# Patient Record
Sex: Female | Born: 1975
Health system: Southern US, Community
[De-identification: ages and names within clinical notes are randomized; demographics above are authoritative.]

## PROBLEM LIST (undated history)

## (undated) DIAGNOSIS — E7800 Pure hypercholesterolemia, unspecified: Secondary | ICD-10-CM

## (undated) DIAGNOSIS — Z8659 Personal history of other mental and behavioral disorders: Secondary | ICD-10-CM

## (undated) DIAGNOSIS — F5089 Other specified eating disorder: Secondary | ICD-10-CM

## (undated) DIAGNOSIS — M719 Bursopathy, unspecified: Secondary | ICD-10-CM

## (undated) DIAGNOSIS — R5383 Other fatigue: Secondary | ICD-10-CM

## (undated) DIAGNOSIS — G43909 Migraine, unspecified, not intractable, without status migrainosus: Secondary | ICD-10-CM

## (undated) DIAGNOSIS — E119 Type 2 diabetes mellitus without complications: Secondary | ICD-10-CM

## (undated) HISTORY — PX: MOUTH SURGERY: SHX715

## (undated) HISTORY — PX: WISDOM TOOTH EXTRACTION: SHX21

## (undated) HISTORY — DX: Other fatigue: R53.83

## (undated) HISTORY — DX: Pure hypercholesterolemia, unspecified: E78.00

## (undated) HISTORY — DX: Migraine, unspecified, not intractable, without status migrainosus: G43.909

## (undated) HISTORY — DX: Other specified eating disorder: F50.89

## (undated) HISTORY — DX: Personal history of other mental and behavioral disorders: Z86.59

## (undated) HISTORY — DX: Type 2 diabetes mellitus without complications: E11.9

---

## 2001-11-12 ENCOUNTER — Other Ambulatory Visit: Admission: RE | Admit: 2001-11-12 | Discharge: 2001-11-12 | Payer: Self-pay | Admitting: Obstetrics & Gynecology

## 2004-05-12 ENCOUNTER — Encounter (INDEPENDENT_AMBULATORY_CARE_PROVIDER_SITE_OTHER): Payer: Self-pay | Admitting: Specialist

## 2004-05-12 ENCOUNTER — Ambulatory Visit (HOSPITAL_COMMUNITY): Admission: RE | Admit: 2004-05-12 | Discharge: 2004-05-12 | Payer: Self-pay | Admitting: Obstetrics & Gynecology

## 2005-03-22 ENCOUNTER — Inpatient Hospital Stay (HOSPITAL_COMMUNITY): Admission: AD | Admit: 2005-03-22 | Discharge: 2005-03-22 | Payer: Self-pay | Admitting: Obstetrics

## 2005-03-22 ENCOUNTER — Ambulatory Visit (HOSPITAL_COMMUNITY): Admission: RE | Admit: 2005-03-22 | Discharge: 2005-03-22 | Payer: Self-pay | Admitting: Obstetrics & Gynecology

## 2005-08-03 ENCOUNTER — Inpatient Hospital Stay (HOSPITAL_COMMUNITY): Admission: AD | Admit: 2005-08-03 | Discharge: 2005-08-06 | Payer: Self-pay | Admitting: Obstetrics & Gynecology

## 2005-08-11 ENCOUNTER — Inpatient Hospital Stay (HOSPITAL_COMMUNITY): Admission: AD | Admit: 2005-08-11 | Discharge: 2005-08-11 | Payer: Self-pay | Admitting: Obstetrics & Gynecology

## 2009-01-14 ENCOUNTER — Ambulatory Visit (HOSPITAL_COMMUNITY): Admission: RE | Admit: 2009-01-14 | Discharge: 2009-01-14 | Payer: Self-pay | Admitting: Obstetrics & Gynecology

## 2009-05-28 ENCOUNTER — Inpatient Hospital Stay (HOSPITAL_COMMUNITY): Admission: AD | Admit: 2009-05-28 | Discharge: 2009-06-01 | Payer: Self-pay | Admitting: Obstetrics & Gynecology

## 2009-05-28 ENCOUNTER — Ambulatory Visit: Payer: Self-pay | Admitting: Obstetrics and Gynecology

## 2009-05-29 ENCOUNTER — Encounter: Payer: Self-pay | Admitting: Obstetrics

## 2009-10-07 ENCOUNTER — Emergency Department (HOSPITAL_COMMUNITY): Admission: EM | Admit: 2009-10-07 | Discharge: 2009-10-07 | Payer: Self-pay | Admitting: Emergency Medicine

## 2010-10-06 LAB — CBC
HCT: 24.3 % — ABNORMAL LOW (ref 36.0–46.0)
HCT: 35.8 % — ABNORMAL LOW (ref 36.0–46.0)
Hemoglobin: 12 g/dL (ref 12.0–15.0)
Hemoglobin: 8.2 g/dL — ABNORMAL LOW (ref 12.0–15.0)
MCHC: 33.5 g/dL (ref 30.0–36.0)
RBC: 2.82 MIL/uL — ABNORMAL LOW (ref 3.87–5.11)
RDW: 14.8 % (ref 11.5–15.5)
WBC: 12.8 10*3/uL — ABNORMAL HIGH (ref 4.0–10.5)

## 2010-10-06 LAB — RPR: RPR Ser Ql: NONREACTIVE

## 2010-11-19 NOTE — Op Note (Signed)
NAME:  NOELA, BROTHERS NO.:  [id number]   MEDICAL RECORD NO.:  [id number]          PATIENT TYPE:  INP   LOCATION:  9167                          FACILITY:  [facility]   PHYSICIAN:  Roseanna Rainbow, M.D.DATE OF BIRTH:  [birth year]   DATE OF PROCEDURE:  08/04/2005  DATE OF DISCHARGE:                                 OPERATIVE REPORT   DELIVERY NOTE:   DELIVERY NOTE:  The patient was fully dilated and pushing, direct OA.  There  was slow crowning.  The head restituted to LOA.  There was difficulty with  delivery of the anterior shoulder.  The patient was placed in [place].  Suprapubic pressure was given as well as a Rubin maneuver was performed.  This was followed by a modified Joseph Art, and there was subsequent delivery of  the anterior shoulder.  Neonatology was called.  The cord was clamped and  cut.  The infant was taken over to the warmer.  The placenta delivered  spontaneously intact with three-vessel cord.  There was a first degree  perineal laceration that was repaired with 3-0 Vicryl Rapide.  The estimated  blood loss was less than 500 mL.  The mother and infant were in stable  condition.      Roseanna Rainbow, M.D.  Electronically Signed     LAJ/MEDQ  D:  08/04/2005  T:  08/04/2005  Job:  [id number]

## 2010-11-19 NOTE — Op Note (Signed)
Sheila James, Sheila James               ACCOUNT NO.:  [id number]   MEDICAL RECORD NO.:  [id number]          PATIENT TYPE:  AMB   LOCATION:  SDC                           FACILITY:  [facility]   PHYSICIAN:  Genia Del, M.D.DATE OF BIRTH:  1976/01/22   DATE OF PROCEDURE:  05/12/2004  DATE OF DISCHARGE:                                 OPERATIVE REPORT   PREOPERATIVE DIAGNOSES:  10 plus weeks nondesired.   POSTOPERATIVE DIAGNOSES:  10 plus weeks nondesired.   INTERVENTION:  Dilatation and evacuation.   SURGEON:  Genia Del, M.D.   ANESTHESIOLOGIST:  Quillian Quince, M.D.   DESCRIPTION OF PROCEDURE:  Under general anesthesia with mask, the patient  is in lithotomy position, she is prepped with Hibiclens in the suprapubic,  vulvar and vaginal areas.  The vaginal exam reveals an anteverted uterus  about 10 weeks, mobile, no adnexal mass. The cervix is closed, no vaginal  bleeding. The speculum is inserted in the vagina, the anterior lip of the  cervix is grasped with a tenaculum, the paracervical block is done with  lidocaine 1%, 20 mL total at 4 and 8 o'clock. We then proceed with  dilatation of the cervix with Hegar dilators up to #33 without difficulty.  We use a #9 curved curette to suction the intrauterine cavity, products of  conception corresponding to about 10 weeks are brought out and sent to  pathology. The sharp curette is then used gently to curettage the entire  intrauterine cavity on all surfaces. The uterine sound is heard. We then go  back with the suction curette to assure complete evacuation of the  intrauterine cavity, products of conception and blood clots are removed. The  uterus contracts well. All instruments are removed. The estimated blood loss  was less than 50 mL, no complication occurred and the patient was brought to  the recovery room in good status. Her blood group with Rh are pending.  If  Rh negative, RhoGAM will be given.      ML/MEDQ  D:   05/12/2004  T:  05/12/2004  Job:  [id number]

## 2010-12-23 ENCOUNTER — Emergency Department (HOSPITAL_COMMUNITY): Payer: Self-pay

## 2010-12-23 ENCOUNTER — Emergency Department (HOSPITAL_COMMUNITY)
Admission: EM | Admit: 2010-12-23 | Discharge: 2010-12-24 | Disposition: A | Discharge status: Home / Self Care | Payer: Self-pay | Attending: Emergency Medicine | Admitting: Emergency Medicine

## 2010-12-23 DIAGNOSIS — M79609 Pain in unspecified limb: Secondary | ICD-10-CM | POA: Insufficient documentation

## 2010-12-23 DIAGNOSIS — M76899 Other specified enthesopathies of unspecified lower limb, excluding foot: Secondary | ICD-10-CM | POA: Insufficient documentation

## 2010-12-23 DIAGNOSIS — M25559 Pain in unspecified hip: Secondary | ICD-10-CM | POA: Insufficient documentation

## 2011-11-11 ENCOUNTER — Emergency Department (HOSPITAL_COMMUNITY): Payer: Self-pay

## 2011-11-11 ENCOUNTER — Encounter (HOSPITAL_COMMUNITY): Payer: Self-pay | Admitting: *Deleted

## 2011-11-11 ENCOUNTER — Emergency Department (HOSPITAL_COMMUNITY)
Admission: EM | Admit: 2011-11-11 | Discharge: 2011-11-12 | Disposition: A | Discharge status: Home / Self Care | Payer: Self-pay | Attending: Emergency Medicine | Admitting: Emergency Medicine

## 2011-11-11 DIAGNOSIS — R269 Unspecified abnormalities of gait and mobility: Secondary | ICD-10-CM | POA: Insufficient documentation

## 2011-11-11 DIAGNOSIS — IMO0002 Reserved for concepts with insufficient information to code with codable children: Secondary | ICD-10-CM | POA: Insufficient documentation

## 2011-11-11 DIAGNOSIS — S93409A Sprain of unspecified ligament of unspecified ankle, initial encounter: Secondary | ICD-10-CM | POA: Insufficient documentation

## 2011-11-11 DIAGNOSIS — M25473 Effusion, unspecified ankle: Secondary | ICD-10-CM | POA: Insufficient documentation

## 2011-11-11 DIAGNOSIS — J45909 Unspecified asthma, uncomplicated: Secondary | ICD-10-CM | POA: Insufficient documentation

## 2011-11-11 DIAGNOSIS — M25579 Pain in unspecified ankle and joints of unspecified foot: Secondary | ICD-10-CM | POA: Insufficient documentation

## 2011-11-11 DIAGNOSIS — M25476 Effusion, unspecified foot: Secondary | ICD-10-CM | POA: Insufficient documentation

## 2011-11-11 HISTORY — DX: Bursopathy, unspecified: M71.9

## 2011-11-11 NOTE — ED Notes (Signed)
Back from xray

## 2011-11-11 NOTE — ED Provider Notes (Signed)
History     CSN: [id number]  Arrival date & time 11/11/11  2059   First MD Initiated Contact with Patient 11/11/11 2210      Chief Complaint  Patient presents with  . Ankle Pain    (Consider location/radiation/quality/duration/timing/severity/associated sxs/prior treatment) HPI Comments: Patient here with right ankle pain s/p fall - states that she initially injured the ankle 2 weeks ago. States that she twisted it and had pain to the right Achilles tendon. States that she babied the ankle at home - states that today while playing with her son, she re-injured the ankle - reports pain and swelling to the posterior portion of the ankle - reports pain with ambulation but is able to ambulate on the ankle - denies numbness or tingling  Patient is a 36 y.o. female presenting with ankle pain. The history is provided by the patient. No language interpreter was used.  Ankle Pain  The incident occurred 3 to 5 hours ago. The incident occurred at home. The injury mechanism was a direct blow. The pain is present in the right ankle. The quality of the pain is described as throbbing. The pain is at a severity of 7/10. The pain is severe. The pain has been constant since onset. Pertinent negatives include no numbness, no inability to bear weight, no loss of motion, no muscle weakness, no loss of sensation and no tingling. She reports no foreign bodies present. The symptoms are aggravated by bearing weight.    Past Medical History  Diagnosis Date  . Bursitis   . Asthma     Past Surgical History  Procedure Date  . Cesarean section   . Mouth surgery     No family history on file.  History  Substance Use Topics  . Smoking status: Never Smoker   . Smokeless tobacco: Not on file  . Alcohol Use: No    OB History    Grav Para Term Preterm Abortions TAB SAB Ect Mult Living                  Review of Systems  Musculoskeletal: Positive for joint swelling, arthralgias and gait problem.    Neurological: Negative for tingling and numbness.  All other systems reviewed and are negative.    Allergies  Review of patient's allergies indicates no known allergies.  Home Medications  No current outpatient prescriptions on file.  BP 117/61  Pulse 78  Temp(Src) 97.7 F (36.5 C) (Oral)  Resp 18  SpO2 100%  LMP 10/14/2011  Physical Exam  Nursing note and vitals reviewed. Constitutional: She is oriented to person, place, and time. She appears well-developed and well-nourished. No distress.  HENT:  Head: Normocephalic and atraumatic.  Right Ear: External ear normal.  Left Ear: External ear normal.  Nose: Nose normal.  Mouth/Throat: Oropharynx is clear and moist. No oropharyngeal exudate.  Eyes: Conjunctivae are normal. Pupils are equal, round, and reactive to light. No scleral icterus.  Neck: Normal range of motion. Neck supple. No tracheal deviation present. No thyromegaly present.  Cardiovascular: Normal rate, regular rhythm and normal heart sounds.  Exam reveals no gallop and no friction rub.   No murmur heard. Pulmonary/Chest: Effort normal and breath sounds normal. No respiratory distress. She has no wheezes. She has no rales. She exhibits no tenderness.  Abdominal: Soft. Bowel sounds are normal. She exhibits no distension. There is no tenderness.  Musculoskeletal:       Right ankle: She exhibits swelling. She exhibits normal range of motion,  no ecchymosis, no deformity and normal pulse. tenderness. Medial malleolus tenderness found. Achilles tendon exhibits pain. Achilles tendon exhibits no defect.       Feet:  Lymphadenopathy:    She has no cervical adenopathy.  Neurological: She is alert and oriented to person, place, and time. No cranial nerve deficit.  Skin: Skin is warm and dry. No rash noted. No erythema. No pallor.  Psychiatric: She has a normal mood and affect. Her behavior is normal. Judgment and thought content normal.    ED Course  Procedures  (including critical care time)  Labs Reviewed - No data to display Dg Ankle Complete Right  11/11/2011  *RADIOLOGY REPORT*  Clinical Data: Twisting injury.  Pain.  RIGHT ANKLE - COMPLETE 3+ VIEW  Comparison: None.  Findings: Imaged bones, joints and soft tissues appear normal.  IMPRESSION: Negative exam.  Original Report Authenticated By: Bernadene Bell. Maricela Curet, M.D.     Right ankle sprain    MDM  Achilles tendon appears intact and patient has intact dorsiflexion and plantar flexion - placed in ASO and on crutches - she will follow up with Dr. Despina Hick.        Izola Price Ali Molina, [place] 11/12/11 0000

## 2011-11-11 NOTE — ED Notes (Signed)
Pt not in room, pt in xray.  

## 2011-11-11 NOTE — ED Notes (Signed)
Ortho tech paged for splint and crutches 

## 2011-11-11 NOTE — ED Notes (Signed)
Pt injured right ankle 2 weeks ago, states "injured achilles tendon".  Tonight, hit on step and now hurts again, difficult to walk on.

## 2011-11-11 NOTE — ED Notes (Signed)
No answer x1

## 2011-11-12 ENCOUNTER — Encounter (HOSPITAL_COMMUNITY): Payer: Self-pay | Admitting: Emergency Medicine

## 2011-11-12 MED ORDER — HYDROCODONE-ACETAMINOPHEN 5-325 MG PO TABS: 1.0000 | ORAL_TABLET | ORAL | Status: AC | PRN

## 2011-11-12 NOTE — ED Provider Notes (Signed)
Medical screening examination/treatment/procedure(s) were performed by non-physician practitioner and as supervising physician I was immediately available for consultation/collaboration.  Jasmine Awe, MD 11/12/11 [phone]

## 2011-11-12 NOTE — Discharge Instructions (Signed)
Ankle Sprain An ankle sprain is an injury to the strong, fibrous tissues (ligaments) that hold the bones of your ankle joint together.  CAUSES Ankle sprain usually is caused by a fall or by twisting your ankle. People who participate in sports are more prone to these types of injuries.  SYMPTOMS  Symptoms of ankle sprain include:  Pain in your ankle. The pain may be present at rest or only when you are trying to stand or walk.   Swelling.   Bruising. Bruising may develop immediately or within 1 to 2 days after your injury.   Difficulty standing or walking.  DIAGNOSIS  Your caregiver will ask you details about your injury and perform a physical exam of your ankle to determine if you have an ankle sprain. During the physical exam, your caregiver will press and squeeze specific areas of your foot and ankle. Your caregiver will try to move your ankle in certain ways. An X-ray exam may be done to be sure a bone was not broken or a ligament did not separate from one of the bones in your ankle (avulsion).  TREATMENT  Certain types of braces can help stabilize your ankle. Your caregiver can make a recommendation for this. Your caregiver may recommend the use of medication for pain. If your sprain is severe, your caregiver may refer you to a surgeon who helps to restore function to parts of your skeletal system (orthopedist) or a physical therapist. HOME CARE INSTRUCTIONS  Apply ice to your injury for 1 to 2 days or as directed by your caregiver. Applying ice helps to reduce inflammation and pain.  Put ice in a plastic bag.   Place a towel between your skin and the bag.   Leave the ice on for 15 to 20 minutes at a time, every 2 hours while you are awake.   Take over-the-counter or prescription medicines for pain, discomfort, or fever only as directed by your caregiver.   Keep your injured leg elevated, when possible, to lessen swelling.   If your caregiver recommends crutches, use them as  instructed. Gradually, put weight on the affected ankle. Continue to use crutches or a cane until you can walk without feeling pain in your ankle.   If you have a plaster splint, wear the splint as directed by your caregiver. Do not rest it on anything harder than a pillow the first 24 hours. Do not put weight on it. Do not get it wet. You may take it off to take a shower or bath.   You may have been given an elastic bandage to wear around your ankle to provide support. If the elastic bandage is too tight (you have numbness or tingling in your foot or your foot becomes cold and blue), adjust the bandage to make it comfortable.   If you have an air splint, you may blow more air into it or let air out to make it more comfortable. You may take your splint off at night and before taking a shower or bath.   Wiggle your toes in the splint several times per day if you are able.  SEEK MEDICAL CARE IF:   You have an increase in bruising, swelling, or pain.   Your toes feel cold.   Pain relief is not achieved with medication.  SEEK IMMEDIATE MEDICAL CARE IF: Your toes are numb or blue or you have severe pain. MAKE SURE YOU:   Understand these instructions.   Will watch your condition.     Will get help right away if you are not doing well or get worse.  Document Released: 06/20/2005 Document Revised: 06/09/2011 Document Reviewed: 01/23/2008 [facility] Patient Information 2012 [place], [place].Ankle Sprain An ankle sprain happens when the bands of tissue that hold the ankle bones together (ligaments) stretch too much and tear.  HOME CARE   Put ice on the ankle for 15 to 20 minutes, 3 to 4 times a day.   Put ice in a plastic bag.   Place a towel between your skin and the bag.   You may stop icing when the puffiness (swelling) goes down.   Raise (elevate) the injured ankle to lessen puffiness.   Use crutches if your doctor tells you to. Use them until you can walk without pain.   If a plaster  splint was applied:   Rest the plaster splint on nothing harder than a pillow for 24 hours.   Do not put weight on it.   Do not get it wet.   Take it off to shower or bathe.   Follow up with your doctor.   Use an elastic wrap for support. Take the wrap off if the toes lose feeling (numb), tingle, or turn cold or blue.   If an air splint was applied:   Add or release air to make it comfortable.   Take it off at night and to shower and bathe.   Wiggle your toes while wearing the air splint.   Only take medicine as told by your doctor.   Do not drive until your doctor says it is okay.  GET HELP RIGHT AWAY IF:   You have more bruising, puffiness, or pain.   Your toes feel cold.   Your medicine does not help lessen your pain.   You are losing feeling in your toes or they turn blue.   You have severe pain.  MAKE SURE YOU:   Understand these instructions.   Will watch your condition.   Will get help right away if you are not doing well or get worse.  Document Released: 12/07/2007 Document Revised: 06/09/2011 Document Reviewed: 12/07/2007 [facility] Patient Information 2012 [address], [place].

## 2014-04-08 ENCOUNTER — Telehealth: Payer: Self-pay | Admitting: *Deleted

## 2014-04-08 NOTE — Telephone Encounter (Signed)
Patient contacted the office requesting a referral for the headache wellness center. Patient states she has a history of chronic headaches. Patient staets she has had a headache daily for the last 3 weeks which is also associated with nausea.  Attempted to contact patient and advised patient would forward request and someone would cont her with the referral appointment.

## 2014-04-09 ENCOUNTER — Other Ambulatory Visit: Payer: Self-pay | Admitting: Obstetrics

## 2014-04-09 ENCOUNTER — Telehealth: Payer: Self-pay

## 2014-04-09 DIAGNOSIS — G44029 Chronic cluster headache, not intractable: Secondary | ICD-10-CM

## 2014-04-09 NOTE — Telephone Encounter (Signed)
atient has no insurance, yet, could not refer her for headaches - she stated she is getting medicaid and will call us

## 2014-04-09 NOTE — Telephone Encounter (Signed)
Referred to Neurology 

## 2014-04-15 NOTE — Telephone Encounter (Signed)
Attempted to contact patient and left message for patient to contact the office.  

## 2014-04-17 ENCOUNTER — Ambulatory Visit: Payer: Self-pay | Admitting: Obstetrics & Gynecology

## 2014-06-30 ENCOUNTER — Encounter: Payer: Self-pay | Admitting: *Deleted

## 2014-07-01 ENCOUNTER — Encounter: Payer: Self-pay | Admitting: Obstetrics & Gynecology

## 2018-01-15 ENCOUNTER — Encounter: Payer: Self-pay | Admitting: *Deleted

## 2018-01-15 ENCOUNTER — Telehealth: Payer: Self-pay | Admitting: *Deleted

## 2018-01-15 ENCOUNTER — Ambulatory Visit: Payer: 59 | Admitting: Neurology

## 2018-01-15 NOTE — Telephone Encounter (Signed)
Pt no showed new pt appt on 01/15/2018 @ 09:00.

## 2018-01-16 ENCOUNTER — Encounter: Payer: Self-pay | Admitting: Neurology

## 2018-05-04 ENCOUNTER — Telehealth: Payer: Self-pay | Admitting: Psychiatry

## 2018-05-04 MED ORDER — LISDEXAMFETAMINE DIMESYLATE 70 MG PO CAPS: 70.0000 mg | ORAL_CAPSULE | Freq: Every day | ORAL | 0 refills | Status: DC

## 2018-05-04 NOTE — Telephone Encounter (Signed)
Pt called to request refill on Vyvanse. She has not been seen since May and will need to make an apt. I can send in script once she has made an apt and we know where to send script. Please contact her with this information at [phone].

## 2018-05-14 ENCOUNTER — Ambulatory Visit: Payer: Self-pay | Admitting: Psychiatry

## 2018-05-18 ENCOUNTER — Ambulatory Visit: Payer: Self-pay | Admitting: Mental Health

## 2018-06-19 ENCOUNTER — Other Ambulatory Visit: Payer: Self-pay | Admitting: Psychiatry

## 2018-06-19 DIAGNOSIS — F902 Attention-deficit hyperactivity disorder, combined type: Secondary | ICD-10-CM

## 2018-06-19 MED ORDER — LISDEXAMFETAMINE DIMESYLATE 70 MG PO CAPS: 70.0000 mg | ORAL_CAPSULE | Freq: Every day | ORAL | 0 refills | Status: DC

## 2018-06-19 NOTE — Progress Notes (Signed)
Pt resting refill.  Patient has appointment scheduled for January 8.  Refill sent.

## 2018-07-05 ENCOUNTER — Encounter: Payer: Self-pay | Admitting: Mental Health

## 2018-07-05 ENCOUNTER — Ambulatory Visit (INDEPENDENT_AMBULATORY_CARE_PROVIDER_SITE_OTHER): Payer: PRIVATE HEALTH INSURANCE | Admitting: Mental Health

## 2018-07-05 DIAGNOSIS — F331 Major depressive disorder, recurrent, moderate: Secondary | ICD-10-CM | POA: Diagnosis not present

## 2018-07-05 NOTE — Progress Notes (Signed)
      [facility] Counselor/Therapist Progress Note  Patient ID: Sheila James, MRN: [id number],    Date: 07/05/2018  Time Spent: minutes  Treatment Type: Individual Therapy  Reported Symptoms: Depressed mood, Anxious Mood, Panic Attacks, Sleep disturbance, Irritability, Fatigue, Sleep disturbance and Appetite disturbance  Mental Status Exam:  Appearance:   Casual     Behavior:  Appropriate, Sharing, Agitated and worried, prreoccupied, stressed  Motor:  Normal  Speech/Language:   Clear and Coherent  Affect:  Depressed  Mood:  anxious, depressed, irritable and sad  Thought process:  normal  Thought content:    WNL  Sensory/Perceptual disturbances:    WNL  Orientation:  oriented to person, place and time/date  Attention:  Negative  Concentration:  Good  Memory:  WNL  Fund of knowledge:   Good  Insight:    Good  Judgment:   Good  Impulse Control:  Good   Risk Assessment: Danger to Self:  No Self-injurious Behavior: No Danger to Others: No Duty to Warn:no Physical Aggression / Violence:No  Access to Firearms a concern: No  Gang Involvement:No   Subjective:  Has been severly depressed and unable to work or get out of bed. Has two school age boys.Owner of condo threw them out because he was selling the condo and they had to stay in low grade motels or sleep in the car was in the washroom of the storage facility. Exhausted,  Interventions: Cognitive Behavioral Therapy, Solution-Oriented/Positive Psychology, Insight-Oriented and Interpersonal  Diagnosis:   ICD-10-CM   1. Major depressive disorder, recurrent episode, moderate (HCC) F33.1     Plan: Self care - nutrition, restful sleep           Work/life balance           Boundaries/ setting limits           Validation and support  Rosary Lively, [facility]

## 2018-07-11 ENCOUNTER — Ambulatory Visit: Payer: Medicaid Other | Admitting: Psychiatry

## 2018-07-11 ENCOUNTER — Encounter: Payer: Self-pay | Admitting: Psychiatry

## 2018-07-11 VITALS — BP 102/73 | HR 73

## 2018-07-11 DIAGNOSIS — F411 Generalized anxiety disorder: Secondary | ICD-10-CM | POA: Diagnosis not present

## 2018-07-11 DIAGNOSIS — F902 Attention-deficit hyperactivity disorder, combined type: Secondary | ICD-10-CM

## 2018-07-11 DIAGNOSIS — F330 Major depressive disorder, recurrent, mild: Secondary | ICD-10-CM | POA: Diagnosis not present

## 2018-07-11 MED ORDER — LISDEXAMFETAMINE DIMESYLATE 70 MG PO CAPS: 70.0000 mg | ORAL_CAPSULE | Freq: Every day | ORAL | 0 refills | Status: DC

## 2018-07-11 MED ORDER — SERTRALINE HCL 100 MG PO TABS: 200.0000 mg | ORAL_TABLET | Freq: Every day | ORAL | 1 refills | Status: DC

## 2018-07-11 MED ORDER — BUSPIRONE HCL 15 MG PO TABS: 15.0000 mg | ORAL_TABLET | Freq: Two times a day (BID) | ORAL | 1 refills | Status: DC

## 2018-07-11 MED ORDER — BUPROPION HCL ER (XL) 150 MG PO TB24: 150.0000 mg | ORAL_TABLET | Freq: Every day | ORAL | 1 refills | Status: DC

## 2018-07-11 NOTE — Progress Notes (Signed)
ATIANNA James [id number] [birth year] 43 y.o.  Subjective:   Patient ID:  Sheila James is a 43 y.o. (DOB 09-27-1975) female.  Chief Complaint:  Chief Complaint  Patient presents with  . Depression  . ADD  . Anxiety    HPI Sheila James presents to the office today for follow-up of Attention Deficit and anxiety. She reports recent stressors with landlord giving them limited notice that they would need to move. Reports that she has not had finances to relocate, so they have been living in a hotel until she has extra funds from tax refund. Reports that she then was no longer able to work from home and had to have childcare. Reports that she started taking medications only on certain days to stretch medications and make them last. She reports that she did not do as well with taking meds less consistently and then had missed work as a result, which had a negative impact on her finances. Reports depression in December that was concerning to her since she had depression while taking medications. Reports that depression is "better now" and continues to have some residual depressive s/s. She reports that her motivation and energy remain low at times and that she had difficulty getting out of bed. Reports that she was recently able to cook, clean, and grocery shop recently after not being able to do so when more depressed. Reports that she has not been getting an adequate amount of sleep due to multiple responsibilities and plans to try to ensure more regular sleep. She reports that appetite has been decreased with Vyvanse. Reports that she was over-eating while depressed. She reports that she tends to experience anxiety and depression simultaneously and that anxiety has been improved with taking medications regularly. She reports that on 2 days she drove to work and was unable to get herself to go into work due to anxiety. She reports taking a picture of herself when experiencing severe  anxiety and noticed she looked "helpless." Denies SI.   She reports that concentration is significantly improved with Vyvanse. Reports that without it, she was starting multiple tasks and not completing them due to distractibility and was procrastinating. She reports that she is now more productive and pro-active and preparing things for her and her sons the week and night before.   Reports that she has found a possible place for them to live near their old neighborhood.   Contemplating going to grad school for healthcare administration.   Review of Systems:  Review of Systems  Musculoskeletal: Negative for gait problem.  Neurological: Negative for tremors.       Reports HA's/migraines are well controlled when taking Sertraline and other medications regularly.   Psychiatric/Behavioral:       Please refer to HPI   No h/o seizures.    Medications: I have reviewed the patient's current medications.  Current Outpatient Medications  Medication Sig Dispense Refill  . Biotin 10 MG TABS Take 1 tablet by mouth daily.    . busPIRone (BUSPAR) 15 MG tablet Take 1 tablet (15 mg total) by mouth 2 (two) times daily. 180 tablet 1  . ferrous sulfate 325 (65 FE) MG tablet Take 325 mg by mouth daily with breakfast.    . [START ON 07/17/2018] lisdexamfetamine (VYVANSE) 70 MG capsule Take 1 capsule (70 mg total) by mouth daily. 30 capsule 0  . Multiple Vitamin (MULTIVITAMIN) tablet Take 1 tablet by mouth daily.    . sertraline (ZOLOFT) 100 MG tablet Take  2 tablets (200 mg total) by mouth daily. 180 tablet 1  . vitamin E 100 UNIT capsule Take 100 Units by mouth daily.    Marland Kitchen buPROPion (WELLBUTRIN XL) 150 MG 24 hr tablet Take 1 tablet (150 mg total) by mouth daily. 30 tablet 1  . [START ON 08/14/2018] lisdexamfetamine (VYVANSE) 70 MG capsule Take 1 capsule (70 mg total) by mouth daily. 30 capsule 0  . [START ON 09/11/2018] lisdexamfetamine (VYVANSE) 70 MG capsule Take 1 capsule (70 mg total) by mouth daily. 30  capsule 0  . NABUMETONE PO Take 750 mg by mouth.    . naratriptan (AMERGE) 2.5 MG tablet Take 2.5 mg by mouth once as needed for migraine. Once a day as needed     No current facility-administered medications for this visit.     Medication Side Effects: Appetite Suppression  Allergies: No Known Allergies  Past Medical History:  Diagnosis Date  . Asthma   . Bursitis   . H/O: depression   . Migraines   . Pica     Family History  Problem Relation Age of Onset  . Diabetes Mother   . Anxiety disorder Mother   . Diabetes Father   . Hypertension Father   . Asthma Brother   . ADD / ADHD Brother   . Diabetes Brother   . Anxiety disorder Maternal Aunt   . Anxiety disorder Maternal Aunt   . ADD / ADHD Son     Social History   Socioeconomic History  . Marital status: Divorced    Spouse name: Not on file  . Number of children: 2  . Years of education: Not on file  . Highest education level: Not on file  Occupational History  . Occupation: Therapist, art  Social Needs  . Financial resource strain: Not on file  . Food insecurity:    Worry: Not on file    Inability: Not on file  . Transportation needs:    Medical: Not on file    Non-medical: Not on file  Tobacco Use  . Smoking status: Never Smoker  . Smokeless tobacco: Never Used  Substance and Sexual Activity  . Alcohol use: No  . Drug use: Never  . Sexual activity: Not Currently    Partners: Male  Lifestyle  . Physical activity:    Days per week: Not on file    Minutes per session: Not on file  . Stress: Not on file  Relationships  . Social connections:    Talks on phone: Not on file    Gets together: Not on file    Attends religious service: Not on file    Active member of club or organization: Not on file    Attends meetings of clubs or organizations: Not on file    Relationship status: Not on file  . Intimate partner violence:    Fear of current or ex partner: Not on file    Emotionally abused: Not on  file    Physically abused: Not on file    Forced sexual activity: Not on file  Other Topics Concern  . Not on file  Social History Narrative  . Not on file    Past Medical History, Surgical history, Social history, and Family history were reviewed and updated as appropriate.   Please see review of systems for further details on the patient's review from today.   Objective:   Physical Exam:  BP 102/73   Pulse 73   Physical Exam Constitutional:  General: She is not in acute distress.    Appearance: She is well-developed.  Musculoskeletal:        General: No deformity.  Neurological:     Mental Status: She is alert and oriented to person, place, and time.     Coordination: Coordination normal.  Psychiatric:        Mood and Affect: Mood is anxious. Mood is not depressed. Affect is not labile, blunt, angry or inappropriate.        Speech: Speech normal.        Behavior: Behavior normal.        Thought Content: Thought content normal. Thought content does not include homicidal or suicidal ideation. Thought content does not include homicidal or suicidal plan.        Judgment: Judgment normal.     Comments: Insight intact. No auditory or visual hallucinations. No delusions.      Lab Review:  No results found for: NA, K, CL, CO2, GLUCOSE, BUN, CREATININE, CALCIUM, PROT, ALBUMIN, AST, ALT, ALKPHOS, BILITOT, GFRNONAA, GFRAA     Component Value Date/Time   WBC 12.8 (H) 05/30/2009 0528   RBC 2.82 (L) 05/30/2009 0528   HGB 8.2 DELTA CHECK NOTED (L) 05/30/2009 0528   HCT 24.3 (L) 05/30/2009 0528   PLT 188 05/30/2009 0528   MCV 86.3 05/30/2009 0528   MCHC 33.8 05/30/2009 0528   RDW 14.8 05/30/2009 0528    No results found for: POCLITH, LITHIUM   No results found for: PHENYTOIN, PHENOBARB, VALPROATE, CBMZ   .res Assessment: Plan:   Discussed potential benefits, risks, and side effects of Wellbutrin XL since patient reports concerned about recurrence of depression since  recent depression was significant and interfered with her ability to work.  Will start Wellbutrin XL 150 mg p.o. every morning for depression. Continue Vyvanse 70 mg p.o. every morning for attention deficit. Continue sertraline 200 mg daily for depression and anxiety. Continue BuSpar 15 mg twice daily for anxiety. Recommend continuing therapy with Rosary Lively, LPC. Attention deficit hyperactivity disorder (ADHD), combined type - Plan: lisdexamfetamine (VYVANSE) 70 MG capsule, lisdexamfetamine (VYVANSE) 70 MG capsule, lisdexamfetamine (VYVANSE) 70 MG capsule  Mild episode of recurrent major depressive disorder (HCC) - Plan: sertraline (ZOLOFT) 100 MG tablet, buPROPion (WELLBUTRIN XL) 150 MG 24 hr tablet  Generalized anxiety disorder - Plan: busPIRone (BUSPAR) 15 MG tablet, sertraline (ZOLOFT) 100 MG tablet  Please see After Visit Summary for patient specific instructions.  Future Appointments  Date Time Provider [place]  07/26/2018  3:30 PM Rosary Lively, [place] [facility] None  08/08/2018 10:00 AM Thayer Headings, PMHNP CP-CP None    No orders of the defined types were placed in this encounter.     -------------------------------

## 2018-07-12 ENCOUNTER — Encounter: Payer: Self-pay | Admitting: Mental Health

## 2018-07-12 ENCOUNTER — Ambulatory Visit (INDEPENDENT_AMBULATORY_CARE_PROVIDER_SITE_OTHER): Payer: PRIVATE HEALTH INSURANCE | Admitting: Mental Health

## 2018-07-12 DIAGNOSIS — F331 Major depressive disorder, recurrent, moderate: Secondary | ICD-10-CM

## 2018-07-12 NOTE — Progress Notes (Signed)
      [facility] Counselor/Therapist Progress Note  Patient ID: Sheila James, MRN: [id number],    Date: 07/12/2018  Time Spent: 45 minutes  Treatment Type: Individual Therapy  Reported Symptoms: Discouraged, depressed, anxious  Mental Status Exam:  Appearance:   Well Groomed     Behavior:  Appropriate, Sharing and Motivated  Motor:  Normal  Speech/Language:   Normal Rate  Affect:  Depressed  Mood:  anxious, depressed and disappointed, discouraged  Thought process:  normal  Thought content:    WNL  Sensory/Perceptual disturbances:    WNL  Orientation:  oriented to person, place and time/date  Attention:  Good  Concentration:  Good  Memory:  WNL  Fund of knowledge:   Good  Insight:    Good  Judgment:   Good  Impulse Control:  Good   Risk Assessment: Danger to Self:  No Self-injurious Behavior: No Danger to Others: No Duty to Warn:no Physical Aggression / Violence:No  Access to Firearms a concern: No  Gang Involvement:No   Subjective:  Concerned about aggressive behavior  of son. Has been discouraged and depressed. Planning to move into renovated condo early February. Has a relationship currently n [place] that offers hope for the future.  Interventions: Cognitive Behavioral Therapy, Solution-Oriented/Positive Psychology, Insight-Oriented and Interpersonal  Diagnosis:   ICD-10-CM   1. Major depressive disorder, recurrent episode, moderate (HCC) F33.1     Plan: Self care program            Parenting issues: Boundaries/ setting limits            Maintain stable mood            Validation and support  Rosary Lively, [facility]

## 2018-07-16 ENCOUNTER — Telehealth: Payer: Self-pay | Admitting: Psychiatry

## 2018-07-16 NOTE — Telephone Encounter (Signed)
Refer to [employer]

## 2018-07-16 NOTE — Telephone Encounter (Signed)
Pt called back and started the Bupropion 150mg  this past Friday, slight itching Friday night. Very itchy starting Saturday. Thought she had a bug bite first then had more. Did not take any medication today.   Please advise

## 2018-07-16 NOTE — Telephone Encounter (Signed)
Left voice mail to call back 

## 2018-07-16 NOTE — Telephone Encounter (Signed)
This is not my patient as far as I know..  Please contact provider. But stop bupropion.  Add Zyrtec BID with prn Benadryl until rash resolves. If severe sx like SOB, Fever then go to ER.

## 2018-07-16 NOTE — Telephone Encounter (Signed)
Attempted to call pt and no answer. Left detailed message for pt to stop Bupropion XL and to start Zyrtec BID and Benadryl prn until rash resolves. Advised her to go to the ER if having any SOB, respiratory s/s, or swelling of lip/mouth. Advised pt to call office with any questions.

## 2018-07-16 NOTE — Telephone Encounter (Signed)
Patient called concerned that she's having allergic reaction to Bupropion.  Whelps and rash.  Also very itchy. Please call

## 2018-07-26 ENCOUNTER — Encounter: Payer: Self-pay | Admitting: Mental Health

## 2018-07-26 ENCOUNTER — Ambulatory Visit (INDEPENDENT_AMBULATORY_CARE_PROVIDER_SITE_OTHER): Payer: PRIVATE HEALTH INSURANCE | Admitting: Mental Health

## 2018-07-26 DIAGNOSIS — F331 Major depressive disorder, recurrent, moderate: Secondary | ICD-10-CM

## 2018-07-26 NOTE — Progress Notes (Signed)
      [facility] Counselor/Therapist Progress Note  Patient ID: Sheila James, MRN: [id number],    Date: 07/26/2018  Time Spent: 45 minutes   Treatment Type: Individual Therapy  Reported Symptoms: Fatigue and Sleep disturbance  Mental Status Exam:  Appearance:   Casual     Behavior:  Appropriate  Motor:  Normal  Speech/Language:   Clear and Coherent  Affect:  Appropriate  Mood:  euthymic  Thought process:  normal  Thought content:    WNL  Sensory/Perceptual disturbances:    WNL  Orientation:  oriented to person, place and time/date  Attention:  Good  Concentration:  Good  Memory:  WNL  Fund of knowledge:   Good  Insight:    Good  Judgment:   Good  Impulse Control:  Good   Risk Assessment: Danger to Self:  No Self-injurious Behavior: No Danger to Others: No Duty to Warn:no Physical Aggression / Violence:No  Access to Firearms a concern: No  Gang Involvement:No   Subjective: Never has personal time for herself. Very vigilent about her 2 sons and works hard at job. Has had headaches. Doing very well on Wellbutrin after initial problems. Memory and concentration better and staying on track at work and home.  Limits screen time. Not sleeping well.  Interventions: Cognitive Behavioral Therapy, Solution-Oriented/Positive Psychology, Insight-Oriented and Interpersonal  Diagnosis:   ICD-10-CM   1. Major depressive disorder, recurrent episode, moderate (HCC) F33.1     Plan:      Self care program: be sure to eat                Restore restful sleep                Work/life balance                Boundaries/ setting limits with sons        Rosary Lively, [facility]

## 2018-08-02 ENCOUNTER — Telehealth: Payer: Self-pay | Admitting: Psychiatry

## 2018-08-02 NOTE — Telephone Encounter (Signed)
Patient stated 2nd round of Wellbutrin is not working. She is itching and can't wait until the 5th to change medication. Please advise.

## 2018-08-02 NOTE — Telephone Encounter (Signed)
Left voicemail to stop medication if not already, take benadryl as needed along with zyrtec. Will notify provider. Has ov 08/08/2018

## 2018-08-08 ENCOUNTER — Telehealth: Payer: Self-pay | Admitting: Psychiatry

## 2018-08-08 ENCOUNTER — Ambulatory Visit: Payer: PRIVATE HEALTH INSURANCE | Admitting: Psychiatry

## 2018-08-08 DIAGNOSIS — F331 Major depressive disorder, recurrent, moderate: Secondary | ICD-10-CM

## 2018-08-08 NOTE — Telephone Encounter (Signed)
Patient is requesting something to be called in to replace her Wellbutrin. Stated it's not working for her. Please call in to the [facility] on [address].

## 2018-08-09 NOTE — Telephone Encounter (Signed)
Left voice mail

## 2018-08-13 NOTE — Telephone Encounter (Signed)
Left voice mail to call back 

## 2018-08-14 MED ORDER — ARIPIPRAZOLE 5 MG PO TABS
5.0000 mg | ORAL_TABLET | Freq: Every day | ORAL | 0 refills | Status: DC
Start: 2018-08-14 — End: 2018-08-28

## 2018-08-14 NOTE — Telephone Encounter (Signed)
See previous phone messages about wellbutrin, she did try it again just to see if she had same reaction and she did. Extreme itching all over.  Put on cxl list today but no OV till 08/27/2018  Asking for something for depression be submitted to [facility] on [address].

## 2018-08-15 NOTE — Telephone Encounter (Signed)
Left voicemail with information. Also working on [place] for aripiprazole

## 2018-08-16 ENCOUNTER — Ambulatory Visit: Payer: PRIVATE HEALTH INSURANCE | Admitting: Mental Health

## 2018-08-20 NOTE — Telephone Encounter (Signed)
Prior authorization approved for aripiprazole 5mg  tablets

## 2018-08-27 ENCOUNTER — Ambulatory Visit: Payer: PRIVATE HEALTH INSURANCE | Admitting: Psychiatry

## 2018-08-27 ENCOUNTER — Encounter

## 2018-08-28 ENCOUNTER — Ambulatory Visit: Payer: PRIVATE HEALTH INSURANCE | Admitting: Psychiatry

## 2018-08-28 ENCOUNTER — Encounter: Payer: Self-pay | Admitting: Psychiatry

## 2018-08-28 VITALS — BP 121/84 | HR 85

## 2018-08-28 DIAGNOSIS — F330 Major depressive disorder, recurrent, mild: Secondary | ICD-10-CM

## 2018-08-28 DIAGNOSIS — F411 Generalized anxiety disorder: Secondary | ICD-10-CM | POA: Diagnosis not present

## 2018-08-28 DIAGNOSIS — F902 Attention-deficit hyperactivity disorder, combined type: Secondary | ICD-10-CM

## 2018-08-28 DIAGNOSIS — F331 Major depressive disorder, recurrent, moderate: Secondary | ICD-10-CM

## 2018-08-28 MED ORDER — LISDEXAMFETAMINE DIMESYLATE 70 MG PO CAPS: 70.0000 mg | ORAL_CAPSULE | Freq: Every day | ORAL | 0 refills | Status: DC

## 2018-08-28 MED ORDER — ARIPIPRAZOLE 5 MG PO TABS: 5.0000 mg | ORAL_TABLET | Freq: Every day | ORAL | 3 refills | Status: DC

## 2018-08-28 NOTE — Progress Notes (Signed)
[name] [id number] Mar 04, 1976 43 y.o.  Subjective:   Patient ID:  Sheila James is a 43 y.o. (DOB Nov 23, 1975) female.  Chief Complaint:  Chief Complaint  Patient presents with  . Depression  . ADD  . Follow-up    h/o anxiety    HPI [name] presents to the office today for follow-up of depression, anxiety, and ADD. Wellbutrin XL was started at last visit and had allergic reaction to include hives and severe itching. She contacted office and was advised to stop Wellbutrin XL, start antihistamines, and seek medical care if having worsening s/s. Reports that she stopped Wellbutrin XL and later re-started to confirm that reaction was related to Wellbutrin XL and she then developed hives and itching again and stopped Wellbutrin XL. Pt contacted office to request another medication for tx of depression due to severe depression that was interfering with her ability to work due to severely low energy and motivation. She reports at that time she was sleeping 1.5-3 hours around that time.   Reports that she started Abilify on 08/15/18 "and I think it is working fine. I don't feel depressed." She has had some recent cold s/s that have interfered with her sleep. She was able to get more sleep last weekend when her children went to stay with her cousin. She reports that her sleep has improved since starting Abilify. Reports some recent anxiety in response to stressors. Denies any recent panic attacks. Reports that she had increased anxiety with return to work.  Reports that she had concentration difficulties last week and was losing track of her thoughts and what she was just working on and needed to do. She reports some improvement in concentration this week and thinks that concentration issues are related to stress, feeling overwhelmed by her responsibilities, and trying to get organized. She reports that overall she is more organized and has been able to prioritize finances.  Reports improved energy and motivation and was able to be more productive last weekend. Some increase in appetite. Denies SI.   Recent financial stress with a paycheck being several hundred less than she had expected. She and her children have recently been sick. Has started trying to do some yoga and meditation. Reports that she is trying to work on self-care. Both of her sons are with her all the time now and they are frequently seeking attention.   Past Psychiatric Medication Trials: abilify- helpful for depression. Wellbutrin- allergic reaction.  Sertraline Buspar Adderall XR Vyvanse  Review of Systems:  Review of Systems  HENT: Positive for congestion and postnasal drip.        Recent URI  Musculoskeletal: Negative for gait problem.  Neurological: Negative for tremors.    Medications: I have reviewed the patient's current medications.  Current Outpatient Medications  Medication Sig Dispense Refill  . ARIPiprazole (ABILIFY) 5 MG tablet Take 1 tablet (5 mg total) by mouth daily for 30 days. 30 tablet 3  . Biotin 10 MG TABS Take 1 tablet by mouth daily.    . busPIRone (BUSPAR) 15 MG tablet Take 1 tablet (15 mg total) by mouth 2 (two) times daily. 180 tablet 1  . ferrous sulfate 325 (65 FE) MG tablet Take 325 mg by mouth daily with breakfast.    . lisdexamfetamine (VYVANSE) 70 MG capsule Take 1 capsule (70 mg total) by mouth daily. 30 capsule 0  . [START ON 09/11/2018] lisdexamfetamine (VYVANSE) 70 MG capsule Take 1 capsule (70 mg total) by mouth daily.  30 capsule 0  . Multiple Vitamin (MULTIVITAMIN) tablet Take 1 tablet by mouth daily.    Marland Kitchen NABUMETONE PO Take 750 mg by mouth.    . naratriptan (AMERGE) 2.5 MG tablet Take 2.5 mg by mouth once as needed for migraine. Once a day as needed    . sertraline (ZOLOFT) 100 MG tablet Take 2 tablets (200 mg total) by mouth daily. 180 tablet 1  . vitamin E 100 UNIT capsule Take 100 Units by mouth daily.    Derrill Memo ON 10/09/2018]  lisdexamfetamine (VYVANSE) 70 MG capsule Take 1 capsule (70 mg total) by mouth daily for 30 days. 30 capsule 0   No current facility-administered medications for this visit.     Medication Side Effects: Other: Some drowsiness with Abilify and changed admin time to HS  Allergies:  Allergies  Allergen Reactions  . Wellbutrin Xl [Bupropion]     Past Medical History:  Diagnosis Date  . Asthma   . Bursitis   . H/O: depression   . Migraines   . Pica     Family History  Problem Relation Age of Onset  . Diabetes Mother   . Anxiety disorder Mother   . Diabetes Father   . Hypertension Father   . Asthma Brother   . ADD / ADHD Brother   . Diabetes Brother   . Anxiety disorder Maternal Aunt   . Anxiety disorder Maternal Aunt   . ADD / ADHD Son     Social History   Socioeconomic History  . Marital status: Divorced    Spouse name: Not on file  . Number of children: 2  . Years of education: Not on file  . Highest education level: Not on file  Occupational History  . Occupation: Therapist, art  Social Needs  . Financial resource strain: Not on file  . Food insecurity:    Worry: Not on file    Inability: Not on file  . Transportation needs:    Medical: Not on file    Non-medical: Not on file  Tobacco Use  . Smoking status: Never Smoker  . Smokeless tobacco: Never Used  Substance and Sexual Activity  . Alcohol use: No  . Drug use: Never  . Sexual activity: Not Currently    Partners: Male  Lifestyle  . Physical activity:    Days per week: Not on file    Minutes per session: Not on file  . Stress: Not on file  Relationships  . Social connections:    Talks on phone: Not on file    Gets together: Not on file    Attends religious service: Not on file    Active member of club or organization: Not on file    Attends meetings of clubs or organizations: Not on file    Relationship status: Not on file  . Intimate partner violence:    Fear of current or ex partner: Not  on file    Emotionally abused: Not on file    Physically abused: Not on file    Forced sexual activity: Not on file  Other Topics Concern  . Not on file  Social History Narrative  . Not on file    Past Medical History, Surgical history, Social history, and Family history were reviewed and updated as appropriate.   Please see review of systems for further details on the patient's review from today.   Objective:   Physical Exam:  BP 121/84   Pulse 85   Physical Exam  Constitutional:      General: She is not in acute distress.    Appearance: She is well-developed.  Musculoskeletal:        General: No deformity.  Neurological:     Mental Status: She is alert and oriented to person, place, and time.     Coordination: Coordination normal.  Psychiatric:        Attention and Perception: Attention and perception normal. She does not perceive auditory or visual hallucinations.        Mood and Affect: Mood is not depressed. Affect is not labile, blunt, angry or inappropriate.        Speech: Speech normal.        Behavior: Behavior normal.        Thought Content: Thought content normal. Thought content does not include homicidal or suicidal ideation. Thought content does not include homicidal or suicidal plan.        Cognition and Memory: Cognition and memory normal.        Judgment: Judgment normal.     Comments: Mood presents as slightly anxious. Insight intact. No delusions.      Lab Review:  No results found for: NA, K, CL, CO2, GLUCOSE, BUN, CREATININE, CALCIUM, PROT, ALBUMIN, AST, ALT, ALKPHOS, BILITOT, GFRNONAA, GFRAA     Component Value Date/Time   WBC 12.8 (H) 05/30/2009 0528   RBC 2.82 (L) 05/30/2009 0528   HGB 8.2 DELTA CHECK NOTED (L) 05/30/2009 0528   HCT 24.3 (L) 05/30/2009 0528   PLT 188 05/30/2009 0528   MCV 86.3 05/30/2009 0528   MCHC 33.8 05/30/2009 0528   RDW 14.8 05/30/2009 0528    No results found for: POCLITH, LITHIUM   No results found for:  PHENYTOIN, PHENOBARB, VALPROATE, CBMZ   .res Assessment: Plan:   Discussed potential benefits, risks, and side effects of Abilify. Discussed potential metabolic side effects associated with atypical antipsychotics, as well as potential risk for movement side effects. Advised pt to contact office if movement side effects occur.  Will continue Abilify 5 mg daily since patient reports that this has been significantly helpful for her depressive signs and symptoms. Will continue sertraline 200 mg daily since this has been effective for mood and anxiety. Will continue Vyvanse 70 mg p.o. every morning for attention deficit. Will continue BuSpar 15 mg twice daily for anxiety. Recommend continuing psychotherapy with Rosary Lively, LPC. Patient to follow-up with this provider in 4 weeks or sooner.  Mild episode of recurrent major depressive disorder (HCC)  Attention deficit hyperactivity disorder (ADHD), combined type - Plan: lisdexamfetamine (VYVANSE) 70 MG capsule  Generalized anxiety disorder  Major depressive disorder, recurrent episode, moderate ([place]) - Plan: ARIPiprazole (ABILIFY) 5 MG tablet  Please see After Visit Summary for patient specific instructions.  Future Appointments  Date Time Provider [place]  09/03/2018  9:00 AM Rosary Lively, LPC CP-CP None  09/26/2018  9:00 AM Melvenia Beam, MD [facility] None  09/27/2018  9:30 AM Thayer Headings, PMHNP CP-CP None  09/27/2018 11:00 AM Rosary Lively, LPC CP-CP None    No orders of the defined types were placed in this encounter.     -------------------------------

## 2018-09-03 ENCOUNTER — Ambulatory Visit (INDEPENDENT_AMBULATORY_CARE_PROVIDER_SITE_OTHER): Payer: PRIVATE HEALTH INSURANCE | Admitting: Mental Health

## 2018-09-03 ENCOUNTER — Encounter: Payer: Self-pay | Admitting: Mental Health

## 2018-09-03 DIAGNOSIS — F331 Major depressive disorder, recurrent, moderate: Secondary | ICD-10-CM

## 2018-09-03 NOTE — Progress Notes (Signed)
      [facility] Counselor/Therapist Progress Note  Patient ID: Sheila James, MRN: [id number],    Date: 09/03/2018  Time Spent: 45 minutes  Treatment Type: Individual Therapy  Reported Symptoms: Anxious  when goes into the office. Not able to be productive at home.  Denies feeling depressed.  Sleep I much better. Concerned about weight gain from Abilify. Believes she is gaining five pounds a week from Abilify. Doing a good job with self care. 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  Mental Status Exam:  Appearance:   Casual     Behavior:  Appropriate and Sharing  Motor:  Normal  Speech/Language:   Clear and Coherent  Affect:  Anxious about work  Mood:  euthymic  Thought process:  normal  Thought content:    WNL  Sensory/Perceptual disturbances:    WNL  Orientation:  oriented to person, place and time/date  Attention:  Good  Concentration:  Good  Memory:  WNL  Fund of knowledge:   Good  Insight:    Good  Judgment:   Good  Impulse Control:  Good   Risk Assessment: Danger to Self:  No Self-injurious Behavior: No Danger to Others: No Duty to Warn:no Physical Aggression / Violence:No  Access to Firearms a concern: No  Gang Involvement:No   Subjective:  Euthymic mood. Looking forward to visit from [employer]. Working out of the office for now. Living in Extended Stay [place] for now. Denies depression but reports anxiety at the ofice. Gaining weight on Abilify distresses her.  Interventions: Cognitive Behavioral Therapy, Solution-Oriented/Positive Psychology, Insight-Oriented and Interpersonal  Diagnosis:   ICD-10-CM   1. Major depressive disorder, recurrent episode, moderate (HCC) F33.1     Plan:  Self care program            Work/life balance            Boundaries/ setting limits            Validation and support  Rosary Lively, [facility]

## 2018-09-26 ENCOUNTER — Ambulatory Visit: Payer: Self-pay | Admitting: Neurology

## 2018-09-27 ENCOUNTER — Ambulatory Visit (INDEPENDENT_AMBULATORY_CARE_PROVIDER_SITE_OTHER): Payer: No Typology Code available for payment source | Admitting: Mental Health

## 2018-09-27 ENCOUNTER — Ambulatory Visit: Payer: PRIVATE HEALTH INSURANCE | Admitting: Psychiatry

## 2018-09-27 ENCOUNTER — Other Ambulatory Visit: Payer: Self-pay

## 2018-09-27 DIAGNOSIS — F331 Major depressive disorder, recurrent, moderate: Secondary | ICD-10-CM | POA: Diagnosis not present

## 2018-09-27 DIAGNOSIS — F411 Generalized anxiety disorder: Secondary | ICD-10-CM

## 2018-09-27 NOTE — Addendum Note (Signed)
Addended by: Logan Bores on: 09/27/2018 01:59 PM   Modules accepted: Level of Service

## 2018-09-27 NOTE — Progress Notes (Signed)
[facility] Counselor/Therapist Progress Note  Patient ID: DAN DISSINGER, MRN: [id number],    Date: 09/27/2018    I connected with patient by a video enabled telemedicine application or telephone, with their informed consent, and  verified patient privacy and that I am speaking with the correct person using two identifiers.  I was located at homeand patient at home. Telephone session due to [place].   Time Spent: 50 minutes  Treatment Type: Individual Therapy  Reported Symptoms: Frustrated and stressed. Disappointed that she has to cancel her birthday plans to got to [place] she has been looking forward to because of the pandemic. Trying to manage her two boys with asthma at home.  Mental Status Exam:  Appearance:   unseen - telephone session     Behavior:  Appropriate, Sharing and Motivated  Motor:  unseen - telephone session  Speech/Language:   Pressured  Affect:  stressed  Mood:  anxious and disappointed  Thought process:  normal  Thought content:    WNL  Sensory/Perceptual disturbances:    WNL  Orientation:  oriented to person, place and time/date  Attention:  Good  Concentration:  Good  Memory:  WNL  Fund of knowledge:   Good  Insight:    Good  Judgment:   Good  Impulse Control:  Good   Risk Assessment: Danger to Self:  No Self-injurious Behavior: No Danger to Others: No Duty to Warn:no Physical Aggression / Violence:No  Access to Firearms a concern: No  Gang Involvement:No   Subjective: On short term disability during this week since she has to be at home with her two sons who have asthma.  Then hopefully can return to working at home instead of out of the office. Had to cancel birthday trip to [place] due to Pandemic. Isolated. Trying to maintain hopeful outlook. Staying in Extended Stay until further notice. Has been depressed but trying to reverse that with nutrition. Limited in opportunity to exercise.  Interventions: Cognitive  Behavioral Therapy, Solution-Oriented/Positive Psychology and Insight-Oriented  Diagnosis:  F33.1  Major depression    Treatment Plan   Patient Name:  Sheila James   Date: September 27, 2018   Didactic topic to be discussed:           Anxiety:                   Locus of control                              Work/Life balance           Depression                             Problem-solving                              Relationships                                   Boundaries                                     Coping srategies  Communication                    Recovery from trauma                    Self-care                                     Validation  Other: Single Parenting     Goals:  Patient  1. Maintains mood stabiity:  decreased symptoms of     depression     anxiety  2.   Practices pro-active self-care:   restful sleep, nutrition, exercise, socialization  3.   Effective utilizes boundaries and sets limits  4.   Utliizes coping strategies and problem solving techniques for stress management  5.   Feels accurately heard, understood and validated  Other: Manage single parenting issues      Logan Bores [facility]

## 2018-09-29 NOTE — Progress Notes (Deleted)
Unable to reach pt at time of scheduled tele-visit. Attempted to reach pt x 3 and there was not answer.

## 2018-10-02 NOTE — Progress Notes (Signed)
Unable to reach pt at scheduled time.

## 2018-10-11 ENCOUNTER — Other Ambulatory Visit: Payer: Self-pay

## 2018-10-11 ENCOUNTER — Ambulatory Visit: Payer: PRIVATE HEALTH INSURANCE | Admitting: Mental Health

## 2018-10-11 ENCOUNTER — Ambulatory Visit: Payer: PRIVATE HEALTH INSURANCE | Admitting: Psychiatry

## 2018-11-30 ENCOUNTER — Other Ambulatory Visit: Payer: Self-pay

## 2018-11-30 ENCOUNTER — Telehealth: Payer: Self-pay | Admitting: Psychiatry

## 2018-11-30 DIAGNOSIS — F902 Attention-deficit hyperactivity disorder, combined type: Secondary | ICD-10-CM

## 2018-11-30 NOTE — Telephone Encounter (Signed)
Patent called and would like to have a refill on her vyvanse 70 mg to be sent to [facility] on [address]. She scheduled an appointment for 6/22

## 2018-11-30 NOTE — Telephone Encounter (Signed)
Pended for approval.

## 2018-12-03 MED ORDER — LISDEXAMFETAMINE DIMESYLATE 70 MG PO CAPS: 70.0000 mg | ORAL_CAPSULE | Freq: Every day | ORAL | 0 refills | Status: DC

## 2018-12-07 ENCOUNTER — Telehealth: Payer: Self-pay | Admitting: Psychiatry

## 2018-12-07 NOTE — Telephone Encounter (Signed)
Does not like new medication, for depression, and it made her gain considerable amount of weight. Please advise if can try something else until next appt

## 2018-12-10 NOTE — Telephone Encounter (Signed)
Left pt. A vm to return my call.

## 2018-12-11 NOTE — Telephone Encounter (Signed)
Left VM again today to return my call.

## 2018-12-13 NOTE — Telephone Encounter (Signed)
Still unable to reach pt. Left VM to return my call.

## 2018-12-24 ENCOUNTER — Ambulatory Visit: Payer: No Typology Code available for payment source | Admitting: Psychiatry

## 2018-12-28 ENCOUNTER — Ambulatory Visit (INDEPENDENT_AMBULATORY_CARE_PROVIDER_SITE_OTHER): Payer: No Typology Code available for payment source | Admitting: Psychiatry

## 2018-12-28 ENCOUNTER — Encounter: Payer: Self-pay | Admitting: Psychiatry

## 2018-12-28 DIAGNOSIS — F330 Major depressive disorder, recurrent, mild: Secondary | ICD-10-CM

## 2018-12-28 DIAGNOSIS — F902 Attention-deficit hyperactivity disorder, combined type: Secondary | ICD-10-CM | POA: Diagnosis not present

## 2018-12-28 DIAGNOSIS — F411 Generalized anxiety disorder: Secondary | ICD-10-CM | POA: Diagnosis not present

## 2018-12-28 MED ORDER — LISDEXAMFETAMINE DIMESYLATE 70 MG PO CAPS: 70.0000 mg | ORAL_CAPSULE | Freq: Every day | ORAL | 0 refills | Status: DC

## 2018-12-28 MED ORDER — SERTRALINE HCL 100 MG PO TABS: 200.0000 mg | ORAL_TABLET | Freq: Every day | ORAL | 1 refills | Status: DC

## 2018-12-28 MED ORDER — LAMOTRIGINE 25 MG PO TABS: ORAL_TABLET | ORAL | 0 refills | Status: DC

## 2018-12-28 MED ORDER — LISDEXAMFETAMINE DIMESYLATE 70 MG PO CAPS
70.0000 mg | ORAL_CAPSULE | Freq: Every day | ORAL | 0 refills | Status: DC
Start: 2019-01-02 — End: 2019-04-10

## 2018-12-28 MED ORDER — BUSPIRONE HCL 15 MG PO TABS: 15.0000 mg | ORAL_TABLET | Freq: Two times a day (BID) | ORAL | 1 refills | Status: DC

## 2018-12-28 NOTE — Progress Notes (Signed)
Sheila James [id number] August 25, 1975 43 y.o.  Virtual Visit via Telephone Note  I connected with pt on 12/28/18 at  8:00 AM EDT by telephone and verified that I am speaking with the correct person using two identifiers.   I discussed the limitations, risks, security and privacy concerns of performing an evaluation and management service by telephone and the availability of in person appointments. I also discussed with the patient that there may be a patient responsible charge related to this service. The patient expressed understanding and agreed to proceed.   I discussed the assessment and treatment plan with the patient. The patient was provided an opportunity to ask questions and all were answered. The patient agreed with the plan and demonstrated an understanding of the instructions.   The patient was advised to call back or seek an in-person evaluation if the symptoms worsen or if the condition fails to improve as anticipated.  I provided 30 minutes of non-face-to-face time during this encounter.  The patient was located at home.  The provider was located at [place].   Thayer Headings, PMHNP   Subjective:   Patient ID:  Sheila James is a 43 y.o. (DOB 1975-09-09) female.  Chief Complaint:  Chief Complaint  Patient presents with  . ADD  . Follow-up    h/o Depression and anxiety    HPI Sheila James presents for follow-up of mood, anxiety, and ADD. She reports that she is doing much better now. She reports that there were some challenges with home schooling her children. She reports that she gained weight with Abilify and was craving sweets. She reports that she stopped Abilify due to wt gain. She reports that she has occasionally taken 1/2 tab of Abilify on more depressed days. She reports that she has "dips" in mood around menses. She notices that exercise has been helpful for her mood. She reports that she has been able to be more compliant  with medication recently and that this has been helpful for mood s/s. She reports that her energy and motivation were lower when she forgot to take her medications. She reports that otherwise her energy and motivation have been ok. She reports that she has been sleeping and adequate amount. "My anxiety is fine because I am taking my medicine." Reports that she has not had anxiety about leaving home. She reports difficulty with concentration at times and describes distractibility. Reports that focus is better compared to the past but not as good as it was compared to a year ago. Denies SI.  She reports that her sons have connected with their father and this has help with their mood and behavior.   Reports that she has been trying to increase activity and eat healthier foods.   Past Psychiatric Medication Trials: abilify- helpful for depression. Wt. Gain Wellbutrin- allergic reaction.  Sertraline Buspar Adderall XR Vyvanse   Review of Systems:  Review of Systems  Musculoskeletal: Positive for back pain. Negative for gait problem.  Neurological: Negative for tremors.       Reports that she has not had any recent migraines.  Psychiatric/Behavioral:       Please refer to HPI    Medications: I have reviewed the patient's current medications.  Current Outpatient Medications  Medication Sig Dispense Refill  . Biotin 10 MG TABS Take 1 tablet by mouth daily.    . ferrous sulfate 325 (65 FE) MG tablet Take 325 mg by mouth daily with breakfast.    . Derrill Memo  ON 01/30/2019] lisdexamfetamine (VYVANSE) 70 MG capsule Take 1 capsule (70 mg total) by mouth daily. 30 capsule 0  . Multiple Vitamin (MULTIVITAMIN) tablet Take 1 tablet by mouth daily.    . vitamin E 100 UNIT capsule Take 100 Units by mouth daily.    . busPIRone (BUSPAR) 15 MG tablet Take 1 tablet (15 mg total) by mouth 2 (two) times daily. 180 tablet 1  . lamoTRIgine (LAMICTAL) 25 MG tablet Take 1 tablet (25 mg total) by mouth daily for 14  days, THEN 2 tablets (50 mg total) daily for 14 days. 45 tablet 0  . [START ON 02/27/2019] lisdexamfetamine (VYVANSE) 70 MG capsule Take 1 capsule (70 mg total) by mouth daily for 30 days. 30 capsule 0  . [START ON 01/02/2019] lisdexamfetamine (VYVANSE) 70 MG capsule Take 1 capsule (70 mg total) by mouth daily for 30 days. 30 capsule 0  . NABUMETONE PO Take 750 mg by mouth.    . naratriptan (AMERGE) 2.5 MG tablet Take 2.5 mg by mouth once as needed for migraine. Once a day as needed    . sertraline (ZOLOFT) 100 MG tablet Take 2 tablets (200 mg total) by mouth daily. 180 tablet 1   No current facility-administered medications for this visit.     Medication Side Effects: None  Allergies:  Allergies  Allergen Reactions  . Wellbutrin Xl [Bupropion]     Past Medical History:  Diagnosis Date  . Asthma   . Bursitis   . H/O: depression   . Migraines   . Pica     Family History  Problem Relation Age of Onset  . Diabetes Mother   . Anxiety disorder Mother   . Diabetes Father   . Hypertension Father   . Asthma Brother   . ADD / ADHD Brother   . Diabetes Brother   . Anxiety disorder Maternal Aunt   . Anxiety disorder Maternal Aunt   . ADD / ADHD Son     Social History   Socioeconomic History  . Marital status: Divorced    Spouse name: Not on file  . Number of children: 2  . Years of education: Not on file  . Highest education level: Not on file  Occupational History  . Occupation: Therapist, art  Social Needs  . Financial resource strain: Not on file  . Food insecurity    Worry: Not on file    Inability: Not on file  . Transportation needs    Medical: Not on file    Non-medical: Not on file  Tobacco Use  . Smoking status: Never Smoker  . Smokeless tobacco: Never Used  Substance and Sexual Activity  . Alcohol use: No  . Drug use: Never  . Sexual activity: Not Currently    Partners: Male  Lifestyle  . Physical activity    Days per week: Not on file    Minutes  per session: Not on file  . Stress: Not on file  Relationships  . Social Herbalist on phone: Not on file    Gets together: Not on file    Attends religious service: Not on file    Active member of club or organization: Not on file    Attends meetings of clubs or organizations: Not on file    Relationship status: Not on file  . Intimate partner violence    Fear of current or ex partner: Not on file    Emotionally abused: Not on file  Physically abused: Not on file    Forced sexual activity: Not on file  Other Topics Concern  . Not on file  Social History Narrative  . Not on file    Past Medical History, Surgical history, Social history, and Family history were reviewed and updated as appropriate.   Please see review of systems for further details on the patient's review from today.   Objective:   Physical Exam:  There were no vitals taken for this visit.  Physical Exam Neurological:     Mental Status: She is alert and oriented to person, place, and time.     Cranial Nerves: No dysarthria.  Psychiatric:        Attention and Perception: Attention normal.        Mood and Affect: Mood normal.        Speech: Speech normal.        Behavior: Behavior is cooperative.        Thought Content: Thought content normal. Thought content is not paranoid or delusional. Thought content does not include homicidal or suicidal ideation. Thought content does not include homicidal or suicidal plan.        Cognition and Memory: Cognition and memory normal.        Judgment: Judgment normal.     Lab Review:  No results found for: NA, K, CL, CO2, GLUCOSE, BUN, CREATININE, CALCIUM, PROT, ALBUMIN, AST, ALT, ALKPHOS, BILITOT, GFRNONAA, GFRAA     Component Value Date/Time   WBC 12.8 (H) 05/30/2009 0528   RBC 2.82 (L) 05/30/2009 0528   HGB 8.2 DELTA CHECK NOTED (L) 05/30/2009 0528   HCT 24.3 (L) 05/30/2009 0528   PLT 188 05/30/2009 0528   MCV 86.3 05/30/2009 0528   MCHC 33.8  05/30/2009 0528   RDW 14.8 05/30/2009 0528    No results found for: POCLITH, LITHIUM   No results found for: PHENYTOIN, PHENOBARB, VALPROATE, CBMZ   .res Assessment: Plan:   Patient request medication for augmentation of depression since she reports episodes of depression at times.  Patient reports that she would like to continue sertraline since this has been very effective for her anxiety and therefore would like a medication to augment depression in combination with sertraline.  Discussed potential benefits, risk, and side effects of possible options for augmentation of depression to include Lamictal and lithium, and since patient had intolerable side effects with Abilify and and allergic reaction to Wellbutrin XL. Counseled patient regarding potential benefits, risks, and side effects of Lamictal to include potential risk of Stevens-Johnson syndrome. Advised patient to stop taking Lamictal and contact office immediately if rash develops and to seek urgent medical attention if rash is severe and/or spreading quickly.  Patient agrees to trial of Lamictal.  Will start Lamictal 25 mg daily for 2 weeks, then increase to 50 mg daily to improve depressive episodes. Will continue all other medications as prescribed. Patient to follow-up in 4 weeks or sooner if clinically indicated. Patient advised to contact office with any questions, adverse effects, or acute worsening in signs and symptoms.   Melisa was seen today for add and follow-up.  Diagnoses and all orders for this visit:  Attention deficit hyperactivity disorder (ADHD), combined type -     lisdexamfetamine (VYVANSE) 70 MG capsule; Take 1 capsule (70 mg total) by mouth daily for 30 days. -     lisdexamfetamine (VYVANSE) 70 MG capsule; Take 1 capsule (70 mg total) by mouth daily. -     lisdexamfetamine (VYVANSE) 70 MG  capsule; Take 1 capsule (70 mg total) by mouth daily for 30 days.  Mild episode of recurrent major depressive disorder  (HCC) -     sertraline (ZOLOFT) 100 MG tablet; Take 2 tablets (200 mg total) by mouth daily. -     lamoTRIgine (LAMICTAL) 25 MG tablet; Take 1 tablet (25 mg total) by mouth daily for 14 days, THEN 2 tablets (50 mg total) daily for 14 days.  Generalized anxiety disorder -     sertraline (ZOLOFT) 100 MG tablet; Take 2 tablets (200 mg total) by mouth daily. -     busPIRone (BUSPAR) 15 MG tablet; Take 1 tablet (15 mg total) by mouth 2 (two) times daily.    Please see After Visit Summary for patient specific instructions.  No future appointments.  No orders of the defined types were placed in this encounter.     -------------------------------

## 2018-12-28 NOTE — Patient Instructions (Addendum)
Monitor for any sign of rash. Please contact office immediately if rash develops. Recommend seeking urgent medical attention if rash is severe and/or spreading quickly.

## 2019-02-01 ENCOUNTER — Other Ambulatory Visit: Payer: Self-pay | Admitting: Psychiatry

## 2019-02-01 DIAGNOSIS — F330 Major depressive disorder, recurrent, mild: Secondary | ICD-10-CM

## 2019-03-13 ENCOUNTER — Other Ambulatory Visit: Payer: Self-pay | Admitting: Psychiatry

## 2019-03-13 DIAGNOSIS — F330 Major depressive disorder, recurrent, mild: Secondary | ICD-10-CM

## 2019-03-13 NOTE — Telephone Encounter (Signed)
No follow up scheduled yet.

## 2019-04-10 ENCOUNTER — Encounter: Payer: Self-pay | Admitting: Psychiatry

## 2019-04-10 ENCOUNTER — Other Ambulatory Visit: Payer: Self-pay

## 2019-04-10 ENCOUNTER — Ambulatory Visit (INDEPENDENT_AMBULATORY_CARE_PROVIDER_SITE_OTHER): Payer: No Typology Code available for payment source | Admitting: Psychiatry

## 2019-04-10 DIAGNOSIS — F902 Attention-deficit hyperactivity disorder, combined type: Secondary | ICD-10-CM

## 2019-04-10 DIAGNOSIS — F330 Major depressive disorder, recurrent, mild: Secondary | ICD-10-CM | POA: Diagnosis not present

## 2019-04-10 DIAGNOSIS — F411 Generalized anxiety disorder: Secondary | ICD-10-CM | POA: Diagnosis not present

## 2019-04-10 MED ORDER — LISDEXAMFETAMINE DIMESYLATE 70 MG PO CAPS: 70.0000 mg | ORAL_CAPSULE | Freq: Every day | ORAL | 0 refills | Status: DC

## 2019-04-10 MED ORDER — SERTRALINE HCL 100 MG PO TABS: 200.0000 mg | ORAL_TABLET | Freq: Every day | ORAL | 1 refills | Status: DC

## 2019-04-10 MED ORDER — LAMOTRIGINE 25 MG PO TABS: 50.0000 mg | ORAL_TABLET | Freq: Every day | ORAL | 1 refills | Status: DC

## 2019-04-10 MED ORDER — BUSPIRONE HCL 15 MG PO TABS: 15.0000 mg | ORAL_TABLET | Freq: Two times a day (BID) | ORAL | 1 refills | Status: DC

## 2019-04-10 NOTE — Progress Notes (Signed)
Sheila James [id number] 05-Feb-1976 43 y.o.  Subjective:   Patient ID:  Sheila James is a 43 y.o. (DOB [birth year]) female.  Chief Complaint:  Chief Complaint  Patient presents with  . Follow-up    ADD, Anxiety, Depression, and PMDD    HPI Sheila James presents to the office today for follow-up of depression, anxiety, and ADD. She reports that her sons have been doing better overall with school work and behavior. Son is sleeping better at night and she is therefore sleeping longer with less interruptions. She reports weight gain during COVID.   She reports that Vyvanse continues to be effective for her concentration and ADD s/s. Reports that she is more efficient and productive.   She reports that she notices HA, back ache, low energy, lower mood, and irritability at time of ovulation and this continues for 7-10 days. Reports that there were 2 days in August when she experienced severe depression in August that interfered with functioning. Reports that she had several days in July with similar s/s.  She reports that otherwise her mood is stable and does not have depression or irritability the remainder of the month. She reports that anxiety has been well controlled aside from long-standing social anxiety. "She reports I am less stressed" than she has been in months. She reports that she looks for escape routes wherever she goes. 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 Denies SI.   Continues to live in an extended stay hotel.   Past Psychiatric Medication Trials: abilify- helpful for depression. Wt. Gain Wellbutrin- allergic reaction.  Sertraline Buspar Adderall XR Vyvanse   Review of Systems:  Review of Systems  Cardiovascular: Negative for palpitations.  Musculoskeletal: Positive for back pain. Negative for gait problem.       She reports worsening back pain during menstrual cycle.  Skin:  Negative for rash.  Neurological: Negative for tremors.  Psychiatric/Behavioral:       Please refer to HPI    Medications: I have reviewed the patient's current medications.  Current Outpatient Medications  Medication Sig Dispense Refill  . ferrous sulfate 325 (65 FE) MG tablet Take 325 mg by mouth daily with breakfast.    . lamoTRIgine (LAMICTAL) 25 MG tablet Take 2 tablets (50 mg total) by mouth daily. 180 tablet 1  . [START ON 06/17/2019] lisdexamfetamine (VYVANSE) 70 MG capsule Take 1 capsule (70 mg total) by mouth daily. 30 capsule 0  . naproxen sodium (ALEVE) 220 MG tablet Take 220 mg by mouth.    . busPIRone (BUSPAR) 15 MG tablet Take 1 tablet (15 mg total) by mouth 2 (two) times daily. 180 tablet 1  . [START ON 05/20/2019] lisdexamfetamine (VYVANSE) 70 MG capsule Take 1 capsule (70 mg total) by mouth daily. 30 capsule 0  . [START ON 04/22/2019] lisdexamfetamine (VYVANSE) 70 MG capsule Take 1 capsule (70 mg total) by mouth daily. 30 capsule 0  . Multiple Vitamin (MULTIVITAMIN) tablet Take 1 tablet by mouth daily.    Marland Kitchen NABUMETONE PO Take 750 mg by mouth.    . naratriptan (AMERGE) 2.5 MG tablet Take 2.5 mg by mouth once as needed for migraine. Once a day as needed    . sertraline (ZOLOFT) 100 MG tablet Take 2 tablets (200 mg total) by mouth daily. 180 tablet 1  . vitamin E 100 UNIT capsule Take 100 Units by mouth daily.     No current facility-administered medications for this visit.  Medication Side Effects: None  Allergies:  Allergies  Allergen Reactions  . Wellbutrin Xl [Bupropion]     Past Medical History:  Diagnosis Date  . Asthma   . Bursitis   . H/O: depression   . Migraines   . Pica     Family History  Problem Relation Age of Onset  . Diabetes Mother   . Anxiety disorder Mother   . Diabetes Father   . Hypertension Father   . Asthma Brother   . ADD / ADHD Brother   . Diabetes Brother   . Anxiety disorder Maternal Aunt   . Anxiety disorder Maternal  Aunt   . ADD / ADHD Son     Social History   Socioeconomic History  . Marital status: Divorced    Spouse name: Not on file  . Number of children: 2  . Years of education: Not on file  . Highest education level: Not on file  Occupational History  . Occupation: Therapist, art  Social Needs  . Financial resource strain: Not on file  . Food insecurity    Worry: Not on file    Inability: Not on file  . Transportation needs    Medical: Not on file    Non-medical: Not on file  Tobacco Use  . Smoking status: Never Smoker  . Smokeless tobacco: Never Used  Substance and Sexual Activity  . Alcohol use: No  . Drug use: Never  . Sexual activity: Not Currently    Partners: Male  Lifestyle  . Physical activity    Days per week: Not on file    Minutes per session: Not on file  . Stress: Not on file  Relationships  . Social Herbalist on phone: Not on file    Gets together: Not on file    Attends religious service: Not on file    Active member of club or organization: Not on file    Attends meetings of clubs or organizations: Not on file    Relationship status: Not on file  . Intimate partner violence    Fear of current or ex partner: Not on file    Emotionally abused: Not on file    Physically abused: Not on file    Forced sexual activity: Not on file  Other Topics Concern  . Not on file  Social History Narrative  . Not on file    Past Medical History, Surgical history, Social history, and Family history were reviewed and updated as appropriate.   Please see review of systems for further details on the patient's review from today.   Objective:   Physical Exam:  BP (!) 146/90   Pulse 76   Physical Exam Constitutional:      General: She is not in acute distress.    Appearance: She is well-developed.  Musculoskeletal:        General: No deformity.  Neurological:     Mental Status: She is alert and oriented to person, place, and time.     Coordination:  Coordination normal.  Psychiatric:        Attention and Perception: Attention and perception normal. She does not perceive auditory or visual hallucinations.        Mood and Affect: Mood normal. Mood is not anxious or depressed. Affect is not labile, blunt, angry or inappropriate.        Speech: Speech normal.        Behavior: Behavior normal.  Thought Content: Thought content normal. Thought content does not include homicidal or suicidal ideation. Thought content does not include homicidal or suicidal plan.        Cognition and Memory: Cognition and memory normal.        Judgment: Judgment normal.     Comments: Insight intact. No delusions.      Lab Review:  No results found for: NA, K, CL, CO2, GLUCOSE, BUN, CREATININE, CALCIUM, PROT, ALBUMIN, AST, ALT, ALKPHOS, BILITOT, GFRNONAA, GFRAA     Component Value Date/Time   WBC 12.8 (H) 05/30/2009 0528   RBC 2.82 (L) 05/30/2009 0528   HGB 8.2 DELTA CHECK NOTED (L) 05/30/2009 0528   HCT 24.3 (L) 05/30/2009 0528   PLT 188 05/30/2009 0528   MCV 86.3 05/30/2009 0528   MCHC 33.8 05/30/2009 0528   RDW 14.8 05/30/2009 0528    No results found for: POCLITH, LITHIUM   No results found for: PHENYTOIN, PHENOBARB, VALPROATE, CBMZ   .res Assessment: Plan:   Will continue current plan of care since target signs and symptoms are well controlled without any tolerability issues. Patient reports that she has noticed decrease in depressive signs and symptoms since starting Lamictal and has not experienced any recent lengthy depressive episodes.  Will continue current dose of Lamictal and discussed that she is on a lower dose of Lamictal and that dose could be increased if depressive signs and symptoms were to become more frequent or severe. Recommend continuing psychotherapy with Rosary Lively, LPC. Patient to follow-up with this provider in 3 months or sooner if clinically indicated. Patient advised to contact office with any questions,  adverse effects, or acute worsening in signs and symptoms.  Dennys was seen today for follow-up.  Diagnoses and all orders for this visit:  Generalized anxiety disorder -     busPIRone (BUSPAR) 15 MG tablet; Take 1 tablet (15 mg total) by mouth 2 (two) times daily. -     sertraline (ZOLOFT) 100 MG tablet; Take 2 tablets (200 mg total) by mouth daily.  Mild episode of recurrent major depressive disorder (HCC) -     lamoTRIgine (LAMICTAL) 25 MG tablet; Take 2 tablets (50 mg total) by mouth daily. -     sertraline (ZOLOFT) 100 MG tablet; Take 2 tablets (200 mg total) by mouth daily.  Attention deficit hyperactivity disorder (ADHD), combined type -     lisdexamfetamine (VYVANSE) 70 MG capsule; Take 1 capsule (70 mg total) by mouth daily. -     lisdexamfetamine (VYVANSE) 70 MG capsule; Take 1 capsule (70 mg total) by mouth daily. -     lisdexamfetamine (VYVANSE) 70 MG capsule; Take 1 capsule (70 mg total) by mouth daily.     Please see After Visit Summary for patient specific instructions.  No future appointments.  No orders of the defined types were placed in this encounter.   -------------------------------

## 2019-07-11 ENCOUNTER — Ambulatory Visit (INDEPENDENT_AMBULATORY_CARE_PROVIDER_SITE_OTHER): Payer: No Typology Code available for payment source | Admitting: Psychiatry

## 2019-07-11 DIAGNOSIS — Z5329 Procedure and treatment not carried out because of patient's decision for other reasons: Secondary | ICD-10-CM

## 2019-07-11 DIAGNOSIS — Z91199 Patient's noncompliance with other medical treatment and regimen due to unspecified reason: Secondary | ICD-10-CM

## 2019-07-11 NOTE — Progress Notes (Signed)
Sheila James [id number] [birth year] 44 y.o.  Attempted to reach pt x 3 for telehealth visit. No answer. L/M to contact office to reschedule.

## 2019-07-23 ENCOUNTER — Telehealth: Payer: Self-pay | Admitting: Psychiatry

## 2019-07-23 DIAGNOSIS — F902 Attention-deficit hyperactivity disorder, combined type: Secondary | ICD-10-CM

## 2019-07-23 NOTE — Telephone Encounter (Signed)
Pt would like a call back to discuss changing meds so insurance will cover the cost. Refills due now but unable to pay. Please advise.

## 2019-07-24 NOTE — Telephone Encounter (Signed)
Checked status of her PA, should be 24-48 hour response

## 2019-07-25 ENCOUNTER — Telehealth: Payer: Self-pay

## 2019-07-25 MED ORDER — AMPHETAMINE-DEXTROAMPHET ER 25 MG PO CP24: 25.0000 mg | ORAL_CAPSULE | ORAL | 0 refills | Status: DC

## 2019-07-25 MED ORDER — ADDERALL XR 25 MG PO CP24: 25.0000 mg | ORAL_CAPSULE | ORAL | 0 refills | Status: DC

## 2019-07-25 NOTE — Addendum Note (Signed)
Addended by: Sharyl Nimrod on: 07/25/2019 05:44 PM   Modules accepted: Orders

## 2019-07-25 NOTE — Telephone Encounter (Signed)
Opened in error

## 2019-07-25 NOTE — Addendum Note (Signed)
Addended by: Sharyl Nimrod on: 07/25/2019 01:19 PM   Modules accepted: Orders

## 2019-07-26 NOTE — Telephone Encounter (Signed)
Left voicemail that Rx for Adderall XR was submitted to try and to call back with an update

## 2019-08-05 ENCOUNTER — Ambulatory Visit (INDEPENDENT_AMBULATORY_CARE_PROVIDER_SITE_OTHER): Payer: No Typology Code available for payment source | Admitting: Psychiatry

## 2019-08-05 ENCOUNTER — Encounter: Payer: Self-pay | Admitting: Psychiatry

## 2019-08-05 VITALS — Wt 235.0 lb

## 2019-08-05 DIAGNOSIS — F332 Major depressive disorder, recurrent severe without psychotic features: Secondary | ICD-10-CM | POA: Diagnosis not present

## 2019-08-05 DIAGNOSIS — F902 Attention-deficit hyperactivity disorder, combined type: Secondary | ICD-10-CM

## 2019-08-05 DIAGNOSIS — F411 Generalized anxiety disorder: Secondary | ICD-10-CM | POA: Diagnosis not present

## 2019-08-05 DIAGNOSIS — G4700 Insomnia, unspecified: Secondary | ICD-10-CM | POA: Diagnosis not present

## 2019-08-05 MED ORDER — TRAZODONE HCL 100 MG PO TABS: ORAL_TABLET | ORAL | 0 refills | Status: DC

## 2019-08-05 NOTE — Progress Notes (Signed)
Sheila James [id number] Apr 21, 1976 44 y.o.  Virtual Visit via Telephone Note  I connected with pt on 08/05/19 at  9:00 AM EST by telephone and verified that I am speaking with the correct person using two identifiers.   I discussed the limitations, risks, security and privacy concerns of performing an evaluation and management service by telephone and the availability of in person appointments. I also discussed with the patient that there may be a patient responsible charge related to this service. The patient expressed understanding and agreed to proceed.   I discussed the assessment and treatment plan with the patient. The patient was provided an opportunity to ask questions and all were answered. The patient agreed with the plan and demonstrated an understanding of the instructions.   The patient was advised to call back or seek an in-person evaluation if the symptoms worsen or if the condition fails to improve as anticipated.  I provided 30 minutes of non-face-to-face time during this encounter.  The patient was located at home.  The provider was located at [place].   Sheila James, PMHNP   Subjective:   Patient ID:  Sheila James is a 44 y.o. (DOB February 05, 1976) female.  Chief Complaint:  Chief Complaint  Patient presents with  . Insomnia  . Anxiety  . Depression  . ADHD    HPI Sheila James presents for follow-up of depression, anxiety, insomnia, and ADD. She reports that she has recently had worsening depression, anxiety, and insomnia which she attributes to challenges with her sons, helping them with remote learning, and not having an adequate internet connection to work remotely. She reports that at times she is sleeping as little as 2-3 hours of sleep a night and sleep schedule has been altered. She slept about 2 hours last night. She reports that she is making self-care goals since she reports that she has not been able to eat as well  as she would like or preparing meals, not exercising, and not having a consistent schedule. Reports having migraines for about 2-2.5 weeks which she attributes to stress. She reports that her mood has improved since she has not been working and is sleeping more and feeling less overwhelmed. She reports that she had a panic attack and high level anxiety when she was told that she can no longer remotely and when someone did not show that agreed to stay with her children while she went into the office. She reports frequent worry and anxious thoughts with trying to manage everything. She reports that her energy has been low and thinks that she may have had covid earlier this year and has had residual fatigue and SOB. Motivation is low and is frequently having to push herself to do things she needs to do. She reports that she has been over-eating and "eating the wrong foods." Reports that she is trying to start preparing meals again. She reports that she has been having difficulty with concentration and is having to use multiple reminders to keep track of tasks that she needs to complete. Denies any impulsive or risky behavior. Reports that she had one episode of fleeting suicidal thoughts. Denies SI.   Started Adderall XR a few days ago when Vyvanse was not approved for her. She reports that she needs more time to be able to determine response to Adderall XR.   Describes limited psychosocial support.    Past Psychiatric Medication Trials: abilify- helpful for depression.Wt. Gain Wellbutrin- allergic reaction.  Sertraline Buspar Adderall  XR Vyvanse Melatonin- Ineffective  Review of Systems:  Review of Systems  Constitutional: Positive for fatigue.  Respiratory: Positive for shortness of breath.   Musculoskeletal: Negative for gait problem.  Neurological: Positive for headaches.  Psychiatric/Behavioral:       Please refer to HPI    Medications: I have reviewed the patient's current  medications.  Current Outpatient Medications  Medication Sig Dispense Refill  . ADDERALL XR 25 MG 24 hr capsule Take 1 capsule by mouth every morning. 30 capsule 0  . ferrous sulfate 325 (65 FE) MG tablet Take 325 mg by mouth daily with breakfast.    . Multiple Vitamin (MULTIVITAMIN) tablet Take 1 tablet by mouth daily.    . naproxen sodium (ALEVE) 220 MG tablet Take 220 mg by mouth.    . NON FORMULARY "Relief"- Magnesium,Zinc, Rhodola, inositol, berberine, gardenia, banaba extract, et    . vitamin E 100 UNIT capsule Take 100 Units by mouth daily.    . busPIRone (BUSPAR) 15 MG tablet Take 1 tablet (15 mg total) by mouth 2 (two) times daily. 180 tablet 1  . lamoTRIgine (LAMICTAL) 25 MG tablet Take 2 tablets (50 mg total) by mouth daily. 180 tablet 1  . naratriptan (AMERGE) 2.5 MG tablet Take 2.5 mg by mouth once as needed for migraine. Once a day as needed    . sertraline (ZOLOFT) 100 MG tablet Take 2 tablets (200 mg total) by mouth daily. 180 tablet 1  . traZODone (DESYREL) 100 MG tablet Take 1/2-1 tablet po QHS prn insomnia 30 tablet 0   No current facility-administered medications for this visit.    Medication Side Effects: None  Allergies:  Allergies  Allergen Reactions  . Wellbutrin Xl [Bupropion]     Past Medical History:  Diagnosis Date  . Asthma   . Bursitis   . H/O: depression   . Migraines   . Pica     Family History  Problem Relation Age of Onset  . Diabetes Mother   . Anxiety disorder Mother   . Diabetes Father   . Hypertension Father   . Asthma Brother   . ADD / ADHD Brother   . Diabetes Brother   . Anxiety disorder Maternal Aunt   . Anxiety disorder Maternal Aunt   . ADD / ADHD Son     Social History   Socioeconomic History  . Marital status: Divorced    Spouse name: Not on file  . Number of children: 2  . Years of education: Not on file  . Highest education level: Not on file  Occupational History  . Occupation: customer service  Tobacco Use   . Smoking status: Never Smoker  . Smokeless tobacco: Never Used  Substance and Sexual Activity  . Alcohol use: No  . Drug use: Never  . Sexual activity: Not Currently    Partners: Male  Other Topics Concern  . Not on file  Social History Narrative  . Not on file   Social Determinants of Health   Financial Resource Strain:   . Difficulty of Paying Living Expenses: Not on file  Food Insecurity:   . Worried About Charity fundraiser in the Last Year: Not on file  . Ran Out of Food in the Last Year: Not on file  Transportation Needs:   . Lack of Transportation (Medical): Not on file  . Lack of Transportation (Non-Medical): Not on file  Physical Activity:   . Days of Exercise per Week: Not on file  .  Minutes of Exercise per Session: Not on file  Stress:   . Feeling of Stress : Not on file  Social Connections:   . Frequency of Communication with Friends and Family: Not on file  . Frequency of Social Gatherings with Friends and Family: Not on file  . Attends Religious Services: Not on file  . Active Member of Clubs or Organizations: Not on file  . Attends Archivist Meetings: Not on file  . Marital Status: Not on file  Intimate Partner Violence:   . Fear of Current or Ex-Partner: Not on file  . Emotionally Abused: Not on file  . Physically Abused: Not on file  . Sexually Abused: Not on file    Past Medical History, Surgical history, Social history, and Family history were reviewed and updated as appropriate.   Please see review of systems for further details on the patient's review from today.   Objective:   Physical Exam:  Wt 235 lb (106.6 kg)   Physical Exam Neurological:     Mental Status: She is alert and oriented to person, place, and time.     Cranial Nerves: No dysarthria.  Psychiatric:        Attention and Perception: Perception normal. She is inattentive.        Mood and Affect: Mood is anxious and depressed.        Speech: Speech normal.         Behavior: Behavior is cooperative.        Thought Content: Thought content normal. Thought content is not paranoid or delusional. Thought content does not include homicidal or suicidal ideation. Thought content does not include homicidal or suicidal plan.        Cognition and Memory: Cognition and memory normal.        Judgment: Judgment normal.     Comments: Insight intact     Lab Review:  No results found for: NA, K, CL, CO2, GLUCOSE, BUN, CREATININE, CALCIUM, PROT, ALBUMIN, AST, ALT, ALKPHOS, BILITOT, GFRNONAA, GFRAA     Component Value Date/Time   WBC 12.8 (H) 05/30/2009 0528   RBC 2.82 (L) 05/30/2009 0528   HGB 8.2 DELTA CHECK NOTED (L) 05/30/2009 0528   HCT 24.3 (L) 05/30/2009 0528   PLT 188 05/30/2009 0528   MCV 86.3 05/30/2009 0528   MCHC 33.8 05/30/2009 0528   RDW 14.8 05/30/2009 0528    No results found for: POCLITH, LITHIUM   No results found for: PHENYTOIN, PHENOBARB, VALPROATE, CBMZ   .res Assessment: Plan:   Discussed initiating treatment for insomnia since severe insomnia is exacerbating depression, anxiety, and fatigue.  Discussed potential benefits, risks, and side effects of trazodone and patient agrees to trial of trazodone.  Will start trazodone 100 mg 1/2-1 tab p.o. nightly for insomnia. Discussed that more time is needed to determine response to recent start of Adderall XR.  Patient reports that she will set daily reminder to journal response to medication. Discussed potential benefits of therapy and patient reports that she would like to see therapist regularly.  Referral made to [facility], LCSW. Patient to follow-up in 4 weeks or sooner if clinically indicated. Patient advised to contact office with any questions, adverse effects, or acute worsening in signs and symptoms.  Tanesha was seen today for insomnia, anxiety, depression and adhd.  Diagnoses and all orders for this visit:  Insomnia, unspecified type -     traZODone (DESYREL) 100 MG tablet; Take  1/2-1 tablet po QHS prn insomnia  Severe episode of recurrent major depressive disorder, without psychotic features ([place])  Generalized anxiety disorder  Attention deficit hyperactivity disorder (ADHD), combined type    Please see After Visit Summary for patient specific instructions.  No future appointments.  No orders of the defined types were placed in this encounter.     -------------------------------

## 2019-08-15 ENCOUNTER — Ambulatory Visit: Payer: No Typology Code available for payment source | Admitting: Addiction (Substance Use Disorder)

## 2019-08-20 ENCOUNTER — Telehealth: Payer: Self-pay | Admitting: Psychiatry

## 2019-08-20 ENCOUNTER — Encounter: Payer: Self-pay | Admitting: Addiction (Substance Use Disorder)

## 2019-08-20 ENCOUNTER — Ambulatory Visit (INDEPENDENT_AMBULATORY_CARE_PROVIDER_SITE_OTHER): Payer: No Typology Code available for payment source | Admitting: Addiction (Substance Use Disorder)

## 2019-08-20 ENCOUNTER — Other Ambulatory Visit: Payer: Self-pay

## 2019-08-20 DIAGNOSIS — F4323 Adjustment disorder with mixed anxiety and depressed mood: Secondary | ICD-10-CM | POA: Diagnosis not present

## 2019-08-20 NOTE — Progress Notes (Signed)
[facility] Counselor Initial Adult Exam  Name: Sheila James Date: 08/20/2019 MRN: [id number] DOB: 09-Oct-1975 PCP: Default, Provider, MD  Time spent: 10:14-11:00 46 min  Reason for Visit /Presenting Problem: Client came in reporting doing okay and being in a state of learning how to take care of herself. Client reported who she is: a mom of 2 teenage boys with ODD who also have asthma and cant go back to school. Client discussed what its like to have 2 sons who cant behave or take care of doing their school work while she had to work. Client reported needing structure and support in helping her to balance her life. Client reported having a collapse when she cannot problem solve or take care of all of her tasks. Client reported feeling flustered and in a crisis emotionally and physically. Client reported having too much stress related to work struggles and COVID and being able to get on short term medical leave/disability from stress-related health changes. Client also reported having been displaced from her home that she rented because the landlord kicked her out in the middle of COVID and her health/work/children crisis. Client also shared that her son even had to go into inpatient residential txt for regulation and stabilization for his moods. Client reported eventually having an inability to sleep more than an hour at a time. Client reported starting Zoloft 200mg  to help her stabilize in her crisis and reduce panic attacks. Client reported a desire to want to be in a better place mentally to help her prepare to go back to work and structure her private and work environment (for herself and for her 2 sons). Client reported feeling helpless and had fleeting moments of fight and flight and despair. Client reported being in survival mode. Therapist built rapport with client and client asked to come back weekly. Client participated in the treatment planning of their therapy. Client agreed with the plan  and understands what to do if there is a crisis: call 9-1-1 and/or crisis line given by therapist.  Mental Status Exam:   Appearance:   Casual     Behavior:  Sharing and Blaming  Motor:  Restlestness  Speech/Language:   Pressured  Affect:  Labile and Tearful  Mood:  anxious and irritable  Thought process:  circumstantial, flight of ideas, loose associations and tangential  Thought content:    Obsessions  Sensory/Perceptual disturbances:    WNL  Orientation:  x4  Attention:  Fair  Concentration:  Poor  Memory:  WNL  Fund of knowledge:   Good  Insight:    Good  Judgment:   Good  Impulse Control:  Good   Reported Symptoms:  Panic attacks, and low motivation, migraines and back pain, desperation, insomnia.   Risk Assessment: Danger to Self:  No Self-injurious Behavior: No Danger to Others: No Duty to Warn:no Physical Aggression / Violence:No  Access to Firearms a concern: No  Gang Involvement:No  While future psychiatric events cannot be accurately predicted, the patient does not currently require acute inpatient psychiatric care and does not currently meet [facility] involuntary commitment criteria.  Substance Abuse History: Current substance abuse: No     Past Psychiatric History:   Previous psychological history is significant for anxiety Outpatient Providers: Dr Kimberlee Nearing History of Psych Hospitalization: No  Psychological Testing: n/a   Abuse History: Victim of No., n/a   Report needed: No. Victim of Neglect:No. Perpetrator of n/a  Witness / Exposure to Domestic Violence: No   Protective Services Involvement:  No  Witness to Community Violence:  No   Family History:  Family History  Problem Relation Age of Onset  . Diabetes Mother   . Anxiety disorder Mother   . Diabetes Father   . Hypertension Father   . Asthma Brother   . ADD / ADHD Brother   . Diabetes Brother   . Anxiety disorder Maternal Aunt   . Anxiety disorder Maternal Aunt   . 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  Stressors:Health problems Marital or family conflict Occupational concerns  Strengths:  Friends, Hopefulness, Conservator, museum/gallery and Able to Communicate Effectively  Barriers:  single parent   Legal History: Pending legal issue / charges: The patient has no significant history of legal issues. History of legal issue / charges: n/a  Medical History/Surgical History:reviewed Past Medical History:  Diagnosis Date  . Asthma   . Bursitis   . H/O: depression   . Migraines   . Pica     Past Surgical History:  Procedure Laterality Date  . CESAREAN SECTION    . MOUTH SURGERY    . WISDOM TOOTH EXTRACTION      Medications: Current Outpatient Medications  Medication Sig Dispense Refill  . ADDERALL XR 25 MG 24 hr capsule Take 1 capsule by mouth every morning. 30 capsule 0  . busPIRone (BUSPAR) 15 MG tablet Take 1 tablet (15 mg total) by mouth 2 (two) times daily. 180 tablet 1  . ferrous sulfate 325 (65 FE) MG tablet Take 325 mg by mouth daily with breakfast.    . lamoTRIgine (LAMICTAL) 25 MG tablet Take 2 tablets (50 mg total) by mouth daily. 180 tablet 1  . Multiple Vitamin (MULTIVITAMIN) tablet Take 1 tablet by mouth daily.    . naproxen sodium (ALEVE) 220 MG tablet Take 220 mg by mouth.    . naratriptan (AMERGE) 2.5 MG tablet Take 2.5 mg by mouth once as needed for migraine. Once a  day as needed    . NON FORMULARY "Relief"- Magnesium,Zinc, Rhodola, inositol, berberine, gardenia, banaba extract, et    . sertraline (ZOLOFT) 100 MG tablet Take 2 tablets (200 mg total) by mouth daily. 180 tablet 1  . traZODone (DESYREL) 100 MG tablet Take 1/2-1 tablet po QHS prn insomnia 30 tablet 0  . vitamin E 100 UNIT capsule Take 100 Units by mouth daily.     No current facility-administered medications for this visit.    Allergies  Allergen Reactions  . Wellbutrin Xl [Bupropion]    Diagnoses:    ICD-10-CM   1. Adjustment disorder with mixed anxiety and depressed mood  F43.23    Plan of Care:  Client to return for weekly therapy with Sammuel Cooper, therapist, to review again in 6 months.  Client to engage in positive self talk and challenging negative internal ruminations and self talk causing client to be overly anxious and worried using CBT,  on daily practice. Client to engage in mindfulness: ie body scans each eveneing to help process and discharge emotional distress & recognize emotions. Client to utilize BSP (brainspotting) with therapist to help client regulate their anxiety in a somatic- felt body sense way: (ie by working to reduce muscle tension, ruminations, increased heart rate, constant worrying and feeling "hyper" ) by decreasing anxiety by 33% in the next 6 months.  Client to prioritize sleep 8+ hours each week night AEB going to bed by 10pm each night.   Barnie Del, LCSW, LCAS, CCTP, CCS-I, BSP

## 2019-08-20 NOTE — Telephone Encounter (Signed)
Laurin Coder, NP at [facility] called to speak with you about Sheila James.  She wants to discuss some possible med changes.  Please call to discuss this mutual patient.  Her mobile phone is [phone]

## 2019-08-21 NOTE — Telephone Encounter (Signed)
Returned call to Laurin Coder, NP who saw pt on 08/15/19. Pt has reported hot flashes, depression, anxiety, fatigue, and night sweats about 2 weeks prior to onset of menses. Reports that typically she will prescribe sertraline for these types of PMDD signs and symptoms, however patient is currently on max dose and continuing to have significant signs and symptoms.  Provider asked about possible augmentation or switching SSRIs to better control PMDD signs and symptoms.  Discussed considering either Prozac or Lexapro for PMDD signs and symptoms.  Discussed that patient is scheduled for f/u visit in 2 days and offered to address these issues at that time. Will contact provider after visit to discuss plan.

## 2019-08-23 ENCOUNTER — Ambulatory Visit (INDEPENDENT_AMBULATORY_CARE_PROVIDER_SITE_OTHER): Payer: No Typology Code available for payment source | Admitting: Psychiatry

## 2019-08-23 ENCOUNTER — Encounter: Payer: Self-pay | Admitting: Psychiatry

## 2019-08-23 DIAGNOSIS — F330 Major depressive disorder, recurrent, mild: Secondary | ICD-10-CM | POA: Diagnosis not present

## 2019-08-23 DIAGNOSIS — F902 Attention-deficit hyperactivity disorder, combined type: Secondary | ICD-10-CM | POA: Diagnosis not present

## 2019-08-23 DIAGNOSIS — F411 Generalized anxiety disorder: Secondary | ICD-10-CM

## 2019-08-23 MED ORDER — SERTRALINE HCL 100 MG PO TABS: ORAL_TABLET | ORAL | 1 refills | Status: DC

## 2019-08-23 MED ORDER — FLUOXETINE HCL 20 MG PO CAPS: ORAL_CAPSULE | ORAL | 1 refills | Status: DC

## 2019-08-23 MED ORDER — CONCERTA 54 MG PO TBCR: 54.0000 mg | EXTENDED_RELEASE_TABLET | ORAL | 0 refills | Status: DC

## 2019-08-23 NOTE — Progress Notes (Signed)
Sheila James [id number] 09-Feb-1976 44 y.o.  Virtual Visit via Telephone Note  I connected with pt on 08/23/19 at  9:00 AM EST by telephone and verified that I am speaking with the correct person using two identifiers.   I discussed the limitations, risks, security and privacy concerns of performing an evaluation and management service by telephone and the availability of in person appointments. I also discussed with the patient that there may be a patient responsible charge related to this service. The patient expressed understanding and agreed to proceed.   I discussed the assessment and treatment plan with the patient. The patient was provided an opportunity to ask questions and all were answered. The patient agreed with the plan and demonstrated an understanding of the instructions.   The patient was advised to call back or seek an in-person evaluation if the symptoms worsen or if the condition fails to improve as anticipated.  I provided 30 minutes of non-face-to-face time during this encounter.  The patient was located at home.  The provider was located at [place].   Thayer Headings, PMHNP   Subjective:   Patient ID:  Sheila James is a 44 y.o. (DOB 07/12/1975) female.  Chief Complaint:  Chief Complaint  Patient presents with  . Anxiety  . Depression  . ADD    HPI Sheila James presents for follow-up of mood, anxiety, PMDD, and ADD. She reports that she has been having severe HA's, hot flashes, anxiety, irritability, increased appetite, low energy, low motivation, and depression about 2 weeks prior to menses. She reports that these s/s interfere with function for at least 10 days a month. She reports that these s/s begin around ovulation and continue until start of menses. She reports that she "gets very forgetful" before menses and when she was without a stimulant. She reports that Sertraline "works well when I am not at that point in my  cycle." She reports that mood and anxiety s/s are well controlled and manageable between menses and ovulation.  Denies SI.   She reports that Adderall XR is not as effective as Vyvanse. She reports that Adderall XR is only effective for 5-6 hours.   She reports that her sleep is disrupted and this is related to schedule with children.   She plans on making adjustments to her diet to improve iron levels.   Past Psychiatric Medication Trials: abilify- helpful for depression.Wt. Gain Wellbutrin- allergic reaction.  Sertraline Buspar Lamictal- Some improvement in depressive s/s outside of pre-menstrual time Adderall XR Vyvanse Melatonin- Ineffective  Review of Systems:  Review of Systems  Musculoskeletal: Negative for gait problem.  Neurological: Negative for tremors.  Psychiatric/Behavioral:       Please refer to HPI    Medications: I have reviewed the patient's current medications.  Current Outpatient Medications  Medication Sig Dispense Refill  . ADDERALL XR 25 MG 24 hr capsule Take 1 capsule by mouth every morning. 30 capsule 0  . ferrous sulfate 325 (65 FE) MG tablet Take 325 mg by mouth daily with breakfast.    . Multiple Vitamin (MULTIVITAMIN) tablet Take 1 tablet by mouth daily.    . naproxen sodium (ALEVE) 220 MG tablet Take 220 mg by mouth.    . traZODone (DESYREL) 100 MG tablet Take 1/2-1 tablet po QHS prn insomnia 30 tablet 0  . vitamin E 100 UNIT capsule Take 100 Units by mouth daily.    . busPIRone (BUSPAR) 15 MG tablet Take 1 tablet (15 mg total)  by mouth 2 (two) times daily. 180 tablet 1  . CONCERTA 54 MG CR tablet Take 1 tablet (54 mg total) by mouth every morning. 30 tablet 0  . FLUoxetine (PROZAC) 20 MG capsule Take 1 capsule by mouth daily for 17 days, then increase to 2 capsules daily. 60 capsule 1  . lamoTRIgine (LAMICTAL) 25 MG tablet Take 2 tablets (50 mg total) by mouth daily. 180 tablet 1  . nabumetone (RELAFEN) 750 MG tablet Take 750 mg by mouth daily.     . naratriptan (AMERGE) 2.5 MG tablet Take 2.5 mg by mouth once as needed for migraine. Once a day as needed    . NON FORMULARY "Relief"- Magnesium,Zinc, Rhodola, inositol, berberine, gardenia, banaba extract, et    . sertraline (ZOLOFT) 100 MG tablet Take 1.5 tabs daily for 5 days, then 100 mg daily for 5 days, then 50 mg daily for 7 days, then stop. 180 tablet 1   No current facility-administered medications for this visit.    Medication Side Effects: None  Allergies:  Allergies  Allergen Reactions  . Wellbutrin Xl [Bupropion]     Past Medical History:  Diagnosis Date  . Asthma   . Bursitis   . H/O: depression   . Migraines   . Pica     Family History  Problem Relation Age of Onset  . Diabetes Mother   . Anxiety disorder Mother   . Diabetes Father   . Hypertension Father   . Asthma Brother   . ADD / ADHD Brother   . Diabetes Brother   . Anxiety disorder Maternal Aunt   . Anxiety disorder Maternal Aunt   . ADD / ADHD Son     Social History   Socioeconomic History  . Marital status: Divorced    Spouse name: Not on file  . Number of children: 2  . Years of education: Not on file  . Highest education level: Not on file  Occupational History  . Occupation: customer service  Tobacco Use  . Smoking status: Never Smoker  . Smokeless tobacco: Never Used  Substance and Sexual Activity  . Alcohol use: No  . Drug use: Never  . Sexual activity: Not Currently    Partners: Male  Other Topics Concern  . Not on file  Social History Narrative  . Not on file   Social Determinants of Health   Financial Resource Strain:   . Difficulty of Paying Living Expenses: Not on file  Food Insecurity:   . Worried About Charity fundraiser in the Last Year: Not on file  . Ran Out of Food in the Last Year: Not on file  Transportation Needs:   . Lack of Transportation (Medical): Not on file  . Lack of Transportation (Non-Medical): Not on file  Physical Activity:   . Days of  Exercise per Week: Not on file  . Minutes of Exercise per Session: Not on file  Stress:   . Feeling of Stress : Not on file  Social Connections:   . Frequency of Communication with Friends and Family: Not on file  . Frequency of Social Gatherings with Friends and Family: Not on file  . Attends Religious Services: Not on file  . Active Member of Clubs or Organizations: Not on file  . Attends Archivist Meetings: Not on file  . Marital Status: Not on file  Intimate Partner Violence:   . Fear of Current or Ex-Partner: Not on file  . Emotionally Abused: Not on  file  . Physically Abused: Not on file  . Sexually Abused: Not on file    Past Medical History, Surgical history, Social history, and Family history were reviewed and updated as appropriate.   Please see review of systems for further details on the patient's review from today.   Objective:   Physical Exam:  There were no vitals taken for this visit.  Physical Exam Neurological:     Mental Status: She is alert and oriented to person, place, and time.     Cranial Nerves: No dysarthria.  Psychiatric:        Attention and Perception: Perception normal. She is inattentive.        Mood and Affect: Mood is anxious and depressed.        Speech: Speech normal.        Behavior: Behavior is cooperative.        Thought Content: Thought content normal. Thought content is not paranoid or delusional. Thought content does not include homicidal or suicidal ideation. Thought content does not include homicidal or suicidal plan.        Cognition and Memory: Cognition and memory normal.        Judgment: Judgment normal.     Comments: Insight intact     Lab Review:  No results found for: NA, K, CL, CO2, GLUCOSE, BUN, CREATININE, CALCIUM, PROT, ALBUMIN, AST, ALT, ALKPHOS, BILITOT, GFRNONAA, GFRAA     Component Value Date/Time   WBC 12.8 (H) 05/30/2009 0528   RBC 2.82 (L) 05/30/2009 0528   HGB 8.2 DELTA CHECK NOTED (L)  05/30/2009 0528   HCT 24.3 (L) 05/30/2009 0528   PLT 188 05/30/2009 0528   MCV 86.3 05/30/2009 0528   MCHC 33.8 05/30/2009 0528   RDW 14.8 05/30/2009 0528    No results found for: POCLITH, LITHIUM   No results found for: PHENYTOIN, PHENOBARB, VALPROATE, CBMZ   .res Assessment: Plan:   Case staffed with Dr. Clovis Pu. Discussed cross-titration from Sertraline to Prozac since pt is currently on max dose of Sertraline and continues to experience severe PMDD s/s for 2 weeks between ovulation and onset of menses. Discussed potential benefits, risks, and side effects of Prozac and pt agrees to trial of Prozac. Discussed cross-titration from Sertraline to Prozac to minimize risk of discontinuation s/s and side effects.  Will decrease Sertraline to 150 mg qd x 5 days, then 100 mg po qd x 5 days, then 50 mg po qd for 7 days, then stop. Will start Prozac 20 mg po qd x 17 days during taper off of Sertraline, then increase Prozac to 40 mg po qd when Sertraline is discontinued. Discussed that PMDD s/s may be adequately controlled with Prozac 40 mg qd, however she may require increased dosing (ie. 60 mg po qd) for 2 weeks during time frame when she is usually symptomatic if s/s are not fully controlled with taking Prozac 40 mg daily continuously.  Will switch Adderall XR to Concerta since Adderall XR is minimally effective and Concerta is preferred med under her insurance.  Recommend extending leave through 10/24/19 since response to tx will need to be evaluated during time frame following ovulation and more time is needed due to cross-titration of medications and allowing time for medication to become effective.  Recommend continuing psychotherapy with Sammuel Cooper, LCSW.   Sheila James was seen today for anxiety, depression and add.  Diagnoses and all orders for this visit:  Attention deficit hyperactivity disorder (ADHD), combined type -  CONCERTA 54 MG CR tablet; Take 1 tablet (54 mg total) by mouth every  morning.  Mild episode of recurrent major depressive disorder (HCC) -     sertraline (ZOLOFT) 100 MG tablet; Take 1.5 tabs daily for 5 days, then 100 mg daily for 5 days, then 50 mg daily for 7 days, then stop. -     FLUoxetine (PROZAC) 20 MG capsule; Take 1 capsule by mouth daily for 17 days, then increase to 2 capsules daily.  Generalized anxiety disorder -     sertraline (ZOLOFT) 100 MG tablet; Take 1.5 tabs daily for 5 days, then 100 mg daily for 5 days, then 50 mg daily for 7 days, then stop. -     FLUoxetine (PROZAC) 20 MG capsule; Take 1 capsule by mouth daily for 17 days, then increase to 2 capsules daily.    Please see After Visit Summary for patient specific instructions.  Future Appointments  Date Time Provider [address]  08/30/2019  9:00 AM Barnie Del, LCSW [facility] None  09/04/2019  2:00 PM Barnie Del, LCSW [facility] None  09/11/2019  3:00 PM Barnie Del, LCSW [facility] None  09/18/2019  3:00 PM Barnie Del, LCSW [facility] None  09/24/2019  7:30 AM Melvenia Beam, MD [facility] None  09/25/2019  3:00 PM Barnie Del, LCSW [facility] None  10/02/2019  2:00 PM Barnie Del, LCSW [facility] None    No orders of the defined types were placed in this encounter.     -------------------------------

## 2019-08-30 ENCOUNTER — Encounter: Payer: Self-pay | Admitting: Addiction (Substance Use Disorder)

## 2019-08-30 ENCOUNTER — Other Ambulatory Visit: Payer: Self-pay

## 2019-08-30 ENCOUNTER — Ambulatory Visit (INDEPENDENT_AMBULATORY_CARE_PROVIDER_SITE_OTHER): Payer: No Typology Code available for payment source | Admitting: Addiction (Substance Use Disorder)

## 2019-08-30 DIAGNOSIS — F4323 Adjustment disorder with mixed anxiety and depressed mood: Secondary | ICD-10-CM

## 2019-08-30 NOTE — Progress Notes (Signed)
[facility] Counselor/Therapist Progress Note  Patient ID: Sheila James, MRN: [id number],    Date: 08/30/2019  Time Spent: 9:02- 10:00 64min  Treatment Type: Individual Therapy  Reported Symptoms: hopeful for herself physically/mentally and less anxious about work. Still very stressed/anxious about their sons.   Mental Status Exam:  Appearance:   Guarded     Behavior:  Sharing and Minimizing  Motor:  Restlestness  Speech/Language:   Clear and Coherent  Affect:  Labile and Full Range  Mood:  anxious  Thought process:  circumstantial and flight of ideas  Thought content:    Obsessions and Rumination  Sensory/Perceptual disturbances:    WNL  Orientation:  x4  Attention:  Fair  Concentration:  Fair  Memory:  WNL  Fund of knowledge:   Good  Insight:    Fair  Judgment:   Good  Impulse Control:  Good   Risk Assessment: Danger to Self:  No Self-injurious Behavior: No Danger to Others: No Duty to Warn:no Physical Aggression / Violence:No  Access to Firearms a concern: No  Gang Involvement:No   Subjective: Client reported still having issues dealing with life's stressors: ie balance in her life vs work life. Client having ongoing issues with her 2 ODD teenage boys. Client shared that she started a new medication: Prozac and also is still on her medical std leave. Client shared her symptoms of: hopeful for herself physically/mentally and less anxious about work as she takes care of all her personal things before returning to work. Client reported still being very stressed/anxious about her sons because COVID kept them from getting therapy following his in-home therapy program. Client reported a change from feeling like her brain feeling like its going to explode with her thoughts running all over the place to noticing a little more calm body sensations. Client inquired for help about continuing to train herself to feel that calm. Therapist inquired about client's coping  skills and used pscyhoeducation to teach client some skills and demonstrated mindfulness. 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 Therapist used Family systems to discuss with client her relationship with her family members. Client processed details and stories about her mom who is toxic. Client recognized a need to get rid of ties to her mom because she is the only one that gets under the client's skin. Therapist taught DBT skills to continue helping client build distress tolerance skills for helping her not get triggered by her mom. Client made progress by discussing ways she wont let her mom leach on her anymore.   Interventions: Dialectical Behavioral Therapy, Roleplay, Mindfulness Meditation, Motivational Interviewing, Psycho-education/Bibliotherapy and Family Systems  Diagnosis:   ICD-10-CM   1. Adjustment disorder with mixed anxiety and depressed mood  F43.23     Plan of Care: Client to return for weekly therapy with Sammuel Cooper, therapist, to review again in 6 months. Client to engage in positive self talk and challenging negative internal ruminationsand self talk causing client to be overly anxious and worriedusing CBT, on daily practice. Client to engage in mindfulness: ie body scans each eveneing to help process and discharge emotional distress & recognize emotions. Client to utilize BSP (brainspotting) with therapist to help client regulate their anxiety in a somatic- felt body sense way: (ieby working to reducemuscle tension,ruminations,increased heart rate,  constant worrying and feeling"hyper" ) by decreasing anxietyby 33% in the next 6 months. Client to prioritize sleep 8+ hours each week night AEB going to bed by 10pm each night.   Barnie Del, LCSW, LCAS, CCTP, CCS-I,  BSP

## 2019-09-04 ENCOUNTER — Other Ambulatory Visit: Payer: Self-pay

## 2019-09-04 ENCOUNTER — Encounter: Payer: Self-pay | Admitting: Addiction (Substance Use Disorder)

## 2019-09-04 ENCOUNTER — Ambulatory Visit (INDEPENDENT_AMBULATORY_CARE_PROVIDER_SITE_OTHER): Payer: No Typology Code available for payment source | Admitting: Addiction (Substance Use Disorder)

## 2019-09-04 DIAGNOSIS — F411 Generalized anxiety disorder: Secondary | ICD-10-CM

## 2019-09-04 DIAGNOSIS — F4323 Adjustment disorder with mixed anxiety and depressed mood: Secondary | ICD-10-CM | POA: Diagnosis not present

## 2019-09-04 DIAGNOSIS — G4700 Insomnia, unspecified: Secondary | ICD-10-CM

## 2019-09-04 MED ORDER — TRAZODONE HCL 100 MG PO TABS: ORAL_TABLET | ORAL | 0 refills | Status: DC

## 2019-09-04 NOTE — Progress Notes (Signed)
      [facility] Counselor/Therapist Progress Note  Patient ID: Sheila James, MRN: [id number],    Date: 09/04/2019  Time Spent: 2:05-3:00 55 mins  Treatment Type: Individual Therapy  Reported Symptoms: less stressed, relieved, but still anxious.   Mental Status Exam:  Appearance:   Casual     Behavior:  Appropriate  Motor:  Normal  Speech/Language:   Clear and Coherent  Affect:  Labile and Full Range  Mood:  anxious  Thought process:  circumstantial    Thought content:    Obsessions and Rumination  Sensory/Perceptual disturbances:    WNL  Orientation:  x4  Attention:  Fair  Concentration:  Fair  Memory:  WNL  Fund of knowledge:   Good  Insight:    Fair  Judgment:   Good  Impulse Control:  Good   Risk Assessment: Danger to Self:  No Self-injurious Behavior: No Danger to Others: No Duty to Warn:no Physical Aggression / Violence:No  Access to Firearms a concern: No  Gang Involvement:No   Subjective: Client came in and discussed her lifting of her depressed mood as shes changed medications but still having trouble with focusing. Client looking for more relief from her anxiety but overall doing much better she reported. Therpaist used MI to support client and ask further about the wellbeing of her sons' Sheila James that stresses the client. Client reported her sons' improvement but her struggles with her toxic mom who contacts her at times. Client reported not letting her mom take up too much room in her head and not engaging much in conversation with her. Therapist used CBT to continue to validate her thoughts/concerns related to her beliefs about her mom and helped her challenge her negative self talk. Client talked about the attachement issues shes had with her mom because she went into fostercare when she was older, and holds more grudges in her body. Therapist used psychoeducation and family systems to validate the traumas client feels in her body and assist her in observing  family roles.   Interventions: Cognitive Behavioral Therapy, Motivational Interviewing, Psycho-education/Bibliotherapy and Family Systems  Diagnosis:   ICD-10-CM   1. Adjustment disorder with mixed anxiety and depressed mood  F43.23   2. Generalized anxiety disorder  F41.1     Plan of Care: Client to return for weekly therapy with Sheila James, therapist, to review again in 6 months. Client to engage in positive self talk and challenging negative internal ruminationsand self talk causing client to be overly anxious and worriedusing CBT, on daily practice. Client to engage in mindfulness: ie body scans each eveneing to help process and discharge emotional distress & recognize emotions. Client to utilize BSP (brainspotting) with therapist to help client regulate their anxiety in a somatic- felt body sense way: (ieby working to reducemuscle tension,ruminations,increased heart rate, constant worrying and feeling"hyper" ) by decreasing anxietyby 33% in the next 6 months. Client to prioritize sleep 8+ hours each week night AEB going to bed by 10pm each night.   Sheila Del, LCSW, LCAS, CCTP, CCS-I, BSP

## 2019-09-11 ENCOUNTER — Ambulatory Visit: Payer: No Typology Code available for payment source | Admitting: Addiction (Substance Use Disorder)

## 2019-09-18 ENCOUNTER — Other Ambulatory Visit: Payer: Self-pay

## 2019-09-18 ENCOUNTER — Ambulatory Visit (INDEPENDENT_AMBULATORY_CARE_PROVIDER_SITE_OTHER): Payer: No Typology Code available for payment source | Admitting: Addiction (Substance Use Disorder)

## 2019-09-18 DIAGNOSIS — F4323 Adjustment disorder with mixed anxiety and depressed mood: Secondary | ICD-10-CM | POA: Diagnosis not present

## 2019-09-18 NOTE — Progress Notes (Signed)
      [facility] Counselor/Therapist Progress Note  Patient ID: Sheila James, MRN: [id number],    Date: 09/18/2019  Time Spent: 3:05-3:45 66mins  Treatment Type: Individual Therapy  Reported Symptoms: hopeful, less stressed, nausea.   Mental Status Exam:  Appearance:   Well Groomed     Behavior:  Appropriate  Motor:  Normal  Speech/Language:   Clear and Coherent  Affect:  Appropriate and Full Range  Mood:  anxious  Thought process:  circumstantial    Thought content:    Obsessions and Rumination  Sensory/Perceptual disturbances:    WNL  Orientation:  x4  Attention:  Fair  Concentration:  Fair  Memory:  WNL  Fund of knowledge:   Good  Insight:    Fair  Judgment:   Good  Impulse Control:  Good   Risk Assessment: Danger to Self:  No Self-injurious Behavior: No Danger to Others: No Duty to Warn:no Physical Aggression / Violence:No  Access to Firearms a concern: No  Gang Involvement:No   Subjective: Client in processing the changes in her life mentally and with her sons, reporting less stress. Client reported feeling more hopeful about progress she is having with her new MH medications and alpha-stim CES device in managing her stress/anxiety. Client shared her concerns as a parent for her 2 sons and processed goals for them. Therapist used SFT to help client plan more SMART goals. Therapist inquired about client's anxiety and client talked about her over-active alarm system (in her nervous system), always ready to solve a problem. Client shared her reactions to just react because she doesn't like to be out of control. Therapist worked with client to explore the need for control in her life and to look at things in her life she is nervous she doesn't have control of. Therapist also normalized the desire to control things we cant and used CBT to help client comb through things to determine things she is holding onto that are weighing her down or causing more crises in her  life.   Interventions: Cognitive Behavioral Therapy, Motivational Interviewing, Solution-Oriented/Positive Psychology and Family Systems  Diagnosis:   ICD-10-CM   1. Adjustment disorder with mixed anxiety and depressed mood  F43.23     Plan of Care: Client to return for weekly therapy with Sammuel Cooper, therapist, to review again in 6 months. Client to engage in positive self talk and challenging negative internal ruminationsand self talk causing client to be overly anxious and worriedusing CBT, on daily practice. Client to engage in mindfulness: ie body scans each eveneing to help process and discharge emotional distress & recognize emotions. Client to utilize BSP (brainspotting) with therapist to help client regulate their anxiety in a somatic- felt body sense way: (ieby working to reducemuscle tension,ruminations,increased heart rate, constant worrying and feeling"hyper" ) by decreasing anxietyby 33% in the next 6 months. Client to prioritize sleep 8+ hours each week night AEB going to bed by 10pm each night.   Barnie Del, LCSW, LCAS, CCTP, CCS-I, BSP

## 2019-09-23 ENCOUNTER — Ambulatory Visit: Payer: No Typology Code available for payment source | Admitting: Psychiatry

## 2019-09-23 ENCOUNTER — Telehealth: Payer: Self-pay | Admitting: Psychiatry

## 2019-09-23 NOTE — Telephone Encounter (Signed)
Sheila James called wanting the change her 2:45 appt to telehealth, but since we require 24 hr notice to do so, she was rescheduled to 10/10/19.  She is having issues with some of her meds and would like to discuss them.  Please call.

## 2019-09-24 ENCOUNTER — Ambulatory Visit: Payer: Medicaid Other | Admitting: Neurology

## 2019-09-24 ENCOUNTER — Encounter: Payer: Self-pay | Admitting: Neurology

## 2019-09-25 ENCOUNTER — Other Ambulatory Visit: Payer: Self-pay

## 2019-09-25 ENCOUNTER — Ambulatory Visit (INDEPENDENT_AMBULATORY_CARE_PROVIDER_SITE_OTHER): Payer: No Typology Code available for payment source | Admitting: Addiction (Substance Use Disorder)

## 2019-09-25 DIAGNOSIS — F411 Generalized anxiety disorder: Secondary | ICD-10-CM | POA: Diagnosis not present

## 2019-09-25 DIAGNOSIS — G4700 Insomnia, unspecified: Secondary | ICD-10-CM

## 2019-09-25 NOTE — Telephone Encounter (Signed)
Patient has apt now scheduled 09/27/2019

## 2019-09-25 NOTE — Telephone Encounter (Signed)
Noted also working on her short term disability paperwork from [employer]. Requesting office notes from 09/02/2019, but last visit was 08/23/2019. Next apt is scheduled 10/10/2019.

## 2019-09-25 NOTE — Progress Notes (Signed)
      [facility] Counselor/Therapist Progress Note  Patient ID: Sheila James, MRN: [id number],    Date: 09/25/2019  Time Spent: 3:05- 4:05 51mins  Treatment Type: Individual Therapy  Reported Symptoms: insomnia, some anxiety.  Mental Status Exam:  Appearance:   Well Groomed     Behavior:  Appropriate  Motor:  Normal  Speech/Language:   Clear and Coherent  Affect:  Appropriate and Full Range  Mood:  anxious  Thought process:  circumstantial    Thought content:    Obsessions and Rumination  Sensory/Perceptual disturbances:    WNL  Orientation:  x4  Attention:  Fair  Concentration:  Fair  Memory:  WNL  Fund of knowledge:   Good  Insight:    Fair  Judgment:   Good  Impulse Control:  Good   Risk Assessment: Danger to Self:  No Self-injurious Behavior: No Danger to Others: No Duty to Warn:no Physical Aggression / Violence:No  Access to Firearms a concern: No  Gang Involvement:No   Subjective: Client reported feeling shaky and overmedicated with her antidepressant. Client preparing to go back to work in April and is feeling more hopeful. Client struggling with insomnia and stress/anxiety and has taken Trazadone but it made her too drowsy to wake up in the am. Therapist used MI & CBT to help support client in her changes and to help her find roots of the insomnia.   Interventions: Cognitive Behavioral Therapy and Motivational Interviewing  Diagnosis:   ICD-10-CM   1. Generalized anxiety disorder  F41.1   2. Insomnia, unspecified type  G47.00     Plan of Care: Client to return for weekly therapy with Sammuel Cooper, therapist, to review again in 6 months. Client to engage in positive self talk and challenging negative internal ruminationsand self talk causing client to be overly anxious and worriedusing CBT, on daily practice. Client to engage in mindfulness: ie body scans each eveneing to help process and discharge emotional distress & recognize  emotions. Client to utilize BSP (brainspotting) with therapist to help client regulate their anxiety in a somatic- felt body sense way: (ieby working to reducemuscle tension,ruminations,increased heart rate, constant worrying and feeling"hyper" ) by decreasing anxietyby 33% in the next 6 months. Client to prioritize sleep 8+ hours each week night AEB going to bed by 10pm each night.   Barnie Del, LCSW, LCAS, CCTP, CCS-I, BSP

## 2019-09-27 ENCOUNTER — Other Ambulatory Visit: Payer: Self-pay

## 2019-09-27 ENCOUNTER — Ambulatory Visit (INDEPENDENT_AMBULATORY_CARE_PROVIDER_SITE_OTHER): Payer: No Typology Code available for payment source | Admitting: Psychiatry

## 2019-09-27 ENCOUNTER — Encounter: Payer: Self-pay | Admitting: Psychiatry

## 2019-09-27 VITALS — BP 129/78 | HR 66

## 2019-09-27 DIAGNOSIS — F411 Generalized anxiety disorder: Secondary | ICD-10-CM

## 2019-09-27 DIAGNOSIS — G4700 Insomnia, unspecified: Secondary | ICD-10-CM | POA: Diagnosis not present

## 2019-09-27 DIAGNOSIS — F902 Attention-deficit hyperactivity disorder, combined type: Secondary | ICD-10-CM | POA: Diagnosis not present

## 2019-09-27 DIAGNOSIS — F3281 Premenstrual dysphoric disorder: Secondary | ICD-10-CM

## 2019-09-27 DIAGNOSIS — F0634 Mood disorder due to known physiological condition with mixed features: Secondary | ICD-10-CM

## 2019-09-27 MED ORDER — LISDEXAMFETAMINE DIMESYLATE 70 MG PO CAPS: 70.0000 mg | ORAL_CAPSULE | Freq: Every day | ORAL | 0 refills | Status: DC

## 2019-09-27 NOTE — Progress Notes (Signed)
TAMIRACLE DIANNA [id number] 1975/10/21 44 y.o.  Subjective:   Patient ID:  Sheila James is a 44 y.o. (DOB 1975/08/13) female.  Chief Complaint:  Chief Complaint  Patient presents with  . Other    PMDD  . Depression  . Anxiety  . Insomnia    HPI Sheila James presents to the office today for follow-up of anxiety and depression. She reports that PMDD s/s started on 09/01/19 and "I could feel myself becoming less able to do lots of things." She reports that she was feeling "lazy and sluggish."  She reports that she stopped taking all of her medication due to multiple physical symptoms. She reports that she was having nausea and migraines. She reports that she was dizzy, off balance, and forgetting to eat. She reports that she was having difficulty remembering things. Reports that she felt shaky one day. She reports that she is feeling better today and physical s/s have resolved. She reports that she took Buspar 5 mg today after not taking it for several days. Reports that she has not had Prozac since 09/18/19. Reports that cross-titration from Sertraline to Prozac "may have gotten messed up."   She reports that "March was a train wreck." She reports that January-March are typically difficult months for her. She reports that she has not been able to sleep. She reports that she has been taking Trazodone too late and then it causes her to sleep later than she would like. Slept less than 3 hours last night. She overslept Tuesday night after taking Trazodone and slept 10 hours and 35 minutes. Slept 6 hours Monday. Sunday slept less than 4 hours. She reports that she will feel sleepy and then will wake up. Energy fluctuates.  Has been using alpha-stim to help with anxiety. She reports that she has anxiety and irritation when not using the alpha-stim. "I haven't felt depressed." Reports that she has experienced periods of decreased need for sleep. She reports that she has periods  where her energy is higher and has an increase in goal-directed activity. Reports periods of increased ideas. Denies any impulsive or risky behavior. She reports that in early March she was "highly irritated." She reports that she was not sleeping well at that time. She reports that shifts in mood correlate with menstrual cycle. Appetite has been decreased. Denies SI.   She reports that Concerta does not seem to last long enough or have a significant effect. Reports concentration has been impaired and she has not been very productive   Has been trying to set more realistic, attainable goals and tasks to complete. She reports that when she tries to accomplish multiple tasks and handle whatever else comes up that she "burns out." Has stressors with managing sons' school work and behaviors.   Past Psychiatric Medication Trials: abilify- helpful for depression.Wt. Gain Wellbutrin- allergic reaction.  Sertraline Prozac Buspar Lamictal- Some improvement in depressive s/s outside of pre-menstrual time Adderall XR Vyvanse Concerta- Ineffective (short duration and minimal improvement) Melatonin- Ineffective Trazodone  Review of Systems:  Review of Systems  Musculoskeletal: Negative for gait problem.  Neurological: Positive for headaches. Negative for tremors.  Psychiatric/Behavioral:       Please refer to HPI    Medications: I have reviewed the patient's current medications.  Current Outpatient Medications  Medication Sig Dispense Refill  . nabumetone (RELAFEN) 750 MG tablet Take 750 mg by mouth daily.    . ferrous sulfate 325 (65 FE) MG tablet Take 325 mg by mouth  daily with breakfast.    . lisdexamfetamine (VYVANSE) 70 MG capsule Take 1 capsule (70 mg total) by mouth daily. 30 capsule 0  . Multiple Vitamin (MULTIVITAMIN) tablet Take 1 tablet by mouth daily.    . naproxen sodium (ALEVE) 220 MG tablet Take 220 mg by mouth.    . naratriptan (AMERGE) 2.5 MG tablet Take 2.5 mg by mouth once  as needed for migraine. Once a day as needed    . NON FORMULARY "Relief"- Magnesium,Zinc, Rhodola, inositol, berberine, gardenia, banaba extract, et    . traZODone (DESYREL) 100 MG tablet Take 1/2-1 tablet po QHS prn insomnia (Patient not taking: Reported on 09/27/2019) 30 tablet 0  . vitamin E 100 UNIT capsule Take 100 Units by mouth daily.     No current facility-administered medications for this visit.    Medication Side Effects: Other: Possible discontinuation side effects vs. side effects with Prozac  Allergies:  Allergies  Allergen Reactions  . Wellbutrin Xl [Bupropion]     Past Medical History:  Diagnosis Date  . Asthma   . Bursitis   . H/O: depression   . Migraines   . Pica     Family History  Problem Relation Age of Onset  . Diabetes Mother   . Anxiety disorder Mother   . Diabetes Father   . Hypertension Father   . Asthma Brother   . ADD / ADHD Brother   . Diabetes Brother   . Anxiety disorder Maternal Aunt   . Anxiety disorder Maternal Aunt   . ADD / ADHD Son     Social History   Socioeconomic History  . Marital status: Divorced    Spouse name: Not on file  . Number of children: 2  . Years of education: Not on file  . Highest education level: Not on file  Occupational History  . Occupation: customer service  Tobacco Use  . Smoking status: Never Smoker  . Smokeless tobacco: Never Used  Substance and Sexual Activity  . Alcohol use: No  . Drug use: Never  . Sexual activity: Not Currently    Partners: Male  Other Topics Concern  . Not on file  Social History Narrative  . Not on file   Social Determinants of Health   Financial Resource Strain:   . Difficulty of Paying Living Expenses:   Food Insecurity:   . Worried About Charity fundraiser in the Last Year:   . Arboriculturist in the Last Year:   Transportation Needs:   . Film/video editor (Medical):   Marland Kitchen Lack of Transportation (Non-Medical):   Physical Activity:   . Days of Exercise  per Week:   . Minutes of Exercise per Session:   Stress:   . Feeling of Stress :   Social Connections:   . Frequency of Communication with Friends and Family:   . Frequency of Social Gatherings with Friends and Family:   . Attends Religious Services:   . Active Member of Clubs or Organizations:   . Attends Archivist Meetings:   Marland Kitchen Marital Status:   Intimate Partner Violence:   . Fear of Current or Ex-Partner:   . Emotionally Abused:   Marland Kitchen Physically Abused:   . Sexually Abused:     Past Medical History, Surgical history, Social history, and Family history were reviewed and updated as appropriate.   Please see review of systems for further details on the patient's review from today.   Objective:   Physical Exam:  BP 129/78   Pulse 66   Physical Exam Constitutional:      General: She is not in acute distress. Musculoskeletal:        General: No deformity.  Neurological:     Mental Status: She is alert and oriented to person, place, and time.     Coordination: Coordination normal.  Psychiatric:        Attention and Perception: Attention and perception normal. She does not perceive auditory or visual hallucinations.        Mood and Affect: Mood is anxious. Mood is not depressed. Affect is not labile, blunt, angry or inappropriate.        Speech: Speech is rapid and pressured.        Behavior: Behavior is not slowed. Behavior is cooperative.        Thought Content: Thought content normal. Thought content is not paranoid or delusional. Thought content does not include homicidal or suicidal ideation. Thought content does not include homicidal or suicidal plan.        Cognition and Memory: Cognition and memory normal.        Judgment: Judgment normal.     Comments: Insight intact     Lab Review:  No results found for: NA, K, CL, CO2, GLUCOSE, BUN, CREATININE, CALCIUM, PROT, ALBUMIN, AST, ALT, ALKPHOS, BILITOT, GFRNONAA, GFRAA     Component Value Date/Time   WBC  12.8 (H) 05/30/2009 0528   RBC 2.82 (L) 05/30/2009 0528   HGB 8.2 DELTA CHECK NOTED (L) 05/30/2009 0528   HCT 24.3 (L) 05/30/2009 0528   PLT 188 05/30/2009 0528   MCV 86.3 05/30/2009 0528   MCHC 33.8 05/30/2009 0528   RDW 14.8 05/30/2009 0528    No results found for: POCLITH, LITHIUM   No results found for: PHENYTOIN, PHENOBARB, VALPROATE, CBMZ   .res Assessment: Plan:   Case staffed with Dr. Clovis Pu. Discussed with patient that based on her report she seems to be experiencing signs and symptoms similar to a mixed episode when PMDD occurs, to include decreased need for sleep, increased energy, increased goal-directed activity, and irritability.  Discussed that she has also described sudden onset of severe depressive s/s with very low energy and motivation that have interfered with her ability to function.  Discussed that her mood signs and symptoms associated with PMDD have not responded to SSRIs, and due to mixed features, may respond better to a mood stabilizer.  Discussed potential benefits, risks, and side effects of Latuda. Discussed potential metabolic side effects associated with atypical antipsychotics, as well as potential risk for movement side effects. Advised pt to contact office if movement side effects occur.  Patient agrees to trial of Latuda and was provided samples.  Will start Latuda 20 mg daily with evening meal for 1 week, then increase to Latuda 40 mg daily with evening meal for mood signs and symptoms.  Will send prescription for Vyvanse 70 mg daily for ADHD since signs and symptoms were previously well controlled with Vyvanse 70 mg daily.  Her insurance has required step therapy, and patient has tried and failed both Adderall XR and Concerta.  Recommend she be able to resume Vyvanse 70 mg daily since changes in ADHD medication is complicating medication adjustments to stabilize mood signs and symptoms when goal is to stabilize these signs and symptoms as soon as possible so  that patient may return to work.  Discussed not resuming any previous medications at this time and taking only Latuda and Vyvanse.  Recommend continuing psychotherapy with Sammuel Cooper, LCSW.  Patient to follow-up with this provider in 3 to 4 weeks or sooner if clinically indicated.  Patient advised to contact office with any questions, adverse effects, or acute worsening in signs and symptoms.    Please see After Visit Summary for patient specific instructions.  Future Appointments  Date Time Provider [place]  10/02/2019  2:00 PM Barnie Del, LCSW [facility] None  10/09/2019  2:00 PM Barnie Del, LCSW [facility] None  10/16/2019  2:00 PM Barnie Del, LCSW [facility] None  10/23/2019  2:00 PM Barnie Del, LCSW [facility] None  10/25/2019  2:00 PM Thayer Headings, PMHNP [facility] None  10/30/2019  2:00 PM Barnie Del, LCSW [facility] None  11/06/2019  8:00 AM Barnie Del, LCSW [facility] None  11/13/2019  1:00 PM Barnie Del, LCSW [facility] None  11/20/2019  8:00 AM Barnie Del, LCSW [facility] None  11/27/2019  8:00 AM Barnie Del, LCSW [facility] None  12/04/2019  1:00 PM Barnie Del, LCSW [facility] None  12/11/2019  1:00 PM Barnie Del, LCSW [facility] None  12/18/2019  1:00 PM Barnie Del, LCSW [facility] None  12/25/2019  1:00 PM Barnie Del, LCSW [facility] None  01/01/2020  1:00 PM Barnie Del, LCSW [facility] None    No orders of the defined types were placed in this encounter.   -------------------------------

## 2019-09-28 MED ORDER — LURASIDONE HCL 40 MG PO TABS: 40.0000 mg | ORAL_TABLET | Freq: Every day | ORAL | 0 refills | Status: DC

## 2019-09-28 MED ORDER — LATUDA 20 MG PO TABS: 20.0000 mg | ORAL_TABLET | Freq: Every day | ORAL | 0 refills | Status: DC

## 2019-10-01 ENCOUNTER — Telehealth: Payer: Self-pay

## 2019-10-01 DIAGNOSIS — F902 Attention-deficit hyperactivity disorder, combined type: Secondary | ICD-10-CM

## 2019-10-01 NOTE — Telephone Encounter (Signed)
Prior authorization submitted and DENIED on VYVANSE 70 MG through [facility]. They require she try and fail generic Metadate CD or Ritalin first.   Will notify Dr. Clovis Pu while Janett Billow is out of office this week.

## 2019-10-02 ENCOUNTER — Ambulatory Visit (INDEPENDENT_AMBULATORY_CARE_PROVIDER_SITE_OTHER): Payer: No Typology Code available for payment source | Admitting: Addiction (Substance Use Disorder)

## 2019-10-02 ENCOUNTER — Encounter: Payer: Self-pay | Admitting: Addiction (Substance Use Disorder)

## 2019-10-02 DIAGNOSIS — F3281 Premenstrual dysphoric disorder: Secondary | ICD-10-CM

## 2019-10-02 DIAGNOSIS — F411 Generalized anxiety disorder: Secondary | ICD-10-CM | POA: Diagnosis not present

## 2019-10-02 DIAGNOSIS — F0634 Mood disorder due to known physiological condition with mixed features: Secondary | ICD-10-CM

## 2019-10-02 NOTE — Progress Notes (Signed)
[facility] Counselor/Therapist Progress Note  Patient ID: RETHER ISAIAH, MRN: [id number],    Date: 10/02/2019  Time Spent: 2:05-3:00pm 19mins  Treatment Type: Individual Therapy  Reported Symptoms:  More regulated, but hormones out of control this past month.   Mental Status Exam:  Appearance:   Neat     Behavior:  Appropriate  Motor:  Normal  Speech/Language:   Pressured  Affect:  Appropriate and Full Range  Mood:  normal  Thought process:  goal directed    Thought content:    Obsessions and Rumination  Sensory/Perceptual disturbances:    WNL  Orientation:  x4  Attention:  Fair  Concentration:  Fair  Memory:  WNL  Fund of knowledge:   Good  Insight:    Good  Judgment:   Good  Impulse Control:  Good   Risk Assessment: Danger to Self:  No Self-injurious Behavior: No Danger to Others: No Duty to Warn:no Physical Aggression / Violence:No  Access to Firearms a concern: No  Gang Involvement:No    Virtual Visit via Telephone Note I Connected with client by a video enabled telemedicine/telehealth application or telephone, with their informed consent, and verified client privacy and that I am speaking with the correct person using two identifiers. I discussed the limitations, risks, security and privacy concerns of performing psychotherapy and management service by telephone/teletherapy and the availability of in person appointments and confirmed their location. I also discussed with the patient that there may be a patient responsible charge related to this service and to confirm with the front desk if their insurance accepts teletherapy. The patient expressed understanding and agreed to proceed. I discussed the treatment planning with the client. The client was provided an opportunity to ask questions and all were answered. The client agreed with the plan and demonstrated an understanding of the instructions. The client was advised to call our office if symptoms  worsen or feel they are in a crisis state and need immediate contact. Client also reminded of a crisis line number and to use 9-1-1 if there's an emergency.  Therapist Location: office; Client Location: home.  Subjective: Client reiviewed with therapist her symptoms over the past month related to her mental health and physical trials including low motivation and fatigue. Client expressed her concerns about being mis-diagnosed with bipolar disorder or just MDD instead of providers also seeing PMDD symptoms during her luteal phase of her menstrual cycle (about a week before her menses). Client processed her pain and now more hopefulness to continue healing her MH and coping with the mood swings and struggles. Therapist used MI to validate client's experiences and stressors this month and over her lifetime with stressful cycles. Thearpist also used psychoeducation to continue teaching client about coping skills for her PMDD regulation.  Client identified more regulation this week and having started a new medication and discontinuing an SSRI & ADHD medication & sleep medications that were causing additional concerns related to oversleeping, being nauseas and being dizzy. Client reported that hormones were out of control this past month and felt embarrassed. Therapist used MI & CBT to help support client in having self compassion.   Interventions: Cognitive Behavioral Therapy, Motivational Interviewing, Solution-Oriented/Positive Psychology and Psycho-education/Bibliotherapy  Diagnosis:   ICD-10-CM   1. PMDD (premenstrual dysphoric disorder)  F32.81   2. Mood disorder due to known physiological condition with mixed features  F06.34   3. Anxiety state  F41.1     Plan of Care: Client to return for  weekly therapy with Sammuel Cooper, therapist, to review again in 6 months. Client to engage in positive self talk and challenging negative internal ruminationsand self talk causing client to be overly anxious  and worriedusing CBT, on daily practice. Client to engage in mindfulness: ie body scans each eveneing to help process and discharge emotional distress & recognize emotions. Client to utilize BSP (brainspotting) with therapist to help client regulate their anxiety in a somatic- felt body sense way: (ieby working to reducemuscle tension,ruminations,increased heart rate, constant worrying and feeling"hyper" ) by decreasing anxietyby 33% in the next 6 months. Client to prioritize sleep 8+ hours each week night AEB going to bed by 10pm each night.   Barnie Del, LCSW, LCAS, CCTP, CCS-I, BSP

## 2019-10-03 DIAGNOSIS — Z0289 Encounter for other administrative examinations: Secondary | ICD-10-CM

## 2019-10-07 MED ORDER — METHYLPHENIDATE HCL ER (CD) 60 MG PO CPCR: 60.0000 mg | ORAL_CAPSULE | ORAL | 0 refills | Status: DC

## 2019-10-07 NOTE — Telephone Encounter (Signed)
Left patient detailed message about her Rx and to call back with any questions or concerns

## 2019-10-09 ENCOUNTER — Encounter: Payer: Self-pay | Admitting: Addiction (Substance Use Disorder)

## 2019-10-09 ENCOUNTER — Ambulatory Visit (INDEPENDENT_AMBULATORY_CARE_PROVIDER_SITE_OTHER): Payer: No Typology Code available for payment source | Admitting: Addiction (Substance Use Disorder)

## 2019-10-09 ENCOUNTER — Other Ambulatory Visit: Payer: Self-pay

## 2019-10-09 DIAGNOSIS — F411 Generalized anxiety disorder: Secondary | ICD-10-CM | POA: Diagnosis not present

## 2019-10-09 DIAGNOSIS — F3281 Premenstrual dysphoric disorder: Secondary | ICD-10-CM

## 2019-10-09 NOTE — Progress Notes (Signed)
      [facility] Counselor/Therapist Progress Note  Patient ID: Sheila James, MRN: [id number],    Date: 10/09/2019  Time Spent: 2:05-3:01pm 54mins  Treatment Type: Individual Therapy  Reported Symptoms:  Less agitated but more aware of mood instability.    Mental Status Exam:  Appearance:   Well Groomed     Behavior:  Appropriate  Motor:  Normal  Speech/Language:   Clear and Coherent  Affect:  Appropriate and Full Range  Mood:  normal  Thought process:  circumstantial and flight of ideas    Thought content:    Obsessions and Rumination  Sensory/Perceptual disturbances:    WNL  Orientation:  x4  Attention:  Good  Concentration:  Fair  Memory:  WNL  Fund of knowledge:   Good  Insight:    Good  Judgment:   Good  Impulse Control:  Good   Risk Assessment: Danger to Self:  No Self-injurious Behavior: No Danger to Others: No Duty to Warn:no Physical Aggression / Violence:No  Access to Firearms a concern: No  Gang Involvement:No     Subjective: Client reported less agitation overall, but still struggling with having a "short fuse" with her sons. Client reported more awareness of mood instability all the time, but esp for 2 weeks in the Luteal phase of her PMS cycle. Client expressed her dysregulation also possibly due to not being on her Vyvance. Therapist used MI and SFT with client to support client and inquire using open ended questions about her stability and functioning. Therapist also helped discuss solutions to her dysregulation with client using SFT. Client processed specific stressors and struggles coordinating/juggling all her responsibilities, esp related to making her sons keep up with school virtually. Client discussed her solution to have her sons retake their classes this summer that they didn't learn this year during [employer]. Client made progress thinking through her solutions and emotionally regulating herself as she worked through stressors.    Interventions: Cognitive Behavioral Therapy, Motivational Interviewing and Solution-Oriented/Positive Psychology  Diagnosis:   ICD-10-CM   1. PMDD (premenstrual dysphoric disorder)  F32.81     Plan of Care: Client to return for weekly therapy with Sammuel Cooper, therapist, to review again in 6 months. Client to engage in positive self talk and challenging negative internal ruminationsand self talk causing client to be overly anxious and worriedusing CBT, on daily practice. Client to engage in mindfulness: ie body scans each eveneing to help process and discharge emotional distress & recognize emotions. Client to utilize BSP (brainspotting) with therapist to help client regulate their anxiety in a somatic- felt body sense way: (ieby working to reducemuscle tension,ruminations,increased heart rate, constant worrying and feeling"hyper" ) by decreasing anxietyby 33% in the next 6 months. Client to prioritize sleep 8+ hours each week night AEB going to bed by 10pm each night.   Barnie Del, LCSW, LCAS, CCTP, CCS-I, BSP

## 2019-10-10 ENCOUNTER — Ambulatory Visit: Payer: No Typology Code available for payment source | Admitting: Psychiatry

## 2019-10-16 ENCOUNTER — Other Ambulatory Visit: Payer: Self-pay

## 2019-10-16 ENCOUNTER — Ambulatory Visit (INDEPENDENT_AMBULATORY_CARE_PROVIDER_SITE_OTHER): Payer: No Typology Code available for payment source | Admitting: Psychiatry

## 2019-10-16 ENCOUNTER — Ambulatory Visit (INDEPENDENT_AMBULATORY_CARE_PROVIDER_SITE_OTHER): Payer: No Typology Code available for payment source | Admitting: Addiction (Substance Use Disorder)

## 2019-10-16 DIAGNOSIS — F902 Attention-deficit hyperactivity disorder, combined type: Secondary | ICD-10-CM | POA: Diagnosis not present

## 2019-10-16 DIAGNOSIS — F0634 Mood disorder due to known physiological condition with mixed features: Secondary | ICD-10-CM

## 2019-10-16 DIAGNOSIS — F3281 Premenstrual dysphoric disorder: Secondary | ICD-10-CM | POA: Diagnosis not present

## 2019-10-16 DIAGNOSIS — G4700 Insomnia, unspecified: Secondary | ICD-10-CM

## 2019-10-16 MED ORDER — LURASIDONE HCL 60 MG PO TABS: 60.0000 mg | ORAL_TABLET | Freq: Every day | ORAL | 0 refills | Status: DC

## 2019-10-16 MED ORDER — LISDEXAMFETAMINE DIMESYLATE 70 MG PO CAPS: 70.0000 mg | ORAL_CAPSULE | Freq: Every day | ORAL | 0 refills | Status: DC

## 2019-10-16 MED ORDER — TRAZODONE HCL 100 MG PO TABS: ORAL_TABLET | ORAL | 0 refills | Status: DC

## 2019-10-16 NOTE — Progress Notes (Signed)
      [facility] Counselor/Therapist Progress Note  Patient ID: Sheila James, MRN: [id number],    Date: 10/16/2019  Time Spent: 2:04-3:00 56 mins  Treatment Type: Individual Therapy  Reported Symptoms:  Less agitation and mood swings, but physical pain.  Mental Status Exam:  Appearance:   Neat     Behavior:  Appropriate and Sharing  Motor:  Normal  Speech/Language:   Clear and Coherent  Affect:  Appropriate and Full Range  Mood:  normal  Thought process:  concrete and goal directed    Thought content:    Obsessions and Rumination  Sensory/Perceptual disturbances:    WNL  Orientation:  x4  Attention:  Good  Concentration:  Fair  Memory:  WNL  Fund of knowledge:   Good  Insight:    Good  Judgment:   Good  Impulse Control:  Good   Risk Assessment: Danger to Self:  No Self-injurious Behavior: No Danger to Others: No Duty to Warn:no Physical Aggression / Violence:No  Access to Firearms a concern: No  Gang Involvement:No   Subjective: Client reported improvement, less mood swings, but physical pain in her back. Client reported taking his medication as prescribed and seeing Dr Eulas Post for refills and medication management. Therapist used MI and CBT to validate clients progress with her [place] condition and the struggling she has been through. Therapist reviewed with client her progress and provided affirmation for the client in session. Therapist used CBT with the client to further assess with client her awareness of her thoughts, feelings & behaviors that create her life stressor dynamic. Client made progress in session discussing how to be proactive and engage in self-care, learning about and treating the condition. Therapist further explored with client a solution and plan for keeping her healthy using SFT.   Interventions: Cognitive Behavioral Therapy, Motivational Interviewing and Solution-Oriented/Positive Psychology  Diagnosis:   ICD-10-CM   1. PMDD (premenstrual  dysphoric disorder)  F32.81   2. Mood disorder due to known physiological condition with mixed features  F06.34     Plan of Care: Client to return for weekly therapy with Sammuel Cooper, therapist, to review again in 6 months. Client to engage in positive self talk and challenging negative internal ruminationsand self talk causing client to be overly anxious and worriedusing CBT, on daily practice. Client to engage in mindfulness: ie body scans each eveneing to help process and discharge emotional distress & recognize emotions. Client to utilize BSP (brainspotting) with therapist to help client regulate their anxiety in a somatic- felt body sense way: (ieby working to reducemuscle tension,ruminations,increased heart rate, constant worrying and feeling"hyper" ) by decreasing anxietyby 33% in the next 6 months. Client to prioritize sleep 8+ hours each week night AEB going to bed by 10pm each night.   Barnie Del, LCSW, LCAS, CCTP, CCS-I, BSP

## 2019-10-16 NOTE — Progress Notes (Signed)
Sheila James [id number] 29-Apr-1976 44 y.o.  Virtual Visit via Video Note  I connected with pt @ on 10/16/19 at  9:00 AM EDT by a video enabled telemedicine application and verified that I am speaking with the correct person using two identifiers.   I discussed the limitations of evaluation and management by telemedicine and the availability of in person appointments. The patient expressed understanding and agreed to proceed.  I discussed the assessment and treatment plan with the patient. The patient was provided an opportunity to ask questions and all were answered. The patient agreed with the plan and demonstrated an understanding of the instructions.   The patient was advised to call back or seek an in-person evaluation if the symptoms worsen or if the condition fails to improve as anticipated.  I provided 30 minutes of non-face-to-face time during this encounter.  The patient was located at home.  The provider was located at [place].   Thayer Headings, PMHNP   Subjective:   Patient ID:  Sheila James is a 44 y.o. (DOB 09-30-1975) female.  Chief Complaint:  Chief Complaint  Patient presents with  . Other    Mood lability  . Anxiety  . ADD  . Sleeping Problem    HPI Sheila James presents for follow-up of mood and anxiety. She reports that her mood has been "better." She reports that her mood has been less irritable. Denies any recent depression. Denies current anxiety. Reports that she had 1-2 days of limited sleep. Reports excessive spending in March. Denies excessive energy or increased goal-directed activity.   She reports that her sleep has improved in the last week. Has been taking Trazodone for sleep. She reports that she took Trazodone 100 mg last night and slept 8 hours a night. Waking up feeling rested. Has been sleeping an average of 5 hours in the last few weeks. She reports that she is able to make lists and be productive. She  reports that her motivation and energy have been good. She reports that she has been able to keep her home clean. She reports that binge eating has improved. Denies SI.   She reports that she was able to get Vyvanse filled and paid cash for a 2-week supply.    She reports that her children have been making some improvements.   She reports that she had significant swelling in her lower extremities and foot pain, back and joint pain, and bloating, SOB, and strained muscles the week before her last period. She had fatigue, binge eating, and some irritability with her last menstrual cycle. LMP- 10/09/19.   Past Psychiatric Medication Trials: abilify- helpful for depression.Wt. Gain Wellbutrin- allergic reaction.  Sertraline Prozac Buspar Lamictal- Some improvement in depressive s/s outside of pre-menstrual time Latuda Adderall XR Vyvanse Concerta- Ineffective (short duration and minimal improvement) Melatonin- Ineffective Trazodone   Review of Systems:  Review of Systems  Musculoskeletal: Positive for back pain and neck stiffness. Negative for gait problem.  Neurological: Negative for tremors.  Psychiatric/Behavioral:       Please refer to HPI   Reports that she may have injured her back due to over-exertion Medications: I have reviewed the patient's current medications.  Current Outpatient Medications  Medication Sig Dispense Refill  . OVER THE COUNTER MEDICATION Cyclene    . ferrous sulfate 325 (65 FE) MG tablet Take 325 mg by mouth daily with breakfast.    . [START ON 10/22/2019] lisdexamfetamine (VYVANSE) 70 MG capsule Take 1 capsule (70  mg total) by mouth daily. 15 capsule 0  . lurasidone (LATUDA) 40 MG TABS tablet Take 1 tablet (40 mg total) by mouth daily with supper. 21 tablet 0  . Lurasidone HCl 60 MG TABS Take 1 tablet (60 mg total) by mouth daily with supper. 30 tablet 0  . methylphenidate (METADATE CD) 60 MG CR capsule Take 1 capsule (60 mg total) by mouth every morning.  30 capsule 0  . Multiple Vitamin (MULTIVITAMIN) tablet Take 1 tablet by mouth daily.    . nabumetone (RELAFEN) 750 MG tablet Take 750 mg by mouth daily.    . naproxen sodium (ALEVE) 220 MG tablet Take 220 mg by mouth.    . naratriptan (AMERGE) 2.5 MG tablet Take 2.5 mg by mouth once as needed for migraine. Once a day as needed    . NON FORMULARY "Relief"- Magnesium,Zinc, Rhodola, inositol, berberine, gardenia, banaba extract, et    . traZODone (DESYREL) 100 MG tablet Take 1/2-1 tablet po QHS prn insomnia 30 tablet 0  . vitamin E 100 UNIT capsule Take 100 Units by mouth daily.     No current facility-administered medications for this visit.    Medication Side Effects: Other: occ tingling  Denies SI.   Allergies:  Allergies  Allergen Reactions  . Wellbutrin Xl [Bupropion]     Past Medical History:  Diagnosis Date  . Asthma   . Bursitis   . H/O: depression   . Migraines   . Pica     Family History  Problem Relation Age of Onset  . Diabetes Mother   . Anxiety disorder Mother   . Diabetes Father   . Hypertension Father   . Asthma Brother   . ADD / ADHD Brother   . Diabetes Brother   . Anxiety disorder Maternal Aunt   . Anxiety disorder Maternal Aunt   . ADD / ADHD Son     Social History   Socioeconomic History  . Marital status: Divorced    Spouse name: Not on file  . Number of children: 2  . Years of education: Not on file  . Highest education level: Not on file  Occupational History  . Occupation: customer service  Tobacco Use  . Smoking status: Never Smoker  . Smokeless tobacco: Never Used  Substance and Sexual Activity  . Alcohol use: No  . Drug use: Never  . Sexual activity: Not Currently    Partners: Male  Other Topics Concern  . Not on file  Social History Narrative  . Not on file   Social Determinants of Health   Financial Resource Strain:   . Difficulty of Paying Living Expenses:   Food Insecurity:   . Worried About Charity fundraiser in the  Last Year:   . Arboriculturist in the Last Year:   Transportation Needs:   . Film/video editor (Medical):   Marland Kitchen Lack of Transportation (Non-Medical):   Physical Activity:   . Days of Exercise per Week:   . Minutes of Exercise per Session:   Stress:   . Feeling of Stress :   Social Connections:   . Frequency of Communication with Friends and Family:   . Frequency of Social Gatherings with Friends and Family:   . Attends Religious Services:   . Active Member of Clubs or Organizations:   . Attends Archivist Meetings:   Marland Kitchen Marital Status:   Intimate Partner Violence:   . Fear of Current or Ex-Partner:   .  Emotionally Abused:   Marland Kitchen Physically Abused:   . Sexually Abused:     Past Medical History, Surgical history, Social history, and Family history were reviewed and updated as appropriate.   Please see review of systems for further details on the patient's review from today.   Objective:   Physical Exam:  There were no vitals taken for this visit.  Physical Exam Neurological:     Mental Status: She is alert and oriented to person, place, and time.     Cranial Nerves: No dysarthria.  Psychiatric:        Attention and Perception: Perception normal. She is inattentive.        Speech: Speech normal.        Behavior: Behavior is cooperative.        Thought Content: Thought content normal. Thought content is not paranoid or delusional. Thought content does not include homicidal or suicidal ideation. Thought content does not include homicidal or suicidal plan.        Cognition and Memory: Cognition and memory normal.        Judgment: Judgment normal.     Comments: Insight intact mood presents as slightly elevated Patient lying in bed and is visibly in pain at times, as evidenced by grimacing and yelling out     Lab Review:  No results found for: NA, K, CL, CO2, GLUCOSE, BUN, CREATININE, CALCIUM, PROT, ALBUMIN, AST, ALT, ALKPHOS, BILITOT, GFRNONAA, GFRAA      Component Value Date/Time   WBC 12.8 (H) 05/30/2009 0528   RBC 2.82 (L) 05/30/2009 0528   HGB 8.2 DELTA CHECK NOTED (L) 05/30/2009 0528   HCT 24.3 (L) 05/30/2009 0528   PLT 188 05/30/2009 0528   MCV 86.3 05/30/2009 0528   MCHC 33.8 05/30/2009 0528   RDW 14.8 05/30/2009 0528    No results found for: POCLITH, LITHIUM   No results found for: PHENYTOIN, PHENOBARB, VALPROATE, CBMZ   .res Assessment: Plan:   Will increase Latuda to 60 mg po qd for mood s/s since patient describes having a partial response to Latuda 40 mg daily, however she is continuing to have some mood lability and mild hypomanic signs and symptoms, to include decreased sleep and possible excessive goal-directed activity. Will continue Vyvanse since patient reports a significant improvement since she has restarted Vyvanse and is paying cash price for Vyvanse until patient assistance becomes effective. Continue trazodone for insomnia and discussed that ensuring adequate sleep would be helpful with controlling manic signs and symptoms. Recommend continuing psychotherapy with Sammuel Cooper, LCSW. Recommend extending leave of absence from work through 11/29/19. Patient to follow-up with this provider in 4 weeks or sooner if clinically indicated. Patient advised to contact office with any questions, adverse effects, or acute worsening in signs and symptoms.  Allysin was seen today for other, anxiety, add and sleeping problem.  Diagnoses and all orders for this visit:  Attention deficit hyperactivity disorder (ADHD), combined type -     lisdexamfetamine (VYVANSE) 70 MG capsule; Take 1 capsule (70 mg total) by mouth daily.  PMDD (premenstrual dysphoric disorder) -     Lurasidone HCl 60 MG TABS; Take 1 tablet (60 mg total) by mouth daily with supper.  Mood disorder due to known physiological condition with mixed features -     Lurasidone HCl 60 MG TABS; Take 1 tablet (60 mg total) by mouth daily with supper.  Insomnia,  unspecified type -     traZODone (DESYREL) 100 MG tablet; Take 1/2-1 tablet po  QHS prn insomnia     Please see After Visit Summary for patient specific instructions.  Future Appointments  Date Time Provider [place]  10/23/2019  2:00 PM Barnie Del, LCSW [facility] None  10/30/2019  2:00 PM Barnie Del, LCSW [facility] None  11/06/2019  8:00 AM Barnie Del, LCSW [facility] None  11/13/2019  1:00 PM Barnie Del, LCSW [facility] None  11/20/2019  8:00 AM Barnie Del, LCSW [facility] None  11/27/2019  8:00 AM Barnie Del, LCSW [facility] None  12/04/2019  1:00 PM Barnie Del, LCSW [facility] None  12/11/2019  1:00 PM Barnie Del, LCSW [facility] None  12/18/2019  1:00 PM Barnie Del, LCSW [facility] None  12/25/2019  1:00 PM Barnie Del, LCSW [facility] None  01/01/2020  1:00 PM Barnie Del, LCSW [facility] None    No orders of the defined types were placed in this encounter.     -------------------------------

## 2019-10-18 ENCOUNTER — Encounter: Payer: Self-pay | Admitting: Psychiatry

## 2019-10-21 ENCOUNTER — Telehealth: Payer: Self-pay

## 2019-10-21 NOTE — Telephone Encounter (Signed)
Contacted [facility] to complete a Prior Authorization for LATUDA 60 MG, response faxed back today is a DENIAL. This medication is only covered if she failed or cannot take ALL of the following: Olanzapine Quetiapine Risperidone Ziprasidone Aripiprazole  An appeal can be filed and faxed to  [employer] [facility] Fax# [phone]

## 2019-10-23 ENCOUNTER — Encounter: Payer: Self-pay | Admitting: Addiction (Substance Use Disorder)

## 2019-10-23 ENCOUNTER — Ambulatory Visit (INDEPENDENT_AMBULATORY_CARE_PROVIDER_SITE_OTHER): Payer: No Typology Code available for payment source | Admitting: Addiction (Substance Use Disorder)

## 2019-10-23 ENCOUNTER — Other Ambulatory Visit: Payer: Self-pay

## 2019-10-23 DIAGNOSIS — F0634 Mood disorder due to known physiological condition with mixed features: Secondary | ICD-10-CM | POA: Diagnosis not present

## 2019-10-23 DIAGNOSIS — F3281 Premenstrual dysphoric disorder: Secondary | ICD-10-CM

## 2019-10-23 DIAGNOSIS — F902 Attention-deficit hyperactivity disorder, combined type: Secondary | ICD-10-CM

## 2019-10-23 NOTE — Progress Notes (Signed)
      [facility] Counselor/Therapist Progress Note  Patient ID: Sheila James, MRN: [id number],    Date: 10/23/2019  Time Spent: 2:05-3:00 55 mins  Treatment Type: Individual Therapy  Reported Symptoms:  Less agitation and mood swings, but still having physical pain.  Mental Status Exam:  Appearance:   Neat     Behavior:  Appropriate and Sharing  Motor:  Normal  Speech/Language:   Clear and Coherent  Affect:  Appropriate and Full Range  Mood:  normal  Thought process:  concrete and goal directed    Thought content:    Obsessions and Rumination  Sensory/Perceptual disturbances:    WNL  Orientation:  x4  Attention:  Good  Concentration:  Fair  Memory:  WNL  Fund of knowledge:   Good  Insight:    Good  Judgment:   Good  Impulse Control:  Good   Risk Assessment: Danger to Self:  No Self-injurious Behavior: No Danger to Others: No Duty to Warn:no Physical Aggression / Violence:No  Access to Firearms a concern: No  Gang Involvement:No   Subjective: Client reported improvement of mood but worsening of physical pain symptoms related to nerve pain. Client reported less insomnia and manic signs as she takes her trazadone and less mood swings as she take a homeopathic med called Cycleanse. Client processed remorse for past excessive spending in March and now having to be tighter on her budget. Client discussed ongoing anxiety and panic but reported forgetting to use her CES Alpha Stim device daily to help manage and prevent anxiety. Therapist used SFT to help client identify more SMART goals for managing her mood disorder including: budgeting, restricting binge eating, sleeping more regularly and enough, and taking her medication as prescribed. Therapist used CBT with client to help her identify root triggers/thoughts related to causing unhealthy behaviors. Client also reported benefiting from her Garey but not knowing how she's going to pay for it since her insurance isnt  covering it. Therapist used MI to offer support for client's struggle and affirming client for her mood disorder progress.   Interventions: Cognitive Behavioral Therapy, Motivational Interviewing and Solution-Oriented/Positive Psychology  Diagnosis:   ICD-10-CM   1. PMDD (premenstrual dysphoric disorder)  F32.81   2. Attention deficit hyperactivity disorder (ADHD), combined type  F90.2   3. Mood disorder due to known physiological condition with mixed features  F06.34     Plan of Care: Client to return for weekly therapy with Sammuel Cooper, therapist, to review again in 6 months. Client to engage in positive self talk and challenging negative internal ruminationsand self talk causing client to be overly anxious and worriedusing CBT, on daily practice. Client to engage in mindfulness: ie body scans each eveneing to help process and discharge emotional distress & recognize emotions. Client to utilize BSP (brainspotting) with therapist to help client regulate their anxiety in a somatic- felt body sense way: (ieby working to reducemuscle tension,ruminations,increased heart rate, constant worrying and feeling"hyper" ) by decreasing anxietyby 33% in the next 6 months. Client to prioritize sleep 8+ hours each week night AEB going to bed by 10pm each night.  Barnie Del, LCSW, LCAS, CCTP, CCS-I, BSP

## 2019-10-25 ENCOUNTER — Ambulatory Visit: Payer: No Typology Code available for payment source | Admitting: Psychiatry

## 2019-10-30 ENCOUNTER — Encounter: Payer: Self-pay | Admitting: Addiction (Substance Use Disorder)

## 2019-10-30 ENCOUNTER — Ambulatory Visit (INDEPENDENT_AMBULATORY_CARE_PROVIDER_SITE_OTHER): Payer: No Typology Code available for payment source | Admitting: Addiction (Substance Use Disorder)

## 2019-10-30 ENCOUNTER — Other Ambulatory Visit: Payer: Self-pay

## 2019-10-30 DIAGNOSIS — F902 Attention-deficit hyperactivity disorder, combined type: Secondary | ICD-10-CM

## 2019-10-30 DIAGNOSIS — F0634 Mood disorder due to known physiological condition with mixed features: Secondary | ICD-10-CM

## 2019-10-30 DIAGNOSIS — F3281 Premenstrual dysphoric disorder: Secondary | ICD-10-CM

## 2019-10-30 NOTE — Progress Notes (Signed)
      [facility] Counselor/Therapist Progress Note  Patient ID: Sheila James, MRN: [id number],    Date: 10/30/2019  Time Spent: 2:05-3:00 46mins  Treatment Type: Individual Therapy  Reported Symptoms:  Groggy, fatigued, frustrated.  Mental Status Exam:  Appearance:   Casual and Neat     Behavior:  Appropriate and Sharing  Motor:  Normal  Speech/Language:   Clear and Coherent and Pressured  Affect:  Appropriate and Full Range  Mood:  anxious and irritable & fatigued  Thought process:  circumstantial and flight of ideas    Thought content:    Obsessions and Rumination  Sensory/Perceptual disturbances:    WNL  Orientation:  x4  Attention:  Good  Concentration:  Fair  Memory:  WNL  Fund of knowledge:   Good  Insight:    Good  Judgment:   Good  Impulse Control:  Good   Risk Assessment: Danger to Self:  No Self-injurious Behavior: No Danger to Others: No Duty to Warn:no Physical Aggression / Violence:No  Access to Firearms a concern: No  Gang Involvement:No   Subjective: Client reported issues with her son and vented about the frustration of parenting. Client expressed her emotional fatigue and compared that to the physical fatigue: ie grogginess, fatigue that she woke up with this morning. Client reported trying to comb through things to figure out the trigger for that but was unable to identify it. Therapist used MI to validate client's fatigue and SFT to help the client have compassion on herself and to help her understand some of her ability to take a break to be mindful and relax to allow herself space to recover emotionally to help her body restore more at night. Client did consider having busy dreams and hot flashes and nerve pain at night, keeping her from having restorative sleep. Therapist and client used mindfulness to help client ground her and practice finding a positive experience and some relief/rest, encouraging her to use it at night before  bedtime.  Interventions: Cognitive Behavioral Therapy, Mindfulness Meditation, Motivational Interviewing and Solution-Oriented/Positive Psychology  Diagnosis:   ICD-10-CM   1. Mood disorder due to known physiological condition with mixed features  F06.34   2. PMDD (premenstrual dysphoric disorder)  F32.81   3. Attention deficit hyperactivity disorder (ADHD), combined type  F90.2     Plan of Care: Client to return for weekly therapy with Sammuel Cooper, therapist, to review again in 6 months. Client to engage in positive self talk and challenging negative internal ruminationsand self talk causing client to be overly anxious and worriedusing CBT, on daily practice. Client to engage in mindfulness: ie body scans each eveneing to help process and discharge emotional distress & recognize emotions. Client to utilize BSP (brainspotting) with therapist to help client regulate their anxiety in a somatic- felt body sense way: (ieby working to reducemuscle tension,ruminations,increased heart rate, constant worrying and feeling"hyper" ) by decreasing anxietyby 33% in the next 6 months. Client to prioritize sleep 8+ hours each week night AEB going to bed by 10pm each night.  Barnie Del, LCSW, LCAS, CCTP, CCS-I, BSP

## 2019-11-06 ENCOUNTER — Telehealth: Payer: Self-pay | Admitting: Psychiatry

## 2019-11-06 ENCOUNTER — Ambulatory Visit: Payer: No Typology Code available for payment source | Admitting: Addiction (Substance Use Disorder)

## 2019-11-06 NOTE — Telephone Encounter (Signed)
Pt would like to know if you have any more samples of Latuda?

## 2019-11-13 ENCOUNTER — Other Ambulatory Visit: Payer: Self-pay

## 2019-11-13 ENCOUNTER — Encounter: Payer: Self-pay | Admitting: Psychiatry

## 2019-11-13 ENCOUNTER — Encounter: Payer: Self-pay | Admitting: Addiction (Substance Use Disorder)

## 2019-11-13 ENCOUNTER — Ambulatory Visit (INDEPENDENT_AMBULATORY_CARE_PROVIDER_SITE_OTHER): Payer: No Typology Code available for payment source | Admitting: Psychiatry

## 2019-11-13 ENCOUNTER — Ambulatory Visit (INDEPENDENT_AMBULATORY_CARE_PROVIDER_SITE_OTHER): Payer: No Typology Code available for payment source | Admitting: Addiction (Substance Use Disorder)

## 2019-11-13 VITALS — BP 121/81 | HR 90

## 2019-11-13 DIAGNOSIS — F0634 Mood disorder due to known physiological condition with mixed features: Secondary | ICD-10-CM | POA: Diagnosis not present

## 2019-11-13 DIAGNOSIS — F3281 Premenstrual dysphoric disorder: Secondary | ICD-10-CM

## 2019-11-13 DIAGNOSIS — F902 Attention-deficit hyperactivity disorder, combined type: Secondary | ICD-10-CM

## 2019-11-13 DIAGNOSIS — G4700 Insomnia, unspecified: Secondary | ICD-10-CM

## 2019-11-13 MED ORDER — RISPERIDONE 1 MG PO TABS: 1.0000 mg | ORAL_TABLET | Freq: Every day | ORAL | 0 refills | Status: DC

## 2019-11-13 MED ORDER — LISDEXAMFETAMINE DIMESYLATE 70 MG PO CAPS: 70.0000 mg | ORAL_CAPSULE | Freq: Every day | ORAL | 0 refills | Status: DC

## 2019-11-13 NOTE — Progress Notes (Signed)
Sheila James [id number] 12/11/1975 44 y.o.  Subjective:   Patient ID:  Sheila James is a 44 y.o. (DOB [birth year]) female.  Chief Complaint:  Chief Complaint  Patient presents with  . Other    Mood lability, irritability  . Anxiety  . ADD    HPI Sheila James presents to the office today for follow-up of mood, anxiety, ADD, and PMDD. She reports, "I'm still moody." She reports that LMP was 11/05/19. She reports that she had severe irritability last week and was "snapping and fussing" frequently. She reports that she did not notice sad mood, however energy and motivation was very low. She reports that she unintentionally fell asleep for an hour in her car while her children were eating their snacks. She reports that she had "really bad brain fog last week despite taking the Vyvanse" and was extremely sleepy and was not taking Trazodone since it sometimes lasts too long. She is now using Melatonin more since sleep cycle is starting to regulate. She reports that she has been trying to write a letter for weeks and had time to do it last week but was not able to sustain focus and was procrastinating. Reports that she has had extreme fatigue last week. She reports that she ran out of supplement to help with concentration and noticed worsening concentration and focus during that time. She reports that before her period she will crave sweets and binge eat and notices her energy is lower after eating sweets. She reports that around the start of her period, she went and bought a $38 ice cream cake and ate about half of the cake. She reports that Vyvanse has helped some with binge eating until the evening when Vyvanse wears off. She reports minimal sleep right before onset of menses.   She reports that before her luteal phase that generally her mood is "happy" and "my kids think I am too happy" and her children have asked her to be calmer in the morning. She reports that she has  increased goal-directed activity. She reports that during these periods her sleep amounts are around 6-9 hours. She reports that energy is higher during those periods.   She reports that she decreased Latuda to 40 mg during most recent luteal phase due to limited supply of samples. Has not yet taken Latuda 60 mg po qd during Luteal phase. She reports Anette Guarneri has been helpful for anxiety and depression.   Reports having a "bout of depression" last week when her period started and she had to decrease Latuda to 40 mg po qd. She reports that she was having difficulty coming into the office to get additional samples and was crying.   Ran out of Vyvnase last week and filled Metadate until she could be seen today.   Past Psychiatric Medication Trials: abilify- helpful for depression.Wt. Gain.  Wellbutrin- allergic reaction.  Sertraline Prozac Buspar Lamictal- Some improvement in depressive s/s outside of pre-menstrual time Latuda Adderall XR Vyvanse Concerta- Ineffective (short duration and minimal improvement) Melatonin- Ineffective Trazodone AIMS     Office Visit from 11/13/2019 in [facility]  AIMS Total Score  0       Review of Systems:  Review of Systems  Musculoskeletal: Positive for back pain. Negative for gait problem.  Neurological: Negative for tremors.       Has been having less migraines and did not have migraine during this menstrual cycle until today with abrupt change in weather.   Psychiatric/Behavioral:  Please refer to HPI    Medications: I have reviewed the patient's current medications.  Current Outpatient Medications  Medication Sig Dispense Refill  . ferrous sulfate 325 (65 FE) MG tablet Take 325 mg by mouth daily with breakfast.    . methylphenidate (METADATE CD) 60 MG CR capsule Take 1 capsule (60 mg total) by mouth every morning. 30 capsule 0  . Multiple Vitamin (MULTIVITAMIN) tablet Take 1 tablet by mouth daily.    . nabumetone  (RELAFEN) 750 MG tablet Take 750 mg by mouth daily as needed.     . naproxen sodium (ALEVE) 220 MG tablet Take 220 mg by mouth daily as needed.     . NON FORMULARY "Relief"- Magnesium,Zinc, Rhodola, inositol, berberine, gardenia, banaba extract, et    . traZODone (DESYREL) 100 MG tablet Take 1/2-1 tablet po QHS prn insomnia 30 tablet 0  . vitamin E 100 UNIT capsule Take 100 Units by mouth daily.    Marland Kitchen lisdexamfetamine (VYVANSE) 70 MG capsule Take 1 capsule (70 mg total) by mouth daily. 15 capsule 0  . naratriptan (AMERGE) 2.5 MG tablet Take 2.5 mg by mouth once as needed for migraine. Once a day as needed    . OVER THE COUNTER MEDICATION Cyclene    . risperiDONE (RISPERDAL) 1 MG tablet Take 1 tablet (1 mg total) by mouth at bedtime. 30 tablet 0   No current facility-administered medications for this visit.    Medication Side Effects: None  Allergies:  Allergies  Allergen Reactions  . Wellbutrin Xl [Bupropion]     Past Medical History:  Diagnosis Date  . Asthma   . Bursitis   . H/O: depression   . Migraines   . Pica     Family History  Problem Relation Age of Onset  . Diabetes Mother   . Anxiety disorder Mother   . Diabetes Father   . Hypertension Father   . Asthma Brother   . ADD / ADHD Brother   . Diabetes Brother   . Anxiety disorder Maternal Aunt   . Anxiety disorder Maternal Aunt   . ADD / ADHD Son     Social History   Socioeconomic History  . Marital status: Divorced    Spouse name: Not on file  . Number of children: 2  . Years of education: Not on file  . Highest education level: Not on file  Occupational History  . Occupation: customer service  Tobacco Use  . Smoking status: Never Smoker  . Smokeless tobacco: Never Used  Substance and Sexual Activity  . Alcohol use: No  . Drug use: Never  . Sexual activity: Not Currently    Partners: Male  Other Topics Concern  . Not on file  Social History Narrative  . Not on file   Social Determinants of  Health   Financial Resource Strain:   . Difficulty of Paying Living Expenses:   Food Insecurity:   . Worried About Charity fundraiser in the Last Year:   . Arboriculturist in the Last Year:   Transportation Needs:   . Film/video editor (Medical):   Marland Kitchen Lack of Transportation (Non-Medical):   Physical Activity:   . Days of Exercise per Week:   . Minutes of Exercise per Session:   Stress:   . Feeling of Stress :   Social Connections:   . Frequency of Communication with Friends and Family:   . Frequency of Social Gatherings with Friends and Family:   .  Attends Religious Services:   . Active Member of Clubs or Organizations:   . Attends Archivist Meetings:   Marland Kitchen Marital Status:   Intimate Partner Violence:   . Fear of Current or Ex-Partner:   . Emotionally Abused:   Marland Kitchen Physically Abused:   . Sexually Abused:     Past Medical History, Surgical history, Social history, and Family history were reviewed and updated as appropriate.   Please see review of systems for further details on the patient's review from today.   Objective:   Physical Exam:  BP 121/81   Pulse 90   Physical Exam Constitutional:      General: She is not in acute distress. Musculoskeletal:        General: No deformity.  Neurological:     Mental Status: She is alert and oriented to person, place, and time.     Coordination: Coordination normal.  Psychiatric:        Attention and Perception: Perception normal. She is inattentive. She does not perceive auditory or visual hallucinations.        Mood and Affect: Mood is anxious. Mood is not depressed. Affect is labile. Affect is not blunt, angry or inappropriate.        Behavior: Behavior is hyperactive. Behavior is cooperative.        Thought Content: Thought content normal. Thought content is not paranoid or delusional. Thought content does not include homicidal or suicidal ideation. Thought content does not include homicidal or suicidal plan.         Cognition and Memory: Cognition and memory normal.        Judgment: Judgment normal.     Comments: Insight intact Speech: Increased rate     Lab Review:  No results found for: NA, K, CL, CO2, GLUCOSE, BUN, CREATININE, CALCIUM, PROT, ALBUMIN, AST, ALT, ALKPHOS, BILITOT, GFRNONAA, GFRAA     Component Value Date/Time   WBC 12.8 (H) 05/30/2009 0528   RBC 2.82 (L) 05/30/2009 0528   HGB 8.2 DELTA CHECK NOTED (L) 05/30/2009 0528   HCT 24.3 (L) 05/30/2009 0528   PLT 188 05/30/2009 0528   MCV 86.3 05/30/2009 0528   MCHC 33.8 05/30/2009 0528   RDW 14.8 05/30/2009 0528    No results found for: POCLITH, LITHIUM   No results found for: PHENYTOIN, PHENOBARB, VALPROATE, CBMZ   .res Assessment: Plan:   Recommend extending leave through 12/17/19. Discussed insurance will not cover Latuda until she has failed 5 atypical antipsychotics. Discussed alternatives to [place]. Discussed potential benefits, risks, and side effects of Risperdal. Discussed potential metabolic side effects associated with atypical antipsychotics, as well as potential risk for movement side effects. Advised pt to contact office if movement side effects occur. Pt agrees to trial of Risperdal. Will start Risperdal 1 mg po QHS for mood s/s. Will send script for Vyvanse 70 mg po q am for pt to take when she is able to afford cash price or when Metadate script is completed. Continue Trazodone prn insomnia.  Recommend continuing psychotherapy with Sammuel Cooper, LCSW.  Pt to f/u with this provider in 4 weeks or sooner if clinically indicated.  Patient advised to contact office with any questions, adverse effects, or acute worsening in signs and symptoms.  Sheila James was seen today for other, anxiety and add.  Diagnoses and all orders for this visit:  Mood disorder due to known physiological condition with mixed features -     risperiDONE (RISPERDAL) 1 MG tablet; Take 1 tablet (1  mg total) by mouth at bedtime.  PMDD (premenstrual  dysphoric disorder) -     risperiDONE (RISPERDAL) 1 MG tablet; Take 1 tablet (1 mg total) by mouth at bedtime.  Insomnia, unspecified type  Attention deficit hyperactivity disorder (ADHD), combined type -     lisdexamfetamine (VYVANSE) 70 MG capsule; Take 1 capsule (70 mg total) by mouth daily.     Please see After Visit Summary for patient specific instructions.  Future Appointments  Date Time Provider [place]  11/13/2019  1:00 PM Barnie Del, LCSW [facility] None  11/20/2019  8:00 AM Barnie Del, LCSW [facility] None  11/27/2019  8:00 AM Barnie Del, LCSW [facility] None  12/04/2019  1:00 PM Barnie Del, LCSW [facility] None  12/11/2019  8:30 AM Thayer Headings, PMHNP [facility] None  12/11/2019  1:00 PM Barnie Del, LCSW [facility] None  12/18/2019  1:00 PM Barnie Del, LCSW [facility] None  12/25/2019  1:00 PM Barnie Del, LCSW [facility] None  01/01/2020  1:00 PM Barnie Del, LCSW [facility] None    No orders of the defined types were placed in this encounter.   -------------------------------

## 2019-11-13 NOTE — Progress Notes (Signed)
   11/13/19 1004  Facial and Oral Movements  Muscles of Facial Expression 0  Lips and Perioral Area 0  Jaw 0  Tongue 0  Extremity Movements  Upper (arms, wrists, hands, fingers) 0  Lower (legs, knees, ankles, toes) 0  Trunk Movements  Neck, shoulders, hips 0  Overall Severity  Severity of abnormal movements (highest score from questions above) 0  Incapacitation due to abnormal movements 0  Patient's awareness of abnormal movements (rate only patient's report) 0  AIMS Total Score  AIMS Total Score 0

## 2019-11-13 NOTE — Progress Notes (Signed)
      [facility] Counselor/Therapist Progress Note  Patient ID: Sheila James, MRN: [id number],    Date: 11/13/2019  Time Spent: 1:06-2:00 95mins  Treatment Type: Individual Therapy  Reported Symptoms:  Mood lability, motivated to return to work.   Mental Status Exam:  Appearance:   Casual and Neat     Behavior:  Appropriate and Sharing  Motor:  Normal  Speech/Language:   Clear and Coherent and Pressured  Affect:  Appropriate and Full Range  Mood:  anxious, depressed and labile & fatigued  Thought process:  circumstantial and flight of ideas    Thought content:    Obsessions and Rumination  Sensory/Perceptual disturbances:    WNL  Orientation:  x4  Attention:  Good  Concentration:  Fair  Memory:  WNL  Fund of knowledge:   Good  Insight:    Good  Judgment:   Good  Impulse Control:  Good   Risk Assessment: Danger to Self:  No Self-injurious Behavior: No Danger to Others: No Duty to Warn:no Physical Aggression / Violence:No  Access to Firearms a concern: No  Gang Involvement:No   Subjective: Client reported increased need for stability and increasing her meds that will help with her PMDD. Client reports crying spells & "bouts of depression" last week when her period started. Client struggling with irriatble moods with kids and therapist used roleplaying and MI with client to help support her in these challenges and helping her having places to discuss parenting issues with her kids and frustrations. Therapist used SFT with client to discuss how she can work towards returning to work by stabilizing her [place] even further. Client discussed her needs for meditating for calming her irritability and therapist used mindfulness in session to help client stabilize. Client working on emotional flexibility in her life with hardships.   Interventions: Cognitive Behavioral Therapy, Roleplay, Mindfulness Meditation, Motivational Interviewing and Solution-Oriented/Positive  Psychology  Diagnosis:   ICD-10-CM   1. PMDD (premenstrual dysphoric disorder)  F32.81   2. Mood disorder due to known physiological condition with mixed features  F06.34     Plan of Care: Client to return for weekly therapy with Sheila James, therapist, to review again in 6 months. Client to engage in positive self talk and challenging negative internal ruminationsand self talk causing client to be overly anxious and worriedusing CBT, on daily practice. Client to engage in mindfulness: ie body scans each eveneing to help process and discharge emotional distress & recognize emotions. Client to utilize BSP (brainspotting) with therapist to help client regulate their anxiety in a somatic- felt body sense way: (ieby working to reducemuscle tension,ruminations,increased heart rate, constant worrying and feeling"hyper" ) by decreasing anxietyby 33% in the next 6 months. Client to prioritize sleep 8+ hours each week night AEB going to bed by 10pm each night.  Sheila Del, LCSW, LCAS, CCTP, CCS-I, BSP

## 2019-11-20 ENCOUNTER — Other Ambulatory Visit: Payer: Self-pay

## 2019-11-20 ENCOUNTER — Ambulatory Visit (INDEPENDENT_AMBULATORY_CARE_PROVIDER_SITE_OTHER): Payer: No Typology Code available for payment source | Admitting: Addiction (Substance Use Disorder)

## 2019-11-20 ENCOUNTER — Encounter: Payer: Self-pay | Admitting: Addiction (Substance Use Disorder)

## 2019-11-20 ENCOUNTER — Telehealth: Payer: Self-pay | Admitting: Psychiatry

## 2019-11-20 DIAGNOSIS — G4700 Insomnia, unspecified: Secondary | ICD-10-CM

## 2019-11-20 DIAGNOSIS — F0634 Mood disorder due to known physiological condition with mixed features: Secondary | ICD-10-CM

## 2019-11-20 DIAGNOSIS — F411 Generalized anxiety disorder: Secondary | ICD-10-CM

## 2019-11-20 DIAGNOSIS — F902 Attention-deficit hyperactivity disorder, combined type: Secondary | ICD-10-CM

## 2019-11-20 MED ORDER — LISDEXAMFETAMINE DIMESYLATE 70 MG PO CAPS: 70.0000 mg | ORAL_CAPSULE | Freq: Every day | ORAL | 0 refills | Status: DC

## 2019-11-20 MED ORDER — LAMOTRIGINE 25 MG PO TABS: ORAL_TABLET | ORAL | 0 refills | Status: DC

## 2019-11-20 NOTE — Telephone Encounter (Signed)
She reports that she felt that Risperdal 1 mg was "too strong and could not function all day but was less moody." She reports that she has continued to take Latuda 60 mg po qd and stopped Risperdal due to side effects. She reports that she is considering decreasing Risperdal to 1/2 tablet.   She reports that Methylphenidate has not been effective for ADD s/s or binge eating. She indicates that she would like to take Vyvanse instead and will pay for one week at a time out of pocket while awaiting review for Pt Assistance.    PLAN: -Stop Latuda -Start Risperidone 1 mg 1/2 tab po QHS. -Stop Methylphenidate CD -Re-start Vyvanse -Will re-start lamictal for mood stabilization. Counseled patient regarding potential benefits, risks, and side effects of Lamictal to include potential risk of Stevens-Johnson syndrome. Advised patient to stop taking Lamictal and contact office immediately if rash develops and to seek urgent medical attention if rash is severe and/or spreading quickly. -Follow-up on 12/11/19 as scheduled. -Patient advised to contact office with any questions, adverse effects, or acute worsening in signs and symptoms.

## 2019-11-20 NOTE — Progress Notes (Signed)
      [facility] Counselor/Therapist Progress Note  Patient ID: Sheila James, MRN: [id number],    Date: 11/20/2019  Time Spent: 8:07-9:03 56 mins  Treatment Type: Individual Therapy  Reported Symptoms:  Feeling shift in hormones, tired, binge eating.  Mental Status Exam:  Appearance:   Casual     Behavior:  Appropriate and Sharing  Motor:  Normal  Speech/Language:   Clear and Coherent and Normal Rate  Affect:  Appropriate and Full Range  Mood:  labile & fatigued  Thought process:  circumstantial and flight of ideas    Thought content:    Obsessions and Rumination  Sensory/Perceptual disturbances:    WNL  Orientation:  x4  Attention:  Good  Concentration:  Fair  Memory:  WNL  Fund of knowledge:   Good  Insight:    Good  Judgment:   Good  Impulse Control:  Good   Risk Assessment: Danger to Self:  No Self-injurious Behavior: No Danger to Others: No Duty to Warn:no Physical Aggression / Violence:No  Access to Firearms a concern: No  Gang Involvement:No   Subjective: Client reported feeling a big shift in hormones, feeling so tired, and having a lot of binge eating. Client connected that to her PMDD, caused by her luteal phase. Therapist used MI to encourage client to continue to manage it to the best of her ability and led client in a grounding exercise (mindfulness). Therapist used MI and CBT with client to help client remain hopeful about her ability to endure through the hormone shift, challenging her negative thinking.  Therapist used family systems to discuss with client her son's struggle with his mood and anger, esp due to his experience at a bad school in 2nd grade. Client struggled with guilt that he had to be there due to their situation, but is working hard to parent well trying to undo the damage done to him by his mean peers.  Interventions: Cognitive Behavioral Therapy, Mindfulness Meditation, Motivational Interviewing and Family Systems  Diagnosis:   ICD-10-CM   1. Mood disorder due to known physiological condition with mixed features  F06.34   2. Insomnia, unspecified type  G47.00   3. Generalized anxiety disorder  F41.1    Plan of Care: Client to return for weekly therapy with Sammuel Cooper, therapist, to review again in 6 months. Client to engage in positive self talk and challenging negative internal ruminationsand self talk causing client to be overly anxious and worriedusing CBT, on daily practice. Client to engage in mindfulness: ie body scans each eveneing to help process and discharge emotional distress & recognize emotions. Client to utilize BSP (brainspotting) with therapist to help client regulate their anxiety in a somatic- felt body sense way: (ieby working to reducemuscle tension,ruminations,increased heart rate, constant worrying and feeling"hyper" ) by decreasing anxietyby 33% in the next 6 months. Client to prioritize sleep 8+ hours each week night AEB going to bed by 10pm each night.  Barnie Del, LCSW, LCAS, CCTP, CCS-I, BSP

## 2019-11-20 NOTE — Telephone Encounter (Signed)
Attempted to call pt to f/u about note that she had left when she was in the office today for a visit with her therapist. Left message for pt requesting that she contact office to clarify information in note and that provider may also try to call her again later today.

## 2019-11-27 ENCOUNTER — Ambulatory Visit: Payer: No Typology Code available for payment source | Admitting: Addiction (Substance Use Disorder)

## 2019-12-04 ENCOUNTER — Other Ambulatory Visit: Payer: Self-pay

## 2019-12-04 ENCOUNTER — Encounter: Payer: Self-pay | Admitting: Addiction (Substance Use Disorder)

## 2019-12-04 ENCOUNTER — Ambulatory Visit (INDEPENDENT_AMBULATORY_CARE_PROVIDER_SITE_OTHER): Payer: No Typology Code available for payment source | Admitting: Addiction (Substance Use Disorder)

## 2019-12-04 DIAGNOSIS — F902 Attention-deficit hyperactivity disorder, combined type: Secondary | ICD-10-CM | POA: Diagnosis not present

## 2019-12-04 DIAGNOSIS — F3281 Premenstrual dysphoric disorder: Secondary | ICD-10-CM | POA: Diagnosis not present

## 2019-12-04 DIAGNOSIS — F0634 Mood disorder due to known physiological condition with mixed features: Secondary | ICD-10-CM | POA: Diagnosis not present

## 2019-12-04 NOTE — Progress Notes (Signed)
      [facility] Counselor/Therapist Progress Note  Patient ID: Sheila James, MRN: [id number],    Date: 12/04/2019  Time Spent: 1:04-1:58 54 mins  Treatment Type: Individual Therapy  Reported Symptoms: mood lability, fears of returning to work, stress about high blood sugar.   Mental Status Exam:  Appearance:   Casual and Well Groomed     Behavior:  Appropriate and Sharing  Motor:  Normal  Speech/Language:   Clear and Coherent and Pressured  Affect:  Appropriate and Full Range  Mood:  anxious and labile   Thought process:  circumstantial and flight of ideas    Thought content:    Obsessions and Rumination  Sensory/Perceptual disturbances:    WNL  Orientation:  x4  Attention:  Good  Concentration:  Fair  Memory:  WNL  Fund of knowledge:   Good  Insight:    Good  Judgment:   Good  Impulse Control:  Good   Risk Assessment: Danger to Self:  No Self-injurious Behavior: No Danger to Others: No Duty to Warn:no Physical Aggression / Violence:No  Access to Firearms a concern: No  Gang Involvement:No   Subjective: Client reported increased issues with PMDDE (the emotional issues related to her luteal phase of her menstrual cycles & PMDD). Client described how she had been doing better with her moods until she went off her meds following getting results from doctors who said her blood sugar and cholesterol were high. Client concerned those were high as a result of side effects of her MH meds, but therapist used MI & CBT with client to support, validate her concerns, while also challenging her thoughts and help her consider the benefit that those [place] meds were still giving her. Therapist used SFT with client to encourage the use of a nutritionist to help her manage her eating to improve health outcomes instead of giving up on taking her [place] meds. Therapist assessed for stability and safety and client denied SI/HI/AVH but reported increased anxiety and instability and anhedonia.    Interventions: Cognitive Behavioral Therapy, Motivational Interviewing and Solution-Oriented/Positive Psychology  Diagnosis:   ICD-10-CM   1. PMDD (premenstrual dysphoric disorder)  F32.81   2. Mood disorder due to known physiological condition with mixed features  F06.34   3. Attention deficit hyperactivity disorder (ADHD), combined type  F90.2     Plan of Care: Client to return for weekly therapy with Sammuel Cooper, therapist, to review again in 6 months. Client to engage in positive self talk and challenging negative internal ruminationsand self talk causing client to be overly anxious and worriedusing CBT, on daily practice. Client to engage in mindfulness: ie body scans each eveneing to help process and discharge emotional distress & recognize emotions. Client to utilize BSP (brainspotting) with therapist to help client regulate their anxiety in a somatic- felt body sense way: (ieby working to reducemuscle tension,ruminations,increased heart rate, constant worrying and feeling"hyper" ) by decreasing anxietyby 33% in the next 6 months. Client to prioritize sleep 8+ hours each week night AEB going to bed by 10pm each night.  Barnie Del, LCSW, LCAS, CCTP, CCS-I, BSP

## 2019-12-11 ENCOUNTER — Telehealth: Payer: Self-pay | Admitting: Psychiatry

## 2019-12-11 ENCOUNTER — Ambulatory Visit (INDEPENDENT_AMBULATORY_CARE_PROVIDER_SITE_OTHER): Payer: No Typology Code available for payment source | Admitting: Addiction (Substance Use Disorder)

## 2019-12-11 ENCOUNTER — Encounter: Payer: Self-pay | Admitting: Addiction (Substance Use Disorder)

## 2019-12-11 ENCOUNTER — Other Ambulatory Visit: Payer: Self-pay

## 2019-12-11 ENCOUNTER — Ambulatory Visit: Payer: No Typology Code available for payment source | Admitting: Psychiatry

## 2019-12-11 DIAGNOSIS — F3281 Premenstrual dysphoric disorder: Secondary | ICD-10-CM

## 2019-12-11 DIAGNOSIS — F411 Generalized anxiety disorder: Secondary | ICD-10-CM | POA: Diagnosis not present

## 2019-12-11 NOTE — Telephone Encounter (Signed)
Pt is requesting a refill on her Vyvanse .Fill at the [facility]  [address] in [place]. She is requesting to discontinue or modify the Risperdal due to causing sugar and cholesterol readings to fluctuate..She stated it's not working. Please advise.

## 2019-12-11 NOTE — Progress Notes (Signed)
      [facility] Counselor/Therapist Progress Note  Patient ID: Sheila James, MRN: [id number],    Date: 12/11/2019  Time Spent: 1:15-1:55 40 mins  Treatment Type: Individual Therapy  Reported Symptoms: sad, tearful, discouraged, depressed, so tired, labile.    Mental Status Exam:  Appearance:   Casual and Well Groomed     Behavior:  Appropriate and Sharing  Motor:  Normal  Speech/Language:   Clear and Coherent and Pressured  Affect:  Appropriate and Full Range  Mood:  anxious and labile   Thought process:  circumstantial and flight of ideas    Thought content:    Obsessions and Rumination  Sensory/Perceptual disturbances:    WNL  Orientation:  x4  Attention:  Good  Concentration:  Fair  Memory:  WNL  Fund of knowledge:   Good  Insight:    Good  Judgment:   Good  Impulse Control:  Good   Risk Assessment: Danger to Self:  No Self-injurious Behavior: No Danger to Others: No Duty to Warn:no Physical Aggression / Violence:No  Access to Firearms a concern: No  Gang Involvement:No   Subjective: Client reported  Feeling sad, tearful, discouraged, depressed, and so tired. Client tearful and dysregulated and reports not feeling like herself. Client struggling with avoidance when doing chores because she gets behind and then gets anxious/overwhelmed by her to-do list. Client processed the down-ward spiral of her MH over the last 3 months and her inability to stabilize. Client did report feeling like herself on the [place] and on the new medication she feels so sad and unable to feel okay. Therapist used MI and and Mindfulness with client to support her and remind her of her hope to recovery. Therapist also led client in a grounding exercise to help her get in touch with her most calm part of herself and help her stop ruminating. Client reports feeling like her mind is full of things and her thoughts about tasks on her to-do list are overwhelming her. Therapist checked for  stability and safety, and client denied SI/HI/AVH and denied any mania occurrences, but yet reported increased anxiety and irritability. Therapist used SFT with client to comb through her past responses to medications and discussed coping skills to help her find a solution to help her stabilize her MH more effectively.   Interventions: Cognitive Behavioral Therapy, Motivational Interviewing and Solution-Oriented/Positive Psychology  Diagnosis:   ICD-10-CM   1. PMDD (premenstrual dysphoric disorder)  F32.81   2. Generalized anxiety disorder  F41.1    Plan of Care: Client to return for weekly therapy with Sammuel Cooper, therapist, to review again in 6 months. Client to engage in positive self talk and challenging negative internal ruminationsand self talk causing client to be overly anxious and worriedusing CBT, on daily practice. Client to engage in mindfulness: ie body scans each eveneing to help process and discharge emotional distress & recognize emotions. Client to utilize BSP (brainspotting) with therapist to help client regulate their anxiety in a somatic- felt body sense way: (ieby working to reducemuscle tension,ruminations,increased heart rate, constant worrying and feeling"hyper" ) by decreasing anxietyby 33% in the next 6 months. Client to prioritize sleep 8+ hours each week night AEB going to bed by 10pm each night.  Barnie Del, LCSW, LCAS, CCTP, CCS-I, BSP

## 2019-12-17 ENCOUNTER — Encounter: Payer: Self-pay | Admitting: Psychiatry

## 2019-12-17 ENCOUNTER — Ambulatory Visit (INDEPENDENT_AMBULATORY_CARE_PROVIDER_SITE_OTHER): Payer: No Typology Code available for payment source | Admitting: Psychiatry

## 2019-12-17 ENCOUNTER — Other Ambulatory Visit: Payer: Self-pay

## 2019-12-17 VITALS — BP 124/84 | HR 89

## 2019-12-17 DIAGNOSIS — F0634 Mood disorder due to known physiological condition with mixed features: Secondary | ICD-10-CM

## 2019-12-17 DIAGNOSIS — F902 Attention-deficit hyperactivity disorder, combined type: Secondary | ICD-10-CM

## 2019-12-17 DIAGNOSIS — F3281 Premenstrual dysphoric disorder: Secondary | ICD-10-CM

## 2019-12-17 DIAGNOSIS — F411 Generalized anxiety disorder: Secondary | ICD-10-CM | POA: Diagnosis not present

## 2019-12-17 DIAGNOSIS — G4700 Insomnia, unspecified: Secondary | ICD-10-CM

## 2019-12-17 MED ORDER — LURASIDONE HCL 40 MG PO TABS: 40.0000 mg | ORAL_TABLET | Freq: Every day | ORAL | 0 refills | Status: DC

## 2019-12-17 MED ORDER — LATUDA 60 MG PO TABS: 60.0000 mg | ORAL_TABLET | Freq: Every day | ORAL | 0 refills | Status: DC

## 2019-12-17 MED ORDER — ZIPRASIDONE HCL 20 MG PO CAPS: 20.0000 mg | ORAL_CAPSULE | Freq: Two times a day (BID) | ORAL | 0 refills | Status: DC

## 2019-12-17 MED ORDER — LAMOTRIGINE 100 MG PO TABS: ORAL_TABLET | ORAL | 0 refills | Status: DC

## 2019-12-17 NOTE — Patient Instructions (Signed)
-  Stop Risperidone -Fill Ziprasidone -Start Latuda 40 mg daily with evening meal for one week, then increase to 60 mg daily -Try to get Vyvanse filled

## 2019-12-17 NOTE — Progress Notes (Signed)
Sheila James [id number] 1975-07-15 44 y.o.  Subjective:   Patient ID:  Sheila James is a 44 y.o. (DOB 1976/01/16) female.  Chief Complaint:  Chief Complaint  Patient presents with  . Other    Mood Disturbance  . Anxiety  . Insomnia  . ADD    HPI Sheila James presents to the office today for follow-up of mood disturbance, anxiety, insomnia, and ADD.  She reports that she has had elevated glucose levels since starting Risperdal. She reports that she is noticing sugar cravings and binge eating. She reports that she had some food cravings on Latuda and this was less severe. She suspects that binge eating has a significant impact on glucose levels.  She reports that Risperdal "does not work like [place]." She reports that her sleep has been improved with Risperdal and it has helped some with her mood. Sleep quantity has varied and she is sleeping about 8 hours on average, with sleep quantity ranging from 5-11 hours. Reports altered sleep wake cycle.  She reports irritability has been decreased. Has had a few episodes of irritability. She reports that she has periods of lower mood, low energy, and wanting to socially withdrawal. Mood is typically is most depressed prior to onset of menses. She reports that this past cycle mood and energy did not improve with the onset of menses and also had more crying episodes. She reports that she has ben anxious and this has contributed to crying episodes. She reports that she has been working on meal planning based on what she has researched her body needs during different phases of her menstrual cycle. She reports that her energy has been very low these last few weeks. Motivation has been very low. She reports that she has had periods where concentration has been poor and describes "brain fog." Denies any elevated moods, impulsivity, or risky behavior. Denies SI.   She reports that she reviewed her medical records she recognized that she  felt best with latuda and Vyvanse.   Past Psychiatric Medication Trials: Wellbutrin- allergic reaction.  Sertraline Prozac Buspar Lamictal- Some improvement in depressive s/s outside of pre-menstrual time Latuda Abilify- helpful for depression.Wt. Gain.  Risperdal Adderall XR Vyvanse Concerta- Ineffective (short duration and minimal improvement) Melatonin- Ineffective Trazodone   AIMS     Office Visit from 11/13/2019 in [facility]  AIMS Total Score 0       Review of Systems:  Review of Systems  Constitutional: Positive for fatigue.  Musculoskeletal: Negative for gait problem.  Neurological: Positive for tremors and headaches.  Psychiatric/Behavioral:       Please refer to HPI   Had second covid vaccine 11/26/19.   Medications: I have reviewed the patient's current medications.  Current Outpatient Medications  Medication Sig Dispense Refill  . B Complex Vitamins (B COMPLEX PO) Take by mouth.    . ferrous sulfate 325 (65 FE) MG tablet Take 325 mg by mouth daily with breakfast.    . lamoTRIgine (LAMICTAL) 25 MG tablet Take 1 tablet (25 mg total) by mouth daily for 14 days, THEN 2 tablets (50 mg total) daily for 14 days. 45 tablet 0  . Multiple Vitamin (MULTIVITAMIN) tablet Take 1 tablet by mouth daily.    . naproxen sodium (ALEVE) 220 MG tablet Take 220 mg by mouth daily as needed.     . naratriptan (AMERGE) 2.5 MG tablet Take 2.5 mg by mouth once as needed for migraine. Once a day as needed    .  Omega-3 Fatty Acids (OMEGA 3 PO) Take by mouth.    . vitamin E 100 UNIT capsule Take 100 Units by mouth daily.    Marland Kitchen lamoTRIgine (LAMICTAL) 100 MG tablet Take 1 tab po qd x 2 weeks, then increase to 1.5 tabs po qd 45 tablet 0  . lisdexamfetamine (VYVANSE) 70 MG capsule Take 1 capsule (70 mg total) by mouth daily. 15 capsule 0  . lisdexamfetamine (VYVANSE) 70 MG capsule Take 1 capsule (70 mg total) by mouth daily. (Patient not taking: Reported on 12/17/2019) 30  capsule 0  . lurasidone (LATUDA) 40 MG TABS tablet Take 1 tablet (40 mg total) by mouth daily with breakfast. 7 tablet 0  . Lurasidone HCl (LATUDA) 60 MG TABS Take 1 tablet (60 mg total) by mouth daily with supper. 30 tablet 0  . metFORMIN (GLUCOPHAGE-XR) 500 MG 24 hr tablet SMARTSIG:1 Tablet(s) By Mouth Every Evening    . nabumetone (RELAFEN) 750 MG tablet Take 750 mg by mouth daily as needed.     . NON FORMULARY "Relief"- Magnesium,Zinc, Rhodola, inositol, berberine, gardenia, banaba extract, et    . OVER THE COUNTER MEDICATION Cyclene    . traZODone (DESYREL) 100 MG tablet Take 1/2-1 tablet po QHS prn insomnia (Patient not taking: Reported on 12/17/2019) 30 tablet 0  . ziprasidone (GEODON) 20 MG capsule Take 1 capsule (20 mg total) by mouth 2 (two) times daily with a meal. 60 capsule 0   No current facility-administered medications for this visit.    Medication Side Effects: Other: Involuntary movements in LE, fatigue, increased glucose  Allergies:  Allergies  Allergen Reactions  . Wellbutrin Xl [Bupropion]     Past Medical History:  Diagnosis Date  . Asthma   . Bursitis   . H/O: depression   . Migraines   . Pica     Family History  Problem Relation Age of Onset  . Diabetes Mother   . Anxiety disorder Mother   . Diabetes Father   . Hypertension Father   . Asthma Brother   . ADD / ADHD Brother   . Diabetes Brother   . Anxiety disorder Maternal Aunt   . Anxiety disorder Maternal Aunt   . ADD / ADHD Son     Social History   Socioeconomic History  . Marital status: Divorced    Spouse name: Not on file  . Number of children: 2  . Years of education: Not on file  . Highest education level: Not on file  Occupational History  . Occupation: customer service  Tobacco Use  . Smoking status: Never Smoker  . Smokeless tobacco: Never Used  Substance and Sexual Activity  . Alcohol use: No  . Drug use: Never  . Sexual activity: Not Currently    Partners: Male  Other  Topics Concern  . Not on file  Social History Narrative  . Not on file   Social Determinants of Health   Financial Resource Strain:   . Difficulty of Paying Living Expenses:   Food Insecurity:   . Worried About Charity fundraiser in the Last Year:   . Arboriculturist in the Last Year:   Transportation Needs:   . Film/video editor (Medical):   Marland Kitchen Lack of Transportation (Non-Medical):   Physical Activity:   . Days of Exercise per Week:   . Minutes of Exercise per Session:   Stress:   . Feeling of Stress :   Social Connections:   . Frequency of Communication  with Friends and Family:   . Frequency of Social Gatherings with Friends and Family:   . Attends Religious Services:   . Active Member of Clubs or Organizations:   . Attends Archivist Meetings:   Marland Kitchen Marital Status:   Intimate Partner Violence:   . Fear of Current or Ex-Partner:   . Emotionally Abused:   Marland Kitchen Physically Abused:   . Sexually Abused:     Past Medical History, Surgical history, Social history, and Family history were reviewed and updated as appropriate.   Please see review of systems for further details on the patient's review from today.   Objective:   Physical Exam:  BP 124/84   Pulse 89   Physical Exam Constitutional:      General: She is not in acute distress. Musculoskeletal:        General: No deformity.  Neurological:     Mental Status: She is alert and oriented to person, place, and time.     Coordination: Coordination normal.  Psychiatric:        Attention and Perception: Perception normal. She is inattentive. She does not perceive auditory or visual hallucinations.        Mood and Affect: Mood is anxious and depressed. Affect is labile. Affect is not blunt, angry or inappropriate.        Speech: Speech normal.        Behavior: Behavior normal.        Thought Content: Thought content normal. Thought content is not paranoid or delusional. Thought content does not include  homicidal or suicidal ideation. Thought content does not include homicidal or suicidal plan.        Cognition and Memory: Cognition and memory normal.        Judgment: Judgment normal.     Comments: Insight intact     Lab Review:  No results found for: NA, K, CL, CO2, GLUCOSE, BUN, CREATININE, CALCIUM, PROT, ALBUMIN, AST, ALT, ALKPHOS, BILITOT, GFRNONAA, GFRAA     Component Value Date/Time   WBC 12.8 (H) 05/30/2009 0528   RBC 2.82 (L) 05/30/2009 0528   HGB 8.2 DELTA CHECK NOTED (L) 05/30/2009 0528   HCT 24.3 (L) 05/30/2009 0528   PLT 188 05/30/2009 0528   MCV 86.3 05/30/2009 0528   MCHC 33.8 05/30/2009 0528   RDW 14.8 05/30/2009 0528    No results found for: POCLITH, LITHIUM   No results found for: PHENYTOIN, PHENOBARB, VALPROATE, CBMZ   .res Assessment: Plan:   Pt seen for 30 minutes and time spend counseling pt re: tx alternatives to Risperdal since pt has noticed worsening mood lability, binge eating, and elevated glucose levels since starting Risperdal. Discussed trial of Geodon since insurance is requiring trial of generic medications before covering [place]. Discussed resuming Latuda samples if no improvement or tolerability issues with Geodon due to severity of current s/s and pt continuing to be unable to work due to mood disturbance.  Pt plans to try to fil Vyvanse since this has been most effective for her ADD s/s.  Will continue to increase Lamictal to 100 mg po qd for 2 weeks, then increase to 150 mg po qd for mood stabilization.  Pt to f/u in 4 weeks or sooner if clinically indicated.  Recommend continuing therapy with Sammuel Cooper, LCSW.  Patient advised to contact office with any questions, adverse effects, or acute worsening in signs and symptoms.  Sheila James was seen today for other, anxiety, insomnia and add.  Diagnoses and all orders  for this visit:  Mood disorder due to known physiological condition with mixed features -     lamoTRIgine (LAMICTAL) 100 MG tablet;  Take 1 tab po qd x 2 weeks, then increase to 1.5 tabs po qd -     lurasidone (LATUDA) 40 MG TABS tablet; Take 1 tablet (40 mg total) by mouth daily with breakfast. -     ziprasidone (GEODON) 20 MG capsule; Take 1 capsule (20 mg total) by mouth 2 (two) times daily with a meal. -     Lurasidone HCl (LATUDA) 60 MG TABS; Take 1 tablet (60 mg total) by mouth daily with supper.  Generalized anxiety disorder  PMDD (premenstrual dysphoric disorder)  Attention deficit hyperactivity disorder (ADHD), combined type  Insomnia, unspecified type     Please see After Visit Summary for patient specific instructions.  Future Appointments  Date Time Provider [place]  12/25/2019  1:00 PM Barnie Del, LCSW [facility] None  12/26/2019  8:30 AM Thayer Headings, PMHNP [facility] None  01/01/2020  1:00 PM Barnie Del, LCSW [facility] None  01/08/2020  8:00 AM Barnie Del, LCSW [facility] None  01/15/2020  8:00 AM Barnie Del, LCSW [facility] None  01/17/2020  2:30 PM Thayer Headings, PMHNP [facility] None  01/22/2020  8:00 AM Barnie Del, LCSW [facility] None  01/29/2020  8:00 AM Barnie Del, LCSW [facility] None  02/05/2020  8:00 AM Barnie Del, LCSW [facility] None  02/12/2020  8:00 AM Barnie Del, LCSW [facility] None    No orders of the defined types were placed in this encounter.   -------------------------------

## 2019-12-18 ENCOUNTER — Ambulatory Visit: Payer: No Typology Code available for payment source | Admitting: Addiction (Substance Use Disorder)

## 2019-12-19 DIAGNOSIS — Z0289 Encounter for other administrative examinations: Secondary | ICD-10-CM

## 2019-12-24 ENCOUNTER — Telehealth: Payer: Self-pay | Admitting: Psychiatry

## 2019-12-24 NOTE — Telephone Encounter (Signed)
Pt checking status on paperwork. Was faxed 6/17

## 2019-12-25 ENCOUNTER — Telehealth: Payer: Self-pay | Admitting: Psychiatry

## 2019-12-25 ENCOUNTER — Other Ambulatory Visit: Payer: Self-pay

## 2019-12-25 ENCOUNTER — Ambulatory Visit (INDEPENDENT_AMBULATORY_CARE_PROVIDER_SITE_OTHER): Payer: No Typology Code available for payment source | Admitting: Addiction (Substance Use Disorder)

## 2019-12-25 ENCOUNTER — Encounter: Payer: Self-pay | Admitting: Addiction (Substance Use Disorder)

## 2019-12-25 DIAGNOSIS — F902 Attention-deficit hyperactivity disorder, combined type: Secondary | ICD-10-CM

## 2019-12-25 DIAGNOSIS — F0634 Mood disorder due to known physiological condition with mixed features: Secondary | ICD-10-CM

## 2019-12-25 DIAGNOSIS — F3281 Premenstrual dysphoric disorder: Secondary | ICD-10-CM | POA: Diagnosis not present

## 2019-12-25 MED ORDER — LISDEXAMFETAMINE DIMESYLATE 70 MG PO CAPS: 70.0000 mg | ORAL_CAPSULE | Freq: Every day | ORAL | 0 refills | Status: DC

## 2019-12-25 NOTE — Progress Notes (Signed)
      [facility] Counselor/Therapist Progress Note  Patient ID: NARI VANNATTER, MRN: [id number],    Date: 12/25/2019  Time Spent: 1:07-2:00 53 mins  Treatment Type: Individual Therapy  Reported Symptoms: crying episodes, tearful, anxious.    Mental Status Exam:  Appearance:   Neat     Behavior:  Appropriate and Sharing  Motor:  Normal  Speech/Language:   Clear and Coherent and Pressured  Affect:  Appropriate and Full Range  Mood:  anxious and labile   Thought process:  circumstantial and concrete    Thought content:    Rumination  Sensory/Perceptual disturbances:    WNL  Orientation:  x4  Attention:  Good  Concentration:  Fair  Memory:  WNL  Fund of knowledge:   Good  Insight:    Good  Judgment:   Good  Impulse Control:  Good   Risk Assessment: Danger to Self:  No Self-injurious Behavior: No Danger to Others: No Duty to Warn:no Physical Aggression / Violence:No  Access to Firearms a concern: No  Gang Involvement:No   Subjective: Client reported having crying episodes and being overly anxious. Client reported less insomnia and some mood stability and less episodes of irritability. Therapist used MI & SFT with client to assess for her wellbeing and work with client to establish solutions for her struggles. Clients goal includes increased motivation and increased efficacy of her MH medication and worked with therapist using SFT. Therapist assessed for safety and stability and client denied SI/HI/AVH and hope of improvement and healing. Client reported feeling much more positive about trying to balance out her mental health. Therapist used MI with client to help her work through the stages of change to acceptance of having to use all these medicines and methods of coping to help her stabilize in her life. Client made progress and reported she has accepted her need to take all her medications and not change things or delay in taking care of her daily MH/health needs.    Interventions: Motivational Interviewing and Solution-Oriented/Positive Psychology  Diagnosis:   ICD-10-CM   1. Mood disorder due to known physiological condition with mixed features  F06.34   2. PMDD (premenstrual dysphoric disorder)  F32.81    Plan of Care: Client to return for weekly therapy with Sammuel Cooper, therapist, to review again in 6 months. Client to engage in positive self talk and challenging negative internal ruminationsand self talk causing client to be overly anxious and worriedusing CBT, on daily practice. Client to engage in mindfulness: ie body scans each eveneing to help process and discharge emotional distress & recognize emotions. Client to utilize BSP (brainspotting) with therapist to help client regulate their anxiety in a somatic- felt body sense way: (ieby working to reducemuscle tension,ruminations,increased heart rate, constant worrying and feeling"hyper" ) by decreasing anxietyby 33% in the next 6 months. Client to prioritize sleep 8+ hours each week night AEB going to bed by 10pm each night.  Barnie Del, LCSW, LCAS, CCTP, CCS-I, BSP

## 2019-12-25 NOTE — Telephone Encounter (Signed)
Pended for Janett Billow to submit

## 2019-12-25 NOTE — Telephone Encounter (Signed)
Pt called [facility] in [facility] doesn't have a Rx for Vyvanse 70 mg 1/d. Please send new Rx.

## 2019-12-25 NOTE — Telephone Encounter (Signed)
DISABILITY PAPERS RECEIVED FROM [employer].

## 2019-12-26 ENCOUNTER — Telehealth: Payer: Self-pay | Admitting: Psychiatry

## 2019-12-26 ENCOUNTER — Ambulatory Visit: Payer: No Typology Code available for payment source | Admitting: Psychiatry

## 2019-12-26 DIAGNOSIS — F902 Attention-deficit hyperactivity disorder, combined type: Secondary | ICD-10-CM

## 2019-12-26 MED ORDER — METHYLPHENIDATE HCL 10 MG PO TABS: 10.0000 mg | ORAL_TABLET | Freq: Two times a day (BID) | ORAL | 0 refills | Status: DC

## 2019-12-26 NOTE — Telephone Encounter (Signed)
Insurance requires pt to have tried and failed Ritalin prior to covering Vyvanse. Script sent for Ritalin.

## 2019-12-26 NOTE — Telephone Encounter (Signed)
Left patient voicemail with information and to call back with any questions.

## 2020-01-01 ENCOUNTER — Ambulatory Visit (INDEPENDENT_AMBULATORY_CARE_PROVIDER_SITE_OTHER): Payer: No Typology Code available for payment source | Admitting: Addiction (Substance Use Disorder)

## 2020-01-01 ENCOUNTER — Telehealth: Payer: Self-pay

## 2020-01-01 ENCOUNTER — Encounter: Payer: Self-pay | Admitting: Addiction (Substance Use Disorder)

## 2020-01-01 ENCOUNTER — Other Ambulatory Visit: Payer: Self-pay

## 2020-01-01 DIAGNOSIS — F3281 Premenstrual dysphoric disorder: Secondary | ICD-10-CM

## 2020-01-01 DIAGNOSIS — F0634 Mood disorder due to known physiological condition with mixed features: Secondary | ICD-10-CM | POA: Diagnosis not present

## 2020-01-01 NOTE — Progress Notes (Signed)
°      [facility] Counselor/Therapist Progress Note  Patient ID: SHARRY BEINING, MRN: [id number],    Date: 01/01/2020  Time Spent: 1:06-2:00 54 mins  Treatment Type: Individual Therapy  Reported Symptoms: organized, less dysregulated, less anxious about work.    Mental Status Exam:  Appearance:   Neat     Behavior:  Appropriate and Sharing  Motor:  Normal  Speech/Language:   Clear and Coherent and Pressured  Affect:  Appropriate and Full Range  Mood:  anxious and labile   Thought process:  circumstantial and concrete    Thought content:    Rumination  Sensory/Perceptual disturbances:    WNL  Orientation:  x4  Attention:  Good  Concentration:  Fair  Memory:  WNL  Fund of knowledge:   Good  Insight:    Good  Judgment:   Good  Impulse Control:  Good   Risk Assessment: Danger to Self:  No Self-injurious Behavior: No Danger to Others: No Duty to Warn:no Physical Aggression / Violence:No  Access to Firearms a concern: No  Gang Involvement:No   Subjective: Client reported feeling more organized, making her less anxious about going back to work in July. Client working with Thayer Headings for continued short term disability due to covid complications and MH issues caused by PMDD. Therapist used MI with client to support her and continue providing affirmations as she prepares to return to work. Client feeling less dysregulated and having less crying episodes and more motivations on her new medications. Client processed having to accept taking a bunch of medications and therapist used MI & RPT with client to help her accept that and keep to her RPT plan of self care for her [place]. Client making progress and is much more motivated to engage in her PMDD care plan. Therapist assessed for safety and stability and discussed progress. Client denied SI/HI/AVH and reported more ability to function emotionally.   Interventions: Motivational Interviewing and RPT  Diagnosis:   ICD-10-CM    1. Mood disorder due to known physiological condition with mixed features  F06.34   2. PMDD (premenstrual dysphoric disorder)  F32.81    Plan of Care: Client to return for weekly therapy with Sammuel Cooper, therapist, to review again in 6 months. Client to engage in positive self talk and challenging negative internal ruminationsand self talk causing client to be overly anxious and worriedusing CBT, on daily practice. Client to engage in mindfulness: ie body scans each eveneing to help process and discharge emotional distress & recognize emotions. Client to utilize BSP (brainspotting) with therapist to help client regulate their anxiety in a somatic- felt body sense way: (ieby working to reducemuscle tension,ruminations,increased heart rate, constant worrying and feeling"hyper" ) by decreasing anxietyby 33% in the next 6 months. Client to prioritize sleep 8+ hours each week night AEB going to bed by 10pm each night.  Barnie Del, LCSW, LCAS, CCTP, CCS-I, BSP

## 2020-01-01 NOTE — Telephone Encounter (Signed)
Tried to submit another Prior authorization for Vyvanse 70 mg, this had been previously denied on 10/01/2019, patient is still required to try Ritalin. Jessica aware and 2 week Rx submitted to pharmacy for patient to try.  No further PA's can be submitted for this medication, it will have to be sent for an appeal because it previously has a denial with [facility].

## 2020-01-02 ENCOUNTER — Telehealth: Payer: Self-pay | Admitting: Psychiatry

## 2020-01-02 DIAGNOSIS — F902 Attention-deficit hyperactivity disorder, combined type: Secondary | ICD-10-CM

## 2020-01-02 MED ORDER — LISDEXAMFETAMINE DIMESYLATE 70 MG PO CAPS: 70.0000 mg | ORAL_CAPSULE | Freq: Every day | ORAL | 0 refills | Status: DC

## 2020-01-02 NOTE — Telephone Encounter (Signed)
Pt called concerning her Vyvanse refill.

## 2020-01-02 NOTE — Telephone Encounter (Signed)
She stated Vyvanse was sent for a 30 day supply. Per conversation with Janett Billow Pt would like only 1wk sent due to cost. Fill at the [facility].

## 2020-01-03 ENCOUNTER — Telehealth: Payer: Self-pay | Admitting: Psychiatry

## 2020-01-03 DIAGNOSIS — F902 Attention-deficit hyperactivity disorder, combined type: Secondary | ICD-10-CM

## 2020-01-03 MED ORDER — LISDEXAMFETAMINE DIMESYLATE 70 MG PO CAPS: 70.0000 mg | ORAL_CAPSULE | Freq: Every day | ORAL | 0 refills | Status: DC

## 2020-01-03 NOTE — Telephone Encounter (Signed)
Patient called and said that she got it approved so now she can have a 30 day supply of her vyvanse sent to the pharmacy instead of seven days worth

## 2020-01-08 ENCOUNTER — Ambulatory Visit: Payer: No Typology Code available for payment source | Admitting: Addiction (Substance Use Disorder)

## 2020-01-14 ENCOUNTER — Ambulatory Visit (INDEPENDENT_AMBULATORY_CARE_PROVIDER_SITE_OTHER): Payer: No Typology Code available for payment source | Admitting: Addiction (Substance Use Disorder)

## 2020-01-14 ENCOUNTER — Other Ambulatory Visit: Payer: Self-pay

## 2020-01-14 ENCOUNTER — Encounter: Payer: Self-pay | Admitting: Addiction (Substance Use Disorder)

## 2020-01-14 DIAGNOSIS — F0634 Mood disorder due to known physiological condition with mixed features: Secondary | ICD-10-CM | POA: Diagnosis not present

## 2020-01-14 NOTE — Progress Notes (Signed)
      [facility] Counselor/Therapist Progress Note  Patient ID: Sheila James, MRN: [id number],    Date: 01/14/2020  Time Spent: 2:13- 3:10 57 mins  Treatment Type: Individual Therapy  Reported Symptoms: organized, less dysregulated, less anxious about work.    Mental Status Exam:  Appearance:   Casual     Behavior:  Appropriate and Sharing  Motor:  Normal  Speech/Language:   Clear and Coherent and Normal Rate  Affect:  Appropriate and Full Range  Mood:  angry and sad & fearful   Thought process:  normal    Thought content:    Rumination  Sensory/Perceptual disturbances:    WNL  Orientation:  x4  Attention:  Good  Concentration:  Fair  Memory:  WNL  Fund of knowledge:   Good  Insight:    Good  Judgment:   Good  Impulse Control:  Good   Risk Assessment: Danger to Self:  No Self-injurious Behavior: No Danger to Others: No Duty to Warn:no Physical Aggression / Violence:No  Access to Firearms a concern: No  Gang Involvement:No   Subjective: Client processed feeling angry and fearful. Client became tearful when discussing the lack of communication and emotional support her bf gives her. Therapist used MI &CBT with client to support her and help her explore how that makes her feel. Client talked about her struggle with trusting her bf who is saying things to pacify her, but is unable to communicate his feelings to her. Client made progress identifying that she feels abandoned and alone when he does this, touching a wound of hers. Therapist used SFT and roleplay with client to help her articulate her feelings with her bf and prepare for how to discuss the issue shes having and look for solutions. Therapist assessed and client denied SI/HI/AVH.   Interventions: Cognitive Behavioral Therapy, Roleplay, Motivational Interviewing and Solution-Oriented/Positive Psychology  Diagnosis:   ICD-10-CM   1. Mood disorder due to known physiological condition with mixed features   F06.34    Plan of Care: Client to return for weekly therapy with Sammuel Cooper, therapist, to review again in 6 months. Client to engage in positive self talk and challenging negative internal ruminationsand self talk causing client to be overly anxious and worriedusing CBT, on daily practice. Client to engage in mindfulness: ie body scans each eveneing to help process and discharge emotional distress & recognize emotions. Client to utilize BSP (brainspotting) with therapist to help client regulate their anxiety in a somatic- felt body sense way: (ieby working to reducemuscle tension,ruminations,increased heart rate, constant worrying and feeling"hyper" ) by decreasing anxietyby 33% in the next 6 months. Client to prioritize sleep 8+ hours each week night AEB going to bed by 10pm each night.  Barnie Del, LCSW, LCAS, CCTP, CCS-I, BSP

## 2020-01-15 ENCOUNTER — Ambulatory Visit: Payer: No Typology Code available for payment source | Admitting: Addiction (Substance Use Disorder)

## 2020-01-17 ENCOUNTER — Other Ambulatory Visit: Payer: Self-pay

## 2020-01-17 ENCOUNTER — Ambulatory Visit (INDEPENDENT_AMBULATORY_CARE_PROVIDER_SITE_OTHER): Payer: No Typology Code available for payment source | Admitting: Psychiatry

## 2020-01-17 ENCOUNTER — Encounter: Payer: Self-pay | Admitting: Psychiatry

## 2020-01-17 DIAGNOSIS — F0634 Mood disorder due to known physiological condition with mixed features: Secondary | ICD-10-CM | POA: Diagnosis not present

## 2020-01-17 DIAGNOSIS — F902 Attention-deficit hyperactivity disorder, combined type: Secondary | ICD-10-CM

## 2020-01-17 DIAGNOSIS — G4700 Insomnia, unspecified: Secondary | ICD-10-CM | POA: Diagnosis not present

## 2020-01-17 MED ORDER — LISDEXAMFETAMINE DIMESYLATE 70 MG PO CAPS: 70.0000 mg | ORAL_CAPSULE | Freq: Every day | ORAL | 0 refills | Status: DC

## 2020-01-17 MED ORDER — LAMOTRIGINE 150 MG PO TABS: 150.0000 mg | ORAL_TABLET | Freq: Every day | ORAL | 0 refills | Status: DC

## 2020-01-17 MED ORDER — OLANZAPINE 5 MG PO TABS: 5.0000 mg | ORAL_TABLET | Freq: Every day | ORAL | 0 refills | Status: DC

## 2020-01-17 MED ORDER — TRAZODONE HCL 100 MG PO TABS: ORAL_TABLET | ORAL | 2 refills | Status: DC

## 2020-01-17 NOTE — Progress Notes (Signed)
Sheila James [id number] Jul 02, 1976 44 y.o.  Subjective:   Patient ID:  Sheila James is a 44 y.o. (DOB 12-11-1975) female.  Chief Complaint:  Chief Complaint  Patient presents with  . Follow-up    h/o Depression, anxiety, PMDD, and ADD    HPI Sheila James presents to the office today for follow-up of mood distrubance, anxiety, ADD, and PMDD. She reports that she has been taking Lamictal 100 mg po qd. She reports that she is feeling better.  She reports that her mood has been "good." She reports that her mood has been stable during recent Menstrual cycle. She reports that she had some depression in response to situation that she experienced some loss of control "but not completely down and out." Denies irritability even in situations that would usually trigger irritability. Had some anxiety in response to stressful situation. Energy and motivation were slightly lower during that period. She reports that energy and motivation have been adequate overall. Concentration has been adequate and is now able to take Vyvanse. Appetite has been slightly decreased with Vyvanse and continues to eat an adequate amount overall. Denies SI.   Past Psychiatric Medication Trials: Wellbutrin- allergic reaction.  Sertraline Prozac Buspar Lamictal- Some improvement in depressive s/s outside of pre-menstrual time Latuda Abilify- helpful for depression.Wt. Gain. Risperdal Geodon Adderall XR Vyvanse Concerta- Ineffective (short duration and minimal improvement) Melatonin- Ineffective Trazodone  AIMS     Office Visit from 11/13/2019 in [facility]  AIMS Total Score 0       Review of Systems:  Review of Systems  Cardiovascular: Negative for palpitations.  Musculoskeletal: Negative for gait problem.  Neurological: Negative for tremors.  Psychiatric/Behavioral:       Please refer to HPI    Medications: I have reviewed the patient's current  medications.  Current Outpatient Medications  Medication Sig Dispense Refill  . B Complex Vitamins (B COMPLEX PO) Take by mouth.    . ferrous sulfate 325 (65 FE) MG tablet Take 325 mg by mouth daily with breakfast.    . Lurasidone HCl (LATUDA) 60 MG TABS Take 1 tablet (60 mg total) by mouth daily with supper. 30 tablet 0  . metFORMIN (GLUCOPHAGE-XR) 500 MG 24 hr tablet SMARTSIG:1 Tablet(s) By Mouth Every Evening    . Multiple Minerals-Vitamins (CALCIUM-MAGNESIUM-ZINC-D3 PO) Take by mouth.    . Multiple Vitamin (MULTIVITAMIN) tablet Take 1 tablet by mouth daily.    . nabumetone (RELAFEN) 750 MG tablet Take 750 mg by mouth daily as needed.     . naproxen sodium (ALEVE) 220 MG tablet Take 220 mg by mouth daily as needed.     . NON FORMULARY "Relief"- Magnesium,Zinc, Rhodola, inositol, berberine, gardenia, banaba extract, et    . Omega-3 Fatty Acids (OMEGA 3 PO) Take by mouth.    Marland Kitchen OVER THE COUNTER MEDICATION Cyclene    . vitamin E 100 UNIT capsule Take 100 Units by mouth daily.    Marland Kitchen lamoTRIgine (LAMICTAL) 150 MG tablet Take 1 tablet (150 mg total) by mouth daily. 90 tablet 0  . [START ON 02/01/2020] lisdexamfetamine (VYVANSE) 70 MG capsule Take 1 capsule (70 mg total) by mouth daily. 30 capsule 0  . [START ON 02/29/2020] lisdexamfetamine (VYVANSE) 70 MG capsule Take 1 capsule (70 mg total) by mouth daily. 30 capsule 0  . [START ON 03/28/2020] lisdexamfetamine (VYVANSE) 70 MG capsule Take 1 capsule (70 mg total) by mouth daily. 30 capsule 0  . OLANZapine (ZYPREXA) 5 MG tablet Take 1  tablet (5 mg total) by mouth at bedtime. 7 tablet 0  . traZODone (DESYREL) 100 MG tablet Take 1/2-1 tablet po QHS prn insomnia 30 tablet 2   No current facility-administered medications for this visit.    Medication Side Effects: None  Allergies:  Allergies  Allergen Reactions  . Wellbutrin Xl [Bupropion]     Past Medical History:  Diagnosis Date  . Asthma   . Bursitis   . H/O: depression   . Migraines    . Pica     Family History  Problem Relation Age of Onset  . Diabetes Mother   . Anxiety disorder Mother   . Diabetes Father   . Hypertension Father   . Asthma Brother   . ADD / ADHD Brother   . Diabetes Brother   . Anxiety disorder Maternal Aunt   . Anxiety disorder Maternal Aunt   . ADD / ADHD Son     Social History   Socioeconomic History  . Marital status: Divorced    Spouse name: Not on file  . Number of children: 2  . Years of education: Not on file  . Highest education level: Not on file  Occupational History  . Occupation: customer service  Tobacco Use  . Smoking status: Never Smoker  . Smokeless tobacco: Never Used  Substance and Sexual Activity  . Alcohol use: No  . Drug use: Never  . Sexual activity: Not Currently    Partners: Male  Other Topics Concern  . Not on file  Social History Narrative  . Not on file   Social Determinants of Health   Financial Resource Strain:   . Difficulty of Paying Living Expenses:   Food Insecurity:   . Worried About Charity fundraiser in the Last Year:   . Arboriculturist in the Last Year:   Transportation Needs:   . Film/video editor (Medical):   Marland Kitchen Lack of Transportation (Non-Medical):   Physical Activity:   . Days of Exercise per Week:   . Minutes of Exercise per Session:   Stress:   . Feeling of Stress :   Social Connections:   . Frequency of Communication with Friends and Family:   . Frequency of Social Gatherings with Friends and Family:   . Attends Religious Services:   . Active Member of Clubs or Organizations:   . Attends Archivist Meetings:   Marland Kitchen Marital Status:   Intimate Partner Violence:   . Fear of Current or Ex-Partner:   . Emotionally Abused:   Marland Kitchen Physically Abused:   . Sexually Abused:     Past Medical History, Surgical history, Social history, and Family history were reviewed and updated as appropriate.   Please see review of systems for further details on the patient's  review from today.   Objective:   Physical Exam:  There were no vitals taken for this visit.  Physical Exam Constitutional:      General: She is not in acute distress. Musculoskeletal:        General: No deformity.  Neurological:     Mental Status: She is alert and oriented to person, place, and time.     Coordination: Coordination normal.  Psychiatric:        Attention and Perception: Attention and perception normal. She does not perceive auditory or visual hallucinations.        Mood and Affect: Mood normal. Mood is not anxious or depressed. Affect is not labile, blunt, angry or inappropriate.  Speech: Speech normal.        Behavior: Behavior normal.        Thought Content: Thought content normal. Thought content is not paranoid or delusional. Thought content does not include homicidal or suicidal ideation. Thought content does not include homicidal or suicidal plan.        Cognition and Memory: Cognition and memory normal.        Judgment: Judgment normal.     Comments: Insight intact     Lab Review:  No results found for: NA, K, CL, CO2, GLUCOSE, BUN, CREATININE, CALCIUM, PROT, ALBUMIN, AST, ALT, ALKPHOS, BILITOT, GFRNONAA, GFRAA     Component Value Date/Time   WBC 12.8 (H) 05/30/2009 0528   RBC 2.82 (L) 05/30/2009 0528   HGB 8.2 DELTA CHECK NOTED (L) 05/30/2009 0528   HCT 24.3 (L) 05/30/2009 0528   PLT 188 05/30/2009 0528   MCV 86.3 05/30/2009 0528   MCHC 33.8 05/30/2009 0528   RDW 14.8 05/30/2009 0528    No results found for: POCLITH, LITHIUM   No results found for: PHENYTOIN, PHENOBARB, VALPROATE, CBMZ   .res Assessment: Plan:   Will continue current plan of care since target signs and symptoms are well controlled without any tolerability issues. Pt able to return to work without restrictions at this time; however, she may require intermittent leave up to 24 hours in a 4-week period.  Recommend continuing therapy with Sammuel Cooper, LCSW. Pt to f/u in 3  months or sooner if clinically indicated.  Patient advised to contact office with any questions, adverse effects, or acute worsening in signs and symptoms.  Dandra was seen today for follow-up.  Diagnoses and all orders for this visit:  Mood disorder due to known physiological condition with mixed features -     OLANZapine (ZYPREXA) 5 MG tablet; Take 1 tablet (5 mg total) by mouth at bedtime. -     lamoTRIgine (LAMICTAL) 150 MG tablet; Take 1 tablet (150 mg total) by mouth daily.  Insomnia, unspecified type -     OLANZapine (ZYPREXA) 5 MG tablet; Take 1 tablet (5 mg total) by mouth at bedtime. -     traZODone (DESYREL) 100 MG tablet; Take 1/2-1 tablet po QHS prn insomnia  Attention deficit hyperactivity disorder (ADHD), combined type -     lisdexamfetamine (VYVANSE) 70 MG capsule; Take 1 capsule (70 mg total) by mouth daily. -     lisdexamfetamine (VYVANSE) 70 MG capsule; Take 1 capsule (70 mg total) by mouth daily. -     lisdexamfetamine (VYVANSE) 70 MG capsule; Take 1 capsule (70 mg total) by mouth daily.     Please see After Visit Summary for patient specific instructions.  Future Appointments  Date Time Provider [place]  01/22/2020  8:00 AM Barnie Del, LCSW [facility] None  01/29/2020  8:00 AM Barnie Del, LCSW [facility] None  02/05/2020  8:00 AM Barnie Del, LCSW [facility] None  02/12/2020  8:00 AM Barnie Del, LCSW [facility] None  04/17/2020  1:00 PM Thayer Headings, PMHNP [facility] None    No orders of the defined types were placed in this encounter.   -------------------------------

## 2020-01-20 ENCOUNTER — Telehealth: Payer: Self-pay

## 2020-01-20 NOTE — Telephone Encounter (Signed)
Prior authorization submitted through cover my meds for OLANZAPINE 5 MG TABLETS, approved with [address] [place], [place] [id number] effective 01/20/2020-01/19/2021.

## 2020-01-22 ENCOUNTER — Ambulatory Visit: Payer: No Typology Code available for payment source | Admitting: Addiction (Substance Use Disorder)

## 2020-01-29 ENCOUNTER — Ambulatory Visit: Payer: No Typology Code available for payment source | Admitting: Addiction (Substance Use Disorder)

## 2020-02-01 ENCOUNTER — Telehealth: Payer: Self-pay | Admitting: Psychiatry

## 2020-02-01 DIAGNOSIS — F0634 Mood disorder due to known physiological condition with mixed features: Secondary | ICD-10-CM

## 2020-02-01 DIAGNOSIS — G4700 Insomnia, unspecified: Secondary | ICD-10-CM

## 2020-02-01 MED ORDER — QUETIAPINE FUMARATE 100 MG PO TABS: ORAL_TABLET | ORAL | 0 refills | Status: DC

## 2020-02-01 NOTE — Telephone Encounter (Signed)
Script sent for trial of Seroquel since insurance requires step therapy before covering [place] which has worked well for pt's mood, anxiety, and PMDD s/s. Pt requested 10-day supply for Seroquel trial due to adverse effects and limited efficacy with several other atypical antipsychotics.

## 2020-02-04 ENCOUNTER — Telehealth: Payer: Self-pay

## 2020-02-04 NOTE — Telephone Encounter (Signed)
Prior authorization submitted and approved through cover my meds for QUETIAPINE 100 MG TABLETS 1 DAILY, effective 02/04/2020-02/03/2021 PA# [id number]. [employer] Medicaid

## 2020-02-05 ENCOUNTER — Encounter: Payer: Self-pay | Admitting: Addiction (Substance Use Disorder)

## 2020-02-05 ENCOUNTER — Other Ambulatory Visit: Payer: Self-pay

## 2020-02-05 ENCOUNTER — Ambulatory Visit (INDEPENDENT_AMBULATORY_CARE_PROVIDER_SITE_OTHER): Payer: No Typology Code available for payment source | Admitting: Addiction (Substance Use Disorder)

## 2020-02-05 DIAGNOSIS — F0634 Mood disorder due to known physiological condition with mixed features: Secondary | ICD-10-CM

## 2020-02-05 NOTE — Progress Notes (Signed)
      [facility] Counselor/Therapist Progress Note  Patient ID: Sheila James, MRN: [id number],    Date: 02/05/2020  Time Spent: 78mins  Treatment Type: Individual Therapy  Reported Symptoms: relieved she's back at work, hurt/upset with bf.     Mental Status Exam:  Appearance:   Casual and Neat     Behavior:  Appropriate and Sharing  Motor:  Normal  Speech/Language:   Clear and Coherent and Normal Rate  Affect:  Appropriate and Full Range  Mood:  angry   Thought process:  normal    Thought content:    Rumination  Sensory/Perceptual disturbances:    WNL  Orientation:  x4  Attention:  Good  Concentration:  Fair  Memory:  WNL  Fund of knowledge:   Good  Insight:    Good  Judgment:   Good  Impulse Control:  Good   Risk Assessment: Danger to Self:  No Self-injurious Behavior: No Danger to Others: No Duty to Warn:no Physical Aggression / Violence:No  Access to Firearms a concern: No  Gang Involvement:No   Subjective: Client processed feeling angry and hurt by her bf who continues to fail at communicating his love towards her or giving her words of affirmation or quality time. Therapist used MI to support client and validate her emotions. Client desiring emotional reassurance from her bf and therapist used CBT with client while client processed her thoughts/feelings. Client also processed feeling better since going back to work since having her work space to herself. Therapist assessed and client denied SI/HI/AVH.   Interventions: Cognitive Behavioral Therapy and Motivational Interviewing  Diagnosis:   ICD-10-CM   1. Mood disorder due to known physiological condition with mixed features  F06.34    Plan of Care: Client to return for weekly therapy with Sammuel Cooper, therapist, to review again in 6 months. Client to engage in positive self talk and challenging negative internal ruminationsand self talk causing client to be overly anxious and worriedusing CBT, on  daily practice. Client to engage in mindfulness: ie body scans each eveneing to help process and discharge emotional distress & recognize emotions. Client to utilize BSP (brainspotting) with therapist to help client regulate their anxiety in a somatic- felt body sense way: (ieby working to reducemuscle tension,ruminations,increased heart rate, constant worrying and feeling"hyper" ) by decreasing anxietyby 33% in the next 6 months. Client to prioritize sleep 8+ hours each week night AEB going to bed by 10pm each night.  Barnie Del, LCSW, LCAS, CCTP, CCS-I, BSP

## 2020-02-12 ENCOUNTER — Ambulatory Visit: Payer: No Typology Code available for payment source | Admitting: Addiction (Substance Use Disorder)

## 2020-02-19 ENCOUNTER — Ambulatory Visit: Payer: No Typology Code available for payment source | Admitting: Addiction (Substance Use Disorder)

## 2020-02-26 ENCOUNTER — Telehealth: Payer: Self-pay | Admitting: Psychiatry

## 2020-02-26 NOTE — Telephone Encounter (Signed)
Received Medical Accommodation form from [employer]. Given to nurse.

## 2020-02-27 DIAGNOSIS — Z0289 Encounter for other administrative examinations: Secondary | ICD-10-CM

## 2020-02-27 NOTE — Telephone Encounter (Signed)
Returned call to pt to obtain additional information related to Medical Accomodation Form. Reports that she was out 01/27/20, 01/28/20, 01/29/20, 8/19, 8/20, and 8/23. She reports that fatigue with PMDD was severe and she was unable to work. She reports that flares tend to last 3-4 days with each cycle.

## 2020-02-28 ENCOUNTER — Telehealth: Payer: Self-pay | Admitting: Psychiatry

## 2020-02-28 NOTE — Telephone Encounter (Signed)
Medical accommodation form faxed

## 2020-03-02 NOTE — Telephone Encounter (Signed)
Noted thank you

## 2020-03-04 ENCOUNTER — Other Ambulatory Visit: Payer: Self-pay

## 2020-03-04 ENCOUNTER — Ambulatory Visit (INDEPENDENT_AMBULATORY_CARE_PROVIDER_SITE_OTHER): Payer: No Typology Code available for payment source | Admitting: Addiction (Substance Use Disorder)

## 2020-03-04 ENCOUNTER — Encounter: Payer: Self-pay | Admitting: Addiction (Substance Use Disorder)

## 2020-03-04 DIAGNOSIS — F0634 Mood disorder due to known physiological condition with mixed features: Secondary | ICD-10-CM | POA: Diagnosis not present

## 2020-03-04 NOTE — Progress Notes (Signed)
      [facility] Counselor/Therapist Progress Note  Patient ID: Sheila James, MRN: [id number],    Date: 03/04/2020  Time Spent: 57mins  Treatment Type: Individual Therapy  Reported Symptoms: struggling with cycle fluctuations.   Mental Status Exam:  Appearance:   Casual and Well Groomed     Behavior:  Appropriate, Sharing and Blaming  Motor:  Normal  Speech/Language:   Clear and Coherent and Normal Rate  Affect:  Appropriate and Full Range  Mood:  anxious, irritable and labile   Thought process:  normal    Thought content:    Rumination  Sensory/Perceptual disturbances:    WNL  Orientation:  x4  Attention:  Good  Concentration:  Fair  Memory:  WNL  Fund of knowledge:   Good  Insight:    Good  Judgment:   Good  Impulse Control:  Fair   Risk Assessment: Danger to Self:  No Self-injurious Behavior: No Danger to Others: No Duty to Warn:no Physical Aggression / Violence:No  Access to Firearms a concern: No  Gang Involvement:No   Subjective: Client processed the increased mood fluctuations during her cycle and having no flexibility/understanding from her job to miss days as needed. Client had to ask dr for completion of Medical Accomodation Form for excused absences of 6 days (3-4 days each cycle). Therapist used MI & CBT with client to support client in processing her distress and struggle in her Luteal phase while also talking about her thoughts about her disorder and symptoms to help give her continued hope and ability to keep fighting her MH symptoms. Client additionally processed the stressors related to a failed relationship with a bf and her thoughts/feelings plaguing her. Therapist assessed and client denied SI/HI/AVH.   Interventions: Cognitive Behavioral Therapy and Motivational Interviewing  Diagnosis:   ICD-10-CM   1. Mood disorder due to known physiological condition with mixed features  F06.34     Plan of Care: Client to return for weekly therapy  with Sammuel Cooper, therapist, to review again in 6 months. Client to engage in positive self talk and challenging negative internal ruminationsand self talk causing client to be overly anxious and worriedusing CBT, on daily practice. Client to engage in mindfulness: ie body scans each eveneing to help process and discharge emotional distress & recognize emotions. Client to utilize BSP (brainspotting) with therapist to help client regulate their anxiety in a somatic- felt body sense way: (ieby working to reducemuscle tension,ruminations,increased heart rate, constant worrying and feeling"hyper" ) by decreasing anxietyby 33% in the next 6 months. Client to prioritize sleep 8+ hours each week night AEB going to bed by 10pm each night.  Barnie Del, LCSW, LCAS, CCTP, CCS-I, BSP

## 2020-03-18 ENCOUNTER — Ambulatory Visit: Payer: No Typology Code available for payment source | Admitting: Addiction (Substance Use Disorder)

## 2020-03-30 ENCOUNTER — Telehealth: Payer: Self-pay

## 2020-03-30 NOTE — Telephone Encounter (Signed)
Prior authorization submitted and approved for LATUDA 60 MG effective 03/30/2020-03/30/2021 with [employer] PA# [id number]

## 2020-04-01 ENCOUNTER — Ambulatory Visit: Payer: No Typology Code available for payment source | Admitting: Addiction (Substance Use Disorder)

## 2020-04-15 ENCOUNTER — Other Ambulatory Visit: Payer: Self-pay

## 2020-04-15 ENCOUNTER — Encounter: Payer: Self-pay | Admitting: Addiction (Substance Use Disorder)

## 2020-04-15 ENCOUNTER — Ambulatory Visit (INDEPENDENT_AMBULATORY_CARE_PROVIDER_SITE_OTHER): Payer: No Typology Code available for payment source | Admitting: Addiction (Substance Use Disorder)

## 2020-04-15 DIAGNOSIS — F3281 Premenstrual dysphoric disorder: Secondary | ICD-10-CM

## 2020-04-15 DIAGNOSIS — F0634 Mood disorder due to known physiological condition with mixed features: Secondary | ICD-10-CM

## 2020-04-15 NOTE — Progress Notes (Signed)
      [facility] Counselor/Therapist Progress Note  Patient ID: Sheila James, MRN: [id number],    Date: 04/15/2020  Time Spent: 36mins  Treatment Type: Individual Therapy  Reported Symptoms: more stable.   Mental Status Exam:  Appearance:   Casual and Well Groomed     Behavior:  Appropriate, Sharing and Blaming  Motor:  Normal  Speech/Language:   Clear and Coherent and Normal Rate  Affect:  Appropriate and Full Range  Mood:  normal   Thought process:  normal    Thought content:    Rumination  Sensory/Perceptual disturbances:    WNL  Orientation:  x4  Attention:  Good  Concentration:  Fair  Memory:  WNL  Fund of knowledge:   Good  Insight:    Fair  Judgment:   Good  Impulse Control:  Fair   Risk Assessment: Danger to Self:  No Self-injurious Behavior: No Danger to Others: No Duty to Warn:no Physical Aggression / Violence:No  Access to Firearms a concern: No  Gang Involvement:No   Subjective: Client processed feeling much more mentally stable esp since being able to take her meds that medicaid finally started to pay for. Client reported no big depression dips and just getting more adjusted to work again. Client processed her relationship with her bf and her desire for marriage and healthy emotional attachments. Therapist used MI & CBT with client to support her and validate her desires while also encouraging continued personal development/stabilization and conflict resolution practice with her bf. Client made progress identifying and stating her need for getting affection from her bf and her bf was receptive and provided regulation for her nervous system. Therapist assessed and client denied SI/HI/AVH.   Interventions: Cognitive Behavioral Therapy and Motivational Interviewing  Diagnosis:   ICD-10-CM   1. PMDD (premenstrual dysphoric disorder)  F32.81   2. Mood disorder due to known physiological condition with mixed features  F06.34      Plan of  Care: Client to return for weekly therapy with Sammuel Cooper, therapist, to review again in 6 months. Client to engage in positive self talk and challenging negative internal ruminationsand self talk causing client to be overly anxious and worriedusing CBT, on daily practice. Client to engage in mindfulness: ie body scans each eveneing to help process and discharge emotional distress & recognize emotions. Client to utilize BSP (brainspotting) with therapist to help client regulate their anxiety in a somatic- felt body sense way: (ieby working to reducemuscle tension,ruminations,increased heart rate, constant worrying and feeling"hyper" ) by decreasing anxietyby 33% in the next 6 months. Client to prioritize sleep 8+ hours each week night AEB going to bed by 10pm each night.  Barnie Del, LCSW, LCAS, CCTP, CCS-I, BSP

## 2020-04-16 ENCOUNTER — Other Ambulatory Visit: Payer: Self-pay

## 2020-04-16 DIAGNOSIS — F0634 Mood disorder due to known physiological condition with mixed features: Secondary | ICD-10-CM

## 2020-04-16 MED ORDER — LAMOTRIGINE 150 MG PO TABS: 150.0000 mg | ORAL_TABLET | Freq: Every day | ORAL | 0 refills | Status: DC

## 2020-04-17 ENCOUNTER — Ambulatory Visit: Payer: No Typology Code available for payment source | Admitting: Psychiatry

## 2020-04-29 ENCOUNTER — Encounter: Payer: Self-pay | Admitting: Addiction (Substance Use Disorder)

## 2020-04-29 ENCOUNTER — Other Ambulatory Visit: Payer: Self-pay

## 2020-04-29 ENCOUNTER — Ambulatory Visit (INDEPENDENT_AMBULATORY_CARE_PROVIDER_SITE_OTHER): Payer: No Typology Code available for payment source | Admitting: Addiction (Substance Use Disorder)

## 2020-04-29 DIAGNOSIS — F3281 Premenstrual dysphoric disorder: Secondary | ICD-10-CM | POA: Diagnosis not present

## 2020-04-29 NOTE — Progress Notes (Signed)
      [facility] Counselor/Therapist Progress Note  Patient ID: Sheila James, MRN: [id number],    Date: 04/29/2020  Time Spent: 23mins  Treatment Type: Individual Therapy  Reported Symptoms: tired.  Mental Status Exam:  Appearance:   Casual and Well Groomed     Behavior:  Appropriate and Sharing  Motor:  Normal  Speech/Language:   Clear and Coherent and Normal Rate  Affect:  Appropriate and Full Range  Mood:  normal   Thought process:  normal    Thought content:    Rumination  Sensory/Perceptual disturbances:    WNL  Orientation:  x4  Attention:  Good  Concentration:  Fair  Memory:  WNL  Fund of knowledge:   Good  Insight:    Fair  Judgment:   Good  Impulse Control:  Fair   Risk Assessment: Danger to Self:  No Self-injurious Behavior: No Danger to Others: No Duty to Warn:no Physical Aggression / Violence:No  Access to Firearms a concern: No  Gang Involvement:No   Subjective: Client processed having trouble with a decreased energy and motivation during the last few days, causing her to feel somewhat defeated in her depression battle. Client reported having trouble focusing and needing to talk with her NP to help her make sure her medications are still working for her. Therapist used MI and CBT with client to support her in processing her feelings and discuss her thoughts about causes for the change in her moods/etc. Client processed other conflicts in her life that are causing feelings of defeat and worked with client using SFT to help her consider options for next steps she could take to help her gain some autonomy, independence, and help her feel more stable in her home life environment also. Therapist assessed for client stability and safety, and client denied SI/HI/AVH and increased depression.   Interventions: Cognitive Behavioral Therapy, Motivational Interviewing and Solution-Oriented/Positive Psychology  Diagnosis:   ICD-10-CM   1. PMDD (premenstrual  dysphoric disorder)  F32.81     Plan of Care: Client to return for weekly therapy with Sammuel Cooper, therapist, to review again in 6 months. Client to engage in positive self talk and challenging negative internal ruminationsand self talk causing client to be overly anxious and worriedusing CBT, on daily practice. Client to engage in mindfulness: ie body scans each eveneing to help process and discharge emotional distress & recognize emotions. Client to utilize BSP (brainspotting) with therapist to help client regulate their anxiety in a somatic- felt body sense way: (ieby working to reducemuscle tension,ruminations,increased heart rate, constant worrying and feeling"hyper" ) by decreasing anxietyby 33% in the next 6 months. Client to prioritize sleep 8+ hours each week night AEB going to bed by 10pm each night.  Barnie Del, LCSW, LCAS, CCTP, CCS-I, BSP

## 2020-05-13 ENCOUNTER — Ambulatory Visit: Payer: No Typology Code available for payment source | Admitting: Addiction (Substance Use Disorder)

## 2020-05-20 ENCOUNTER — Telehealth: Payer: Self-pay | Admitting: Psychiatry

## 2020-05-20 NOTE — Telephone Encounter (Signed)
Pt would like a refill on vyvanse 70mg . Pt made appt for 07/18/20. Please send to [facility] on [address].

## 2020-05-21 ENCOUNTER — Other Ambulatory Visit: Payer: Self-pay

## 2020-05-21 DIAGNOSIS — F902 Attention-deficit hyperactivity disorder, combined type: Secondary | ICD-10-CM

## 2020-05-21 MED ORDER — LISDEXAMFETAMINE DIMESYLATE 70 MG PO CAPS: 70.0000 mg | ORAL_CAPSULE | Freq: Every day | ORAL | 0 refills | Status: DC

## 2020-05-21 NOTE — Telephone Encounter (Signed)
Last apt 01/2020, no show 04/17/20  Scheduled 07/09/19  Okay to pend?

## 2020-05-21 NOTE — Telephone Encounter (Signed)
Rx's pended. 

## 2020-05-27 ENCOUNTER — Ambulatory Visit: Payer: No Typology Code available for payment source | Admitting: Addiction (Substance Use Disorder)

## 2020-06-03 ENCOUNTER — Other Ambulatory Visit (HOSPITAL_BASED_OUTPATIENT_CLINIC_OR_DEPARTMENT_OTHER): Payer: Self-pay | Admitting: Family Medicine

## 2020-06-03 DIAGNOSIS — D1802 Hemangioma of intracranial structures: Secondary | ICD-10-CM

## 2020-06-08 ENCOUNTER — Telehealth: Payer: Self-pay | Admitting: Psychiatry

## 2020-06-08 DIAGNOSIS — F0634 Mood disorder due to known physiological condition with mixed features: Secondary | ICD-10-CM

## 2020-06-08 MED ORDER — LATUDA 60 MG PO TABS: 60.0000 mg | ORAL_TABLET | Freq: Every day | ORAL | 1 refills | Status: DC

## 2020-06-08 NOTE — Telephone Encounter (Signed)
Sheila James called to report that she is experiencing extreme fatigue.  You have prescribed Sertraline for this before.  She would a new prescription sent into her pharmacy.  If you fill something else would be better just advise her which medication you will prescribe for this.  She also needs a prescription for her Latuda.  She is taking 60 mg.  Appt 07/08/20.  Send to [facility] on [address], [place].

## 2020-06-08 NOTE — Telephone Encounter (Signed)
Please review

## 2020-06-08 NOTE — Telephone Encounter (Signed)
Left detailed message with information and to call office back in the morning for further questions or concerns.

## 2020-06-10 ENCOUNTER — Ambulatory Visit: Payer: No Typology Code available for payment source | Admitting: Addiction (Substance Use Disorder)

## 2020-06-13 ENCOUNTER — Ambulatory Visit (HOSPITAL_BASED_OUTPATIENT_CLINIC_OR_DEPARTMENT_OTHER): Payer: No Typology Code available for payment source

## 2020-06-16 ENCOUNTER — Telehealth: Payer: Self-pay | Admitting: Psychiatry

## 2020-06-16 NOTE — Telephone Encounter (Signed)
Patient's next visit is 07/08/20. She needs a refill on her Vyvanse called to [facility] at [address], [place], [place]. Phone # is [phone]. She also states that she continues to fill very fatigued all the time and is wondering if she can try Acetylacaritine which is a vitamin supplement for fatigue. She wants to make sure it wont interact with her other meds. Please call her at [phone]. She hasn't been able to work because of the fatigue she has been having.

## 2020-06-17 ENCOUNTER — Telehealth: Payer: Self-pay | Admitting: Psychiatry

## 2020-06-17 NOTE — Telephone Encounter (Signed)
Patient walked in a disability and leave healthcare provider statement from [facility] to have completed. She will pick it up.

## 2020-06-20 ENCOUNTER — Ambulatory Visit (HOSPITAL_BASED_OUTPATIENT_CLINIC_OR_DEPARTMENT_OTHER): Payer: No Typology Code available for payment source

## 2020-06-22 ENCOUNTER — Other Ambulatory Visit (HOSPITAL_BASED_OUTPATIENT_CLINIC_OR_DEPARTMENT_OTHER): Payer: Self-pay | Admitting: Family Medicine

## 2020-06-22 DIAGNOSIS — E049 Nontoxic goiter, unspecified: Secondary | ICD-10-CM

## 2020-06-24 ENCOUNTER — Ambulatory Visit: Payer: No Typology Code available for payment source | Admitting: Addiction (Substance Use Disorder)

## 2020-06-28 ENCOUNTER — Other Ambulatory Visit: Payer: Self-pay

## 2020-06-28 ENCOUNTER — Ambulatory Visit (HOSPITAL_BASED_OUTPATIENT_CLINIC_OR_DEPARTMENT_OTHER)
Admission: RE | Admit: 2020-06-28 | Discharge: 2020-06-28 | Disposition: A | Discharge status: Home / Self Care | Payer: No Typology Code available for payment source | Source: Ambulatory Visit | Attending: Family Medicine | Admitting: Family Medicine

## 2020-06-28 DIAGNOSIS — D1802 Hemangioma of intracranial structures: Secondary | ICD-10-CM

## 2020-06-28 DIAGNOSIS — E049 Nontoxic goiter, unspecified: Secondary | ICD-10-CM | POA: Diagnosis present

## 2020-06-28 MED ORDER — GADOBUTROL 1 MMOL/ML IV SOLN
10.0000 mL | Freq: Once | INTRAVENOUS | Status: AC | PRN
  Administered 2020-06-28: 10 mL via INTRAVENOUS

## 2020-07-08 ENCOUNTER — Other Ambulatory Visit: Payer: Self-pay

## 2020-07-08 ENCOUNTER — Encounter: Payer: Self-pay | Admitting: Psychiatry

## 2020-07-08 ENCOUNTER — Ambulatory Visit (INDEPENDENT_AMBULATORY_CARE_PROVIDER_SITE_OTHER): Payer: No Typology Code available for payment source | Admitting: Addiction (Substance Use Disorder)

## 2020-07-08 ENCOUNTER — Ambulatory Visit (INDEPENDENT_AMBULATORY_CARE_PROVIDER_SITE_OTHER): Payer: No Typology Code available for payment source | Admitting: Psychiatry

## 2020-07-08 VITALS — BP 141/84 | HR 90

## 2020-07-08 DIAGNOSIS — F3281 Premenstrual dysphoric disorder: Secondary | ICD-10-CM

## 2020-07-08 DIAGNOSIS — R5383 Other fatigue: Secondary | ICD-10-CM | POA: Diagnosis not present

## 2020-07-08 DIAGNOSIS — G4700 Insomnia, unspecified: Secondary | ICD-10-CM | POA: Diagnosis not present

## 2020-07-08 DIAGNOSIS — F902 Attention-deficit hyperactivity disorder, combined type: Secondary | ICD-10-CM | POA: Diagnosis not present

## 2020-07-08 DIAGNOSIS — F0634 Mood disorder due to known physiological condition with mixed features: Secondary | ICD-10-CM | POA: Diagnosis not present

## 2020-07-08 MED ORDER — LISDEXAMFETAMINE DIMESYLATE 70 MG PO CAPS: 70.0000 mg | ORAL_CAPSULE | Freq: Every day | ORAL | 0 refills | Status: DC

## 2020-07-08 MED ORDER — LAMOTRIGINE 150 MG PO TABS: 150.0000 mg | ORAL_TABLET | Freq: Every day | ORAL | 0 refills | Status: DC

## 2020-07-08 MED ORDER — LATUDA 60 MG PO TABS: 60.0000 mg | ORAL_TABLET | Freq: Every day | ORAL | 1 refills | Status: DC

## 2020-07-08 MED ORDER — DOXEPIN HCL 3 MG PO TABS: ORAL_TABLET | ORAL | 0 refills | Status: DC

## 2020-07-08 MED ORDER — LISDEXAMFETAMINE DIMESYLATE 70 MG PO CAPS
70.0000 mg | ORAL_CAPSULE | Freq: Every day | ORAL | 0 refills | Status: DC
Start: 2020-09-10 — End: 2020-10-07

## 2020-07-08 NOTE — Progress Notes (Signed)
LATORI MURTY [id number] 12/02/1975 45 y.o.  Subjective:   Patient ID:  Sheila James is a 45 y.o. (DOB 01-12-1976) female.  Chief Complaint:  Chief Complaint  Patient presents with   Insomnia   Fatigue    HPI Sheila James presents to the office today for follow-up of mood disturbance, anxiety, and ADHD.  She reports, "I'm struggling to stay alive." She reports that she has constant fatigue. She reports that severe fatigue only occurs during the luteal phase of her menstrual cycle. She reports that in November she was falling to sleep at work during her luteal phase. She reports, "now I can't sleep" and that she is fearful about taking Trazodone since she has had excessive somnolence with taking a 1/4 tablet.    She reports that her mood has improved and is no longer depressed. She reports that her mood was depressed in October and November. She continues to experience severe fatigue. She reports that concentration and confusion have improved. Confusion resolved in mid-to late December. She reports that she has been feeling better since starting Acetyl-carnitine. Denies elevated moods. Appetite during luteal phase is increased and will have food cravings. She reports weight gain. Anxiety has been controlled. She reports that she had some SI in November and December. Denies current SI.   Has been on leave since November 18th and leave officially started on December 1st. Has not been able to return to work. She reports that s/s with her luteal phase last 14 days.   Past Psychiatric Medication Trials: Wellbutrin- allergic reaction.  Sertraline Prozac Buspar Lamictal- Some improvement in depressive s/s outside of pre-menstrual time Latuda Abilify- helpful for depression.Wt. Gain. Risperdal Geodon Adderall XR Vyvanse Concerta- Ineffective (short duration and minimal improvement) Melatonin- Ineffective Trazodone  AIMS   Flowsheet Row Office Visit from  11/13/2019 in [facility]  AIMS Total Score 0       Review of Systems:  Review of Systems  Gastrointestinal: Negative.        Reports that she has some bloating and gas during menses  Genitourinary:       Reports recent follow-up with obgyn. Plans to start progesterone.  Musculoskeletal: Negative for gait problem.  Neurological: Positive for headaches.  Psychiatric/Behavioral:       Please refer to HPI     She reports that she was sick in October with severe HA's, unsteadiness, dizziness, severe fatigue, confusion, and possible insect bites. She reports that she was negative for COVID and [place] fever. She started taking Doxycycline and noticed she felt better. When she stopped doxycycline her s/s returned. She reports that she saw several specialists (Opthamology, ENT, headache specialists) and had an MRI. She reports at least 10 medical apts from late October to late December. She reports that her headaches were unrelieved with multiple medications. She reports periods of hypersomnia. Typically has 1-2 headaches during her luteal phase. See Laurin Coder at [facility] Obyn/gynecology. Will be starting Progesterone any day.   Medications: I have reviewed the patient's current medications.  Current Outpatient Medications  Medication Sig Dispense Refill   ACETYLCARNITINE PO Take by mouth.     B Complex Vitamins (B COMPLEX PO) Take by mouth.     Doxepin HCl (SILENOR) 3 MG TABS Take 3-6 mg po QHS 12 tablet 0   ferrous sulfate 325 (65 FE) MG tablet Take 325 mg by mouth daily with breakfast.     metFORMIN (GLUCOPHAGE-XR) 500 MG 24 hr tablet SMARTSIG:1 Tablet(s) By  Mouth Every Evening     Multiple Minerals-Vitamins (CALCIUM-MAGNESIUM-ZINC-D3 PO) Take by mouth.     Multiple Vitamin (MULTIVITAMIN) tablet Take 1 tablet by mouth daily.     nabumetone (RELAFEN) 750 MG tablet Take 750 mg by mouth daily as needed.      naproxen sodium (ALEVE) 220 MG tablet Take 220 mg  by mouth daily as needed.      Omega-3 Fatty Acids (OMEGA 3 PO) Take by mouth.     vitamin E 100 UNIT capsule Take 100 Units by mouth daily.     atorvastatin (LIPITOR) 20 MG tablet Take 20 mg by mouth daily.     ketorolac (TORADOL) 10 MG tablet Take 10 mg by mouth every 6 (six) hours as needed.     lamoTRIgine (LAMICTAL) 150 MG tablet Take 1 tablet (150 mg total) by mouth daily. 90 tablet 0   [START ON 09/10/2020] lisdexamfetamine (VYVANSE) 70 MG capsule Take 1 capsule (70 mg total) by mouth daily. 30 capsule 0   [START ON 08/13/2020] lisdexamfetamine (VYVANSE) 70 MG capsule Take 1 capsule (70 mg total) by mouth daily. 30 capsule 0   [START ON 07/16/2020] lisdexamfetamine (VYVANSE) 70 MG capsule Take 1 capsule (70 mg total) by mouth daily. 30 capsule 0   Lurasidone HCl (LATUDA) 60 MG TABS Take 1 tablet (60 mg total) by mouth daily with supper. 30 tablet 1   NON FORMULARY "Relief"- Magnesium,Zinc, Rhodola, inositol, berberine, gardenia, banaba extract, et     ondansetron (ZOFRAN) 4 MG tablet Take 4 mg by mouth 3 (three) times daily as needed.     OVER THE COUNTER MEDICATION Cyclene     traZODone (DESYREL) 100 MG tablet Take 1/2-1 tablet po QHS prn insomnia (Patient not taking: Reported on 07/08/2020) 30 tablet 2   No current facility-administered medications for this visit.    Medication Side Effects: None  Allergies:  Allergies  Allergen Reactions   Wellbutrin Xl [Bupropion]     Past Medical History:  Diagnosis Date   Asthma    Bursitis    Diabetes mellitus, type II (HCC)    Elevated cholesterol    Fatigue    H/O: depression    Migraines    Pica     Family History  Problem Relation Age of Onset   Diabetes Mother    Anxiety disorder Mother    Diabetes Father    Hypertension Father    Asthma Brother    ADD / ADHD Brother    Diabetes Brother    Anxiety disorder Maternal Aunt    Anxiety disorder Maternal Aunt    ADD / ADHD Son     Social  History   Socioeconomic History   Marital status: Divorced    Spouse name: Not on file   Number of children: 2   Years of education: Not on file   Highest education level: Not on file  Occupational History   Occupation: customer service  Tobacco Use   Smoking status: Never Smoker   Smokeless tobacco: Never Used  Substance and Sexual Activity   Alcohol use: No   Drug use: Never   Sexual activity: Not Currently    Partners: Male  Other Topics Concern   Not on file  Social History Narrative   Not on file   Social Determinants of Health   Financial Resource Strain: Not on file  Food Insecurity: Not on file  Transportation Needs: Not on file  Physical Activity: Not on file  Stress: Not on file  Social Connections: Not on file  Intimate Partner Violence: Not on file    Past Medical History, Surgical history, Social history, and Family history were reviewed and updated as appropriate.   Please see review of systems for further details on the patient's review from today.   Objective:   Physical Exam:  BP (!) 141/84    Pulse 90   Physical Exam Constitutional:      General: She is not in acute distress. Musculoskeletal:        General: No deformity.  Neurological:     Mental Status: She is alert and oriented to person, place, and time.     Coordination: Coordination normal.  Psychiatric:        Attention and Perception: Perception normal. She does not perceive auditory or visual hallucinations.        Mood and Affect: Mood is anxious. Mood is not depressed. Affect is not labile, blunt, angry or inappropriate.        Speech: Speech is rapid and pressured.        Behavior: Behavior normal. Behavior is cooperative.        Thought Content: Thought content normal. Thought content is not paranoid or delusional. Thought content does not include homicidal or suicidal ideation. Thought content does not include homicidal or suicidal plan.        Cognition and Memory:  Cognition and memory normal.        Judgment: Judgment normal.     Comments: Insight intact Circumstantial at times     Lab Review:  No results found for: NA, K, CL, CO2, GLUCOSE, BUN, CREATININE, CALCIUM, PROT, ALBUMIN, AST, ALT, ALKPHOS, BILITOT, GFRNONAA, GFRAA     Component Value Date/Time   WBC 12.8 (H) 05/30/2009 0528   RBC 2.82 (L) 05/30/2009 0528   HGB 8.2 DELTA CHECK NOTED (L) 05/30/2009 0528   HCT 24.3 (L) 05/30/2009 0528   PLT 188 05/30/2009 0528   MCV 86.3 05/30/2009 0528   MCHC 33.8 05/30/2009 0528   RDW 14.8 05/30/2009 0528    No results found for: POCLITH, LITHIUM   No results found for: PHENYTOIN, PHENOBARB, VALPROATE, CBMZ   .res Assessment: Plan:   Pt seen for 30 minutes and time spent counseling pt and reviewing recent medical s/s and PMDD. Pt reports that mood and anxiety s/s have improved since she is no longer in the luteal phase. She reports that obgyn has recently prescribed progesterone and she will start this at the onset of her next menstrual cycle to improve PMDD. Plan is to coordinate care with pt's obgyn and PCP. Will also staff case with Dr. Clovis Pu.  Discussed potential benefits, risks, and side effects of Silenor. Discussed that Learned may be helpful for insomnia with less risk of causing excessive somnolence compared to Trazodone. Pt provided with samples of Silenor 3 mg and 6 mg. Advised pt to contact office regarding response to [facility] and script can be sent in based on response and tolerability.  Consider checking Vit D level to rule out Vitamin D deficiency as cause of excessive fatigue. May also consider obtaining Ammonia level to rule out elevated ammonia level as cause of fatigue.  Recommend leave of absence for 2 additional months.   Amethyst was seen today for insomnia and fatigue.  Diagnoses and all orders for this visit:  Insomnia, unspecified type -     Doxepin HCl (SILENOR) 3 MG TABS; Take 3-6 mg po QHS  Mood disorder due to known  physiological condition  with mixed features -     lamoTRIgine (LAMICTAL) 150 MG tablet; Take 1 tablet (150 mg total) by mouth daily. -     Lurasidone HCl (LATUDA) 60 MG TABS; Take 1 tablet (60 mg total) by mouth daily with supper.  Attention deficit hyperactivity disorder (ADHD), combined type -     lisdexamfetamine (VYVANSE) 70 MG capsule; Take 1 capsule (70 mg total) by mouth daily. -     lisdexamfetamine (VYVANSE) 70 MG capsule; Take 1 capsule (70 mg total) by mouth daily. -     lisdexamfetamine (VYVANSE) 70 MG capsule; Take 1 capsule (70 mg total) by mouth daily.     Please see After Visit Summary for patient specific instructions.  Future Appointments  Date Time Provider Department Center  07/16/2020 11:00 AM Lyda Perone, RD [facility]  07/24/2020 11:00 AM Pauline Good, LCSW [facility] None  08/06/2020  9:00 AM Pauline Good, LCSW [facility] None  08/11/2020  1:30 PM Penumalli, Glenford Bayley, MD [facility] None  08/17/2020  1:30 PM Romero Belling, MD [facility] None  08/19/2020  3:00 PM Pauline Good, LCSW [facility] None  08/25/2020  1:30 PM Corie Chiquito, PMHNP [facility] None  09/02/2020  2:00 PM Pauline Good, LCSW [facility] None  09/16/2020  2:00 PM Pauline Good, LCSW [facility] None  09/30/2020  2:00 PM Pauline Good, LCSW [facility] None    No orders of the defined types were placed in this encounter.   -------------------------------

## 2020-07-10 DIAGNOSIS — R5383 Other fatigue: Secondary | ICD-10-CM | POA: Insufficient documentation

## 2020-07-10 DIAGNOSIS — F32A Depression, unspecified: Secondary | ICD-10-CM | POA: Insufficient documentation

## 2020-07-10 NOTE — Progress Notes (Signed)
      [facility] Counselor/Therapist Progress Note  Patient ID: MILIANA GANGWER, MRN: [id number],    Date: 07/10/2020  Time Spent: 59mins  Treatment Type: Individual Therapy  Reported Symptoms: tearful, dysregulated, fatigued.   Mental Status Exam:  Appearance:   Casual and Well Groomed     Behavior:  Appropriate and Sharing  Motor:  Normal  Speech/Language:   Clear and Coherent and Normal Rate  Affect:  Appropriate, Congruent and Tearful  Mood:  depressed, labile and sad   Thought process:  flight of ideas    Thought content:    Obsessions and Rumination  Sensory/Perceptual disturbances:    WNL  Orientation:  x4  Attention:  Good  Concentration:  Fair  Memory:  WNL  Fund of knowledge:   Good  Insight:    Fair  Judgment:   Fair  Impulse Control:  Fair   Risk Assessment: Danger to Self:  No Self-injurious Behavior: No Danger to Others: No Duty to Warn:no Physical Aggression / Violence:No  Access to Firearms a concern: No  Gang Involvement:No   Subjective: Client reported feeling dysregulated, and super fatigued. Client became very tearful and reported her hopelessness that this would all subside: her depression and fatigue. Client looking for trigger for her fatigue and inability to get out of bed/take good care of herself but forgetting how depression could be affecting this. Therapist assessed for safety of client and client's sons. Client denied SI/HI/AVH and reported taking care of all her son's needs but just not feeling well herself. Therapist encouraged client to continue having a conversation with the doctor about her meds as she increases/changes them with her. Therapist used MI and RPT with client to support her in processing her feelings, support her, encourage her, and help her take small steps for her recovery of MH. Therapist assessed for client stability and safety, and client denied SI/HI/AVH and increased depression.   Interventions: Cognitive  Behavioral Therapy, Motivational Interviewing and RPT  Diagnosis:   ICD-10-CM   1. Mood disorder due to known physiological condition with mixed features  F06.34   2. PMDD (premenstrual dysphoric disorder)  F32.81      Plan of Care: Client to return for weekly therapy with Sammuel Cooper, therapist, to review again in 6 months. Client to engage in positive self talk and challenging negative internal ruminationsand self talk causing client to be overly anxious and worriedusing CBT, on daily practice. Client to engage in mindfulness: ie body scans each eveneing to help process and discharge emotional distress & recognize emotions. Client to utilize BSP (brainspotting) with therapist to help client regulate their anxiety in a somatic- felt body sense way: (ieby working to reducemuscle tension,ruminations,increased heart rate, constant worrying and feeling"hyper" ) by decreasing anxietyby 33% in the next 6 months. Client to prioritize sleep 8+ hours each week night AEB going to bed by 10pm each night.  Barnie Del, LCSW, LCAS, CCTP, CCS-I, BSP

## 2020-07-14 ENCOUNTER — Telehealth: Payer: Self-pay | Admitting: Psychiatry

## 2020-07-14 DIAGNOSIS — G4700 Insomnia, unspecified: Secondary | ICD-10-CM

## 2020-07-14 DIAGNOSIS — R5383 Other fatigue: Secondary | ICD-10-CM

## 2020-07-14 DIAGNOSIS — Z79899 Other long term (current) drug therapy: Secondary | ICD-10-CM

## 2020-07-14 MED ORDER — DOXEPIN HCL 10 MG PO CAPS: 10.0000 mg | ORAL_CAPSULE | Freq: Every evening | ORAL | 2 refills | Status: DC | PRN

## 2020-07-14 NOTE — Telephone Encounter (Signed)
Sheila James called following to see if the disability form from [name] has been completed. She was last seen on 07/08/20. She wanted me to followup. Thanks

## 2020-07-14 NOTE — Telephone Encounter (Signed)
Returned call to pt. She reports that Silenor 6 mg is helpful for her sleep and is not groggy upon awakening. She reports that it is taking awhile to fall asleep.  Prescription sent for doxepin 10 mg at bedtime for insomnia.  Started menstrual cycle on 07/09/20 and is continuing to experience menstrual bleeding. She reports that her iron has been low despite supplementation with ferrous sulfate. Has been feeling tired.   Sees Laurin Coder at [facility]. PCP is Marda Stalker, PA at [facility] on [address].  Case staffed today with Dr. Clovis Pu.  Will order Vitamin D level to r/o Vit D deficiency as a cause of low energy.  We will also order an ammonia level to rule out elevated level as cause of fatigue, hypersomnolence, and recent increased confusion.  Discussed taking Latuda 60 mg daily and increasing to 80 mg daily between ovulation and onset of menses to treat PMDD signs and symptoms.  Patient advised to contact office with any questions, adverse effects, or acute worsening in signs and symptoms.  Will follow-up on disability form.

## 2020-07-15 DIAGNOSIS — Z0289 Encounter for other administrative examinations: Secondary | ICD-10-CM

## 2020-07-15 NOTE — Telephone Encounter (Signed)
Forms completed and updated with most recent phone call adjustments, will have provider review and sign

## 2020-07-16 ENCOUNTER — Encounter: Payer: No Typology Code available for payment source | Admitting: Registered"

## 2020-07-16 ENCOUNTER — Encounter: Payer: No Typology Code available for payment source | Attending: Family Medicine | Admitting: Registered"

## 2020-07-16 ENCOUNTER — Other Ambulatory Visit: Payer: Self-pay

## 2020-07-16 DIAGNOSIS — E1165 Type 2 diabetes mellitus with hyperglycemia: Secondary | ICD-10-CM | POA: Diagnosis present

## 2020-07-16 DIAGNOSIS — E119 Type 2 diabetes mellitus without complications: Secondary | ICD-10-CM

## 2020-07-16 NOTE — Patient Instructions (Signed)
Goals:  Follow Diabetes Meal Plan as instructed  Eat 3 meals and 2 snacks, every 3-5 hrs  Aim to have 1/2 plate non-starch vegetables + 1/4 plate protein + 1/4 plate starch/grain with lunch and dinner.   Add lean protein foods to meals/snacks  Monitor glucose levels as instructed by your doctor  Aim for 30 mins of physical activity daily  Bring food record and glucose log to your next nutrition visit

## 2020-07-16 NOTE — Progress Notes (Signed)
Diabetes Self-Management Education  Visit Type:  First/Initial  Appt. Start Time: 11:07 Appt. End Time: 12:35  07/16/2020  [name], identified by name and date of birth, is a 45 y.o. female with a diagnosis of Diabetes: Type 2.   ASSESSMENT  Pt expectations: none stated  Referral contains recent labs: elevated Cholesterol 217, LDL 152, HDL 58, elevated B12 1067.  States she takes ACL for energy. Reports history of PMDD. States she has been out of work since 05/2020. Reports PMDD flares up and last at least 2 weeks/month. States she will have extreme fatigue. Reports she does not take metformin as prescribed because she is not always at home when its to eat.   States she has done some research on diabetes.org. Has been having almonds + apple as snack option. States she will have something that will have a little fat in it. States some days are better than others. Reports she has made some dietary changes such as eating brown rice instead of white rice, sweet potatoes instead of white potatoes, and has increased vegetable intake.  States she is trying to do better. 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 States she has been trying to meal plan for family but sometimes she can't keep self from eating sugary snacks. States she read an article where doctor "beating" diabetes. States she is not seeing weight changes and it is a slight disappointment.  States she exercises occasionally. States she is winded when she shouldn't be and has gained a lot of weight. States she hates gyms, prefers to workout outside. States she has exercise equipment at home: spin bike, stepper, row bike, and elliptical. Reports she will exercise 30-70 min, 3 days/week.   States she started checking BS 1/8 after being educated. States she tracks BS numbers in her journal. States she is checking BS once/day multiple days/week: FBS (175-188).  States both her parents have/had diabetes.  States she has 2 sons (ages 76 and 73). Reports she will return to work in mid-to-late March.   There were no vitals taken for this visit. There is no height or weight on file to calculate BMI.    Diabetes Self-Management Education - 07/16/20 1119      Health Coping   How would you rate your overall health? Fair      Psychosocial Assessment   Patient Belief/Attitude about Diabetes Motivated to manage diabetes   also not too hopeful   Self-care barriers None    Special Needs None    Preferred Learning Style No preference indicated    Learning Readiness Change in progress      Complications   Last HgB A1C per patient/outside source 8.3 %    How often do you check your blood sugar? 1-2 times/day    Fasting Blood glucose range (mg/dL) 180-200    Number of hypoglycemic episodes per month 0    Number of hyperglycemic episodes per week 0    Have you had a dilated eye exam in the past 12 months? Yes    Have you had a dental exam in the past 12 months? Yes    Are you checking your feet? Yes    How many days per week are you checking your feet? 7      Dietary Intake   Breakfast raisin bran + whole milk    Lunch skipped due to having multiple appointments    Dinner 2 slices of cheese pizza + ceasar salad (  lettuce, parmesan cheese, croutons, ceasar dressing)    Snack (evening) apple    Beverage(s) water      Exercise   Exercise Type Moderate (swimming / aerobic walking)    How many days per week to you exercise? 3    How many minutes per day do you exercise? 60    Total minutes per week of exercise 180      Patient Education   Previous Diabetes Education No    Disease state  Definition of diabetes, type 1 and 2, and the diagnosis of diabetes;Factors that contribute to the development of diabetes    Nutrition management  Effects of alcohol on blood glucose and safety factors with consumption of alcohol.;Reviewed blood glucose goals for pre and post meals and how to evaluate the  patients' food intake on their blood glucose level.;Food label reading, portion sizes and measuring food.;Role of diet in the treatment of diabetes and the relationship between the three main macronutrients and blood glucose level    Physical activity and exercise  Role of exercise on diabetes management, blood pressure control and cardiac health.    Medications Reviewed patients medication for diabetes, action, purpose, timing of dose and side effects.    Monitoring Purpose and frequency of SMBG.;Daily foot exams;Yearly dilated eye exam;Identified appropriate SMBG and/or A1C goals.;Interpreting lab values - A1C, lipid, urine microalbumina.;Taught/discussed recording of test results and interpretation of SMBG.    Acute complications Taught treatment of hypoglycemia - the 15 rule.;Discussed and identified patients' treatment of hyperglycemia.    Chronic complications Relationship between chronic complications and blood glucose control;Lipid levels, blood glucose control and heart disease;Assessed and discussed foot care and prevention of foot problems;Dental care;Retinopathy and reason for yearly dilated eye exams;Reviewed with patient heart disease, higher risk of, and prevention;Applicable immunizations    Psychosocial adjustment Role of stress on diabetes;Identified and addressed patients feelings and concerns about diabetes;Brainstormed with patient on coping mechanisms for social situations, getting support from significant others, dealing with feelings about diabetes    Personal strategies to promote health Lifestyle issues that need to be addressed for better diabetes care      Individualized Goals (developed by patient)   Nutrition General guidelines for healthy choices and portions discussed    Physical Activity Exercise 3-5 times per week;60 minutes per day    Medications take my medication as prescribed    Monitoring  test my blood glucose as discussed    Reducing Risk examine blood glucose  patterns;do foot checks daily;treat hypoglycemia with 15 grams of carbs if blood glucose less than 70mg /dL;increase portions of nuts and seeds      Post-Education Assessment   Patient understands the diabetes disease and treatment process. Demonstrates understanding / competency    Patient understands incorporating nutritional management into lifestyle. Needs Review    Patient undertands incorporating physical activity into lifestyle. Demonstrates understanding / competency    Patient understands using medications safely. Demonstrates understanding / competency    Patient understands monitoring blood glucose, interpreting and using results Demonstrates understanding / competency    Patient understands prevention, detection, and treatment of acute complications. Demonstrates understanding / competency    Patient understands prevention, detection, and treatment of chronic complications. Needs Review    Patient understands how to develop strategies to address psychosocial issues. Demonstrates understanding / competency    Patient understands how to develop strategies to promote health/change behavior. Demonstrates understanding / competency      Outcomes   Program Status Not Completed  Learning Objective:  Patient will have a greater understanding of diabetes self-management. Patient education plan is to attend individual and/or group sessions per assessed needs and concerns.   Plan:   Follow Diabetes Meal Plan as instructed  Eat 3 meals and 2 snacks, every 3-5 hrs  Aim to have 1/2 plate non-starch vegetables + 1/4 plate protein + 1/4 plate starch/grain with lunch and dinner.   Add lean protein foods to meals/snacks  Monitor glucose levels as instructed by your doctor  Aim for 30 mins of physical activity daily  Bring food record and glucose log to your next nutrition visit   Expected Outcomes:  Demonstrated interest in learning. Expect positive outcomes  Education  material provided: ADA - How to Thrive: A Guide for Your Journey with Diabetes  If problems or questions, patient to contact team via:  Phone and Email  Future DSME appointment: - 4-6 wks

## 2020-07-24 ENCOUNTER — Ambulatory Visit: Payer: No Typology Code available for payment source | Admitting: Addiction (Substance Use Disorder)

## 2020-07-28 ENCOUNTER — Ambulatory Visit (INDEPENDENT_AMBULATORY_CARE_PROVIDER_SITE_OTHER): Payer: No Typology Code available for payment source | Admitting: Addiction (Substance Use Disorder)

## 2020-07-28 ENCOUNTER — Encounter: Payer: Self-pay | Admitting: Addiction (Substance Use Disorder)

## 2020-07-28 DIAGNOSIS — F3281 Premenstrual dysphoric disorder: Secondary | ICD-10-CM | POA: Diagnosis not present

## 2020-07-28 DIAGNOSIS — F0634 Mood disorder due to known physiological condition with mixed features: Secondary | ICD-10-CM

## 2020-07-28 NOTE — Progress Notes (Signed)
[facility] Counselor/Therapist Progress Note  Patient ID: Sheila James, MRN: [id number],    Date: 07/28/2020  Time Spent: 70min  Treatment Type: Individual Therapy  Reported Symptoms: sad, frustrated, tired.  Mental Status Exam:  Appearance:   Casual and Well Groomed     Behavior:  Appropriate and Sharing  Motor:  Normal  Speech/Language:   Clear and Coherent and Normal Rate  Affect:  Appropriate and Congruent  Mood:  anxious, labile and sad   Thought process:  circumstantial    Thought content:    Obsessions and Rumination  Sensory/Perceptual disturbances:    WNL  Orientation:  x4  Attention:  Good  Concentration:  Fair  Memory:  WNL  Fund of knowledge:   Good  Insight:    Good  Judgment:   Fair  Impulse Control:  Fair   Risk Assessment: Danger to Self:  No Self-injurious Behavior: No Danger to Others: No Duty to Warn:no Physical Aggression / Violence:No  Access to Firearms a concern: No  Gang Involvement:No   Virtual Visit via VIDEO:  I connected with client by MyChart video enabled telemedicine/telehealth application, with their informed consent, and verified client privacy and that I am speaking with the correct person using two identifiers. I discussed the limitations, risks, security and privacy concerns of performing psychotherapy and management service virtually and confirmed their location. I also discussed with the patient that there may be a patient responsible charge related to this service and to confirm with the front desk if their insurance covers teletherapy. I also discussed with the patient the availability of in person appointments. The patient expressed understanding and agreed to proceed. I discussed the treatment planning with the client. The client was provided an opportunity to ask questions and all were answered. The client agreed with the plan and demonstrated an understanding of the instructions. The client was advised to call our  office if symptoms worsen or feel they are in a crisis state and need immediate contact. Client also reminded of a crisis line number and to use 9-1-1 if there's an emergency.  Therapist Location: home; Client Location: home.  Subjective: Client reported feeling sad, frustrated, tired and anxious. Client feeling overwhelmed with her new diabetes diagnosis and the ways shes having to change her behaviors and lifestyle around food, exercise, etc. Client talked about the ways shes making the necessary changes to help stabilize her blood sugar including monitoring her consumption of carbs but is struggling to force herself to check her blood glucose numbers a few times a day (due to her fear of needles). Therapist used MI & CBT with client to help her change her fears about needles and increase her motivation to check it as needed. Therapist used RPT with client to help client further process detailed RP plans and SMART goals for her health and mental health. Therapist assessed for client stability and safety, and client denied SI/HI/AVH but some stabilizing hormones since beginning to take progesterone.   Interventions: Cognitive Behavioral Therapy, Motivational Interviewing and RPT  Diagnosis:   ICD-10-CM   1. PMDD (premenstrual dysphoric disorder)  F32.81   2. Mood disorder due to known physiological condition with mixed features  F06.34     Plan of Care: Client to return for weekly therapy with Sammuel Cooper, therapist, to review again in 6 months. Client to engage in positive self talk and challenging negative internal ruminationsand self talk causing client to be overly anxious and worriedusing CBT, on daily practice.  Client to engage in mindfulness: ie body scans each eveneing to help process and discharge emotional distress & recognize emotions. Client to utilize BSP (brainspotting) with therapist to help client regulate their anxiety in a somatic- felt body sense way: (ieby working to  reducemuscle tension,ruminations,increased heart rate, constant worrying and feeling"hyper" ) by decreasing anxietyby 33% in the next 6 months. Client to prioritize sleep 8+ hours each week night AEB going to bed by 10pm each night.  Barnie Del, LCSW, LCAS, CCTP, CCS-I, BSP

## 2020-08-03 ENCOUNTER — Encounter: Payer: Self-pay | Admitting: Addiction (Substance Use Disorder)

## 2020-08-03 NOTE — Progress Notes (Unsigned)
[facility] Counselor/Therapist Progress Note  Patient ID: Sheila James, MRN: [id number],    Date: 07/24/2020  Time Spent:   Treatment Type: Individual Therapy  Reported Symptoms: overwhelmed, upset.  Mental Status Exam:  Appearance:   NA     Behavior:  Appropriate and Sharing  Motor:  Normal  Speech/Language:   Clear and Coherent and Normal Rate  Affect:  Appropriate and Congruent  Mood:  anxious and sad   Thought process:  normal    Thought content:    Obsessions and Rumination  Sensory/Perceptual disturbances:    WNL  Orientation:  x4  Attention:  Good  Concentration:  Fair  Memory:  WNL  Fund of knowledge:   Good  Insight:    Fair  Judgment:   Good  Impulse Control:  Fair   Risk Assessment: Danger to Self:  No Self-injurious Behavior: No Danger to Others: No Duty to Warn:no Physical Aggression / Violence:No  Access to Firearms a concern: No  Gang Involvement:No   Virtual Visit via TELEPHONE : I connected with client by telephone, with their informed consent, and verified client privacy and that I am speaking with the correct person using two identifiers. I discussed the limitations, risks, security and privacy concerns of performing psychotherapy and management service virtually and confirmed their location. I also discussed with the patient that there may be a patient responsible charge related to this service and to confirm with the front desk if their insurance covers teletherapy. I also discussed with the patient the availability of in person appointments. The patient expressed understanding and agreed to proceed. I discussed the treatment planning with the client. The client was provided an opportunity to ask questions and all were answered. The client agreed with the plan and demonstrated an understanding of the instructions. The client was advised to call our office if symptoms worsen or feel they are in a crisis state and need immediate contact.  Client also reminded of a crisis line number and to use 9-1-1 if there's an emergency.  Therapist Location: home; Client Location: home.  Subjective: Client reported issues with her most recent diabetes diagnosis along with issues with her PMDD affecting her ability to keep her life in order and take care of her health. Client struggling to care well for herself healthwise, but very motivated to create strategies to help her keep up with monitoring her sugar and caring for herself mood-wise also. Therapist assessed for safety and client reported the ways shes attempting to care for herself using SFT. Client denied SI/HI/AVH and reported continued use of her MH meds also to help her health. Client discussed ways she is working on controlling her diabetes, food & exercise-wise along with ways shes monitoring her sugars. Therapist using MI & RPT with client to encourage her and validate the struggle while helping her think of all necessary components of her RP plan.   Interventions: Motivational Interviewing, Solution-Oriented/Positive Psychology and RPT  Diagnosis:   ICD-10-CM   1. PMDD (premenstrual dysphoric disorder)  F32.81      Plan of Care: Client to return for weekly therapy with Sammuel Cooper, therapist, to review again in 6 months. Client to engage in positive self talk and challenging negative internal ruminationsand self talk causing client to be overly anxious and worriedusing CBT, on daily practice. Client to engage in mindfulness: ie body scans each eveneing to help process and discharge emotional distress & recognize emotions. Client to utilize BSP (brainspotting) with therapist to  help client regulate their anxiety in a somatic- felt body sense way: (ieby working to reducemuscle tension,ruminations,increased heart rate, constant worrying and feeling"hyper" ) by decreasing anxietyby 33% in the next 6 months. Client to prioritize sleep 8+ hours each week night AEB going to bed by  10pm each night.  Barnie Del, LCSW, LCAS, CCTP, CCS-I, BSP

## 2020-08-04 NOTE — Progress Notes (Signed)
Not seen. ERROR

## 2020-08-05 ENCOUNTER — Encounter: Payer: No Typology Code available for payment source | Attending: Family Medicine | Admitting: Registered"

## 2020-08-05 ENCOUNTER — Encounter: Payer: Self-pay | Admitting: Registered"

## 2020-08-05 DIAGNOSIS — E119 Type 2 diabetes mellitus without complications: Secondary | ICD-10-CM | POA: Insufficient documentation

## 2020-08-05 NOTE — Progress Notes (Signed)
Diabetes Self-Management Education  Visit Type:  Follow-up  Appt. Start Time: 11:01 Appt. End Time: 12:00  08/05/2020  [name], identified by name and date of birth, is a 45 y.o. female with a diagnosis of Diabetes: Type 2.   ASSESSMENT  States she had something similar to a panic attack recently therefore has stopped taking medications latuda and atorvastatin. States once she stopped these medications she has been feeling better. Reports she contacted PCP and awaiting a reply.   States food selections are slim in the grocery store and does not know what to cool. States she feels like she is eating the same foods all the time. States she doesn't have the time or energy to change food intake. States she is tired of having to deal with food in general. States she has not been wanting to eat and tired of cooking. States sometimes she can't eat what her children are eating. Reports she cooks 3 different meals because she states children are picky eaters. States its a whole lot to deal with. States toward the end of appointment fatigue is not present and does not feel tired at all. Reports progesterone prescription has helped.    Checking BS numbers once a day: FBS (130-160). Decreased from last appt.   States she has been exercising with spin bike, walking in neighborhood, seated elliptical, and yoga throughout the day. Active 30 min, 2-6 days/week.   There were no vitals taken for this visit. There is no height or weight on file to calculate BMI.    Diabetes Self-Management Education - 08/65/78 4696      Complications   How often do you check your blood sugar? 1-2 times/day    Fasting Blood glucose range (mg/dL) 130-179      Dietary Intake   Breakfast raisin bran or oatmeal or egg + toast + vegetable    Lunch Healthy Choice meal - black bean + chicken + cauliflower rice or spicy Teriyaki or chicken Probation officer sandwich + lettuce and tomato + cake + ice  cream      Exercise   Exercise Type Moderate (swimming / aerobic walking);Light (walking / raking leaves)    How many days per week to you exercise? 4    How many minutes per day do you exercise? 30    Total minutes per week of exercise 120      Patient Education   Previous Diabetes Education No    Chronic complications Retinopathy and reason for yearly dilated eye exams;Identified and discussed with patient  current chronic complications      Individualized Goals (developed by patient)   Physical Activity 30 minutes per day;Exercise 3-5 times per week      Post-Education Assessment   Patient understands the diabetes disease and treatment process. Demonstrates understanding / competency    Patient understands incorporating nutritional management into lifestyle. Demonstrates understanding / competency    Patient undertands incorporating physical activity into lifestyle. Demonstrates understanding / competency    Patient understands using medications safely. Demonstrates understanding / competency    Patient understands monitoring blood glucose, interpreting and using results Demonstrates understanding / competency    Patient understands prevention, detection, and treatment of acute complications. Demonstrates understanding / competency    Patient understands prevention, detection, and treatment of chronic complications. Demonstrates understanding / competency    Patient understands how to develop strategies to address psychosocial issues. Demonstrates understanding / competency    Patient understands how to  develop strategies to promote health/change behavior. Demonstrates understanding / competency      Outcomes   Program Status Completed      Subsequent Visit   Since your last visit have you continued or begun to take your medications as prescribed? No           Learning Objective:  Patient will have a greater understanding of diabetes self-management. Patient education plan is  to attend individual and/or group sessions per assessed needs and concerns.   Plan:   Follow Diabetes Meal Plan as instructed  Eat 3 meals and 2 snacks, every 3-5 hrs  Add lean protein foods to meals/snacks  Monitor glucose levels as instructed by your doctor  Aim for 30 mins of physical activity daily  Bring food record and glucose log to your next nutrition visit  Discuss motivation/drive challenges with therapist.   Expected Outcomes:  Demonstrated interest in learning. Expect positive outcomes  Education material provided: ADA - How to Thrive: A Guide for Your Journey with Diabetes  If problems or questions, patient to contact team via:  Phone and Email  Future DSME appointment: - PRN

## 2020-08-05 NOTE — Patient Instructions (Signed)
Goals:  Follow Diabetes Meal Plan as instructed  Eat 3 meals and 2 snacks, every 3-5 hrs  Add lean protein foods to meals/snacks  Monitor glucose levels as instructed by your doctor  Aim for 30 mins of physical activity daily  Bring food record and glucose log to your next nutrition visit  Discuss motivation/drive challenges with therapist.

## 2020-08-06 ENCOUNTER — Other Ambulatory Visit: Payer: Self-pay

## 2020-08-06 ENCOUNTER — Ambulatory Visit (INDEPENDENT_AMBULATORY_CARE_PROVIDER_SITE_OTHER): Payer: No Typology Code available for payment source | Admitting: Addiction (Substance Use Disorder)

## 2020-08-06 ENCOUNTER — Other Ambulatory Visit: Payer: Self-pay | Admitting: *Deleted

## 2020-08-06 DIAGNOSIS — F0634 Mood disorder due to known physiological condition with mixed features: Secondary | ICD-10-CM

## 2020-08-06 NOTE — Progress Notes (Signed)
      [facility] Counselor/Therapist Progress Note  Patient ID: Sheila James, MRN: [id number],    Date: 08/06/2020  Time Spent: 72mins  Treatment Type: Individual Therapy  Reported Symptoms: depressed, panic attacks  Mental Status Exam:  Appearance:   Casual and Well Groomed     Behavior:  Appropriate and Sharing  Motor:  Normal  Speech/Language:   Clear and Coherent and Normal Rate  Affect:  Appropriate and Congruent  Mood:  anxious, depressed and labile   Thought process:  circumstantial    Thought content:    Obsessions and Rumination  Sensory/Perceptual disturbances:    WNL  Orientation:  x4  Attention:  Good  Concentration:  Fair  Memory:  WNL  Fund of knowledge:   Good  Insight:    Poor  Judgment:   Fair  Impulse Control:  Fair   Risk Assessment: Danger to Self:  No Self-injurious Behavior: No Danger to Others: No Duty to Warn:no Physical Aggression / Violence:No  Access to Firearms a concern: No  Gang Involvement:No   Subjective: Client reported increased depression and panic attacks. Therapist assessed for safety and changes and client denied SI/HI/AVH but did admit to stopping her Latuda & Lamotrigine. Therapist encouraged client to reach out to her doctor asap to resume her medications and to not stop them without discussing with doctors her concerns first. Client struggling with self care and therapist used RPT with client to remind her of coping skills to engage in the use of until stabilized again and to help her ensure healthy care of herself and her family.   Interventions: Motivational Interviewing, Solution-Oriented/Positive Psychology and RPT  Diagnosis:   ICD-10-CM   1. Mood disorder due to known physiological condition with mixed features  F06.34      Plan of Care: Client to return for weekly therapy with Sammuel Cooper, therapist, to review again in 6 months. Client to engage in positive self talk and challenging negative internal  ruminationsand self talk causing client to be overly anxious and worriedusing CBT, on daily practice. Client to engage in mindfulness: ie body scans each eveneing to help process and discharge emotional distress & recognize emotions. Client to utilize BSP (brainspotting) with therapist to help client regulate their anxiety in a somatic- felt body sense way: (ieby working to reducemuscle tension,ruminations,increased heart rate, constant worrying and feeling"hyper" ) by decreasing anxietyby 33% in the next 6 months. Client to prioritize sleep 8+ hours each week night AEB going to bed by 10pm each night.  Barnie Del, LCSW, LCAS, CCTP, CCS-I, BSP

## 2020-08-07 ENCOUNTER — Telehealth: Payer: Self-pay

## 2020-08-07 NOTE — Telephone Encounter (Signed)
Contacted patient this morning after having an apt yesterday with her counselor Lonn Georgia, she had reported to her that she was not taking some of her medication. Patient reports to me she was unable to take the doxepin because it didn't get her to sleep fast enough so she's returned to Trazodone and Melatonin. Patient also reports she's confused on what Janett Billow is wanting her to take, she reports sending over a request for Sertraline which got denied and she though she was to be taking that. She wasn't sure if she was to just to take Lamictal. Confused about her medication and felt it wasn't discussed well enough for her at last visit for her to understand. She also reports she stopped Latuda last Thursday because she feels that it an contributing factor in her elevated blood sugar and cholesterol readings. Informed her I would have Janett Billow call back to go over all her concerns and correct medication.

## 2020-08-07 NOTE — Telephone Encounter (Signed)
Patient was called on 2/4 and offered an earlier opening.

## 2020-08-11 ENCOUNTER — Encounter: Payer: Self-pay | Admitting: Diagnostic Neuroimaging

## 2020-08-11 ENCOUNTER — Ambulatory Visit (INDEPENDENT_AMBULATORY_CARE_PROVIDER_SITE_OTHER): Payer: No Typology Code available for payment source | Admitting: Diagnostic Neuroimaging

## 2020-08-11 VITALS — BP 115/72 | HR 91 | Ht 67.0 in | Wt 235.0 lb

## 2020-08-11 DIAGNOSIS — G43109 Migraine with aura, not intractable, without status migrainosus: Secondary | ICD-10-CM

## 2020-08-11 NOTE — Progress Notes (Signed)
History of migraines since 2000. Tried Aleve. 5-6 headaches in past 2 months. Tried/failed many meds that I cannot remember. One was a triptan that I rarely had to use.

## 2020-08-11 NOTE — Patient Instructions (Signed)
MIGRAINE WITH AURA - continue nabumetone as needed - consider ajovy, aimovig in future - consider nurtec in future

## 2020-08-11 NOTE — Progress Notes (Signed)
[facility]  PATIENT: Sheila James DOB: 04/21/1976  REFERRING CLINICIAN: Leighton Ruff, MD HISTORY FROM: patient  REASON FOR VISIT: new consult    HISTORICAL  CHIEF COMPLAINT:  Chief Complaint  Patient presents with  . Migraine    Rm 6 New Pt "since Jan only had 5-6 headaches, only one lasted 2 days, after I sleep I feel better in am, Ketoralac makes me feel better; Aleve in past"  . Memory Loss    "In Oct, Nov, Dec 2021 I had severe fatigue, headaches, dizziness, nausea; no issues with memory now"    HISTORY OF PRESENT ILLNESS:   45 year old female with migraine headaches since age 2 years old. Patient reports bitemporal and occipital throbbing headaches with nausea, sensitive to light and sound. Sometimes she has visual aura and see spots. Triggering factors include menstrual cycle, lack of sleep and chocolate. She went to headache clinic in the past and has tried topiramate, Effexor and sumatriptan. Currently on nabumetone as needed which works well.  Headaches occur 4-5 times per month. She had a flareup of headaches in October to December 2021, had MRI of the brain which is unremarkable. Headaches have returned to baseline since then. She was having some memory and brain fog with her headaches at that time. The symptoms have resolved.    REVIEW OF SYSTEMS: Full 14 system review of systems performed and negative with exception of: As per HPI.  ALLERGIES: Allergies  Allergen Reactions  . Wellbutrin Xl [Bupropion] Hives    HOME MEDICATIONS: Outpatient Medications Prior to Visit  Medication Sig Dispense Refill  . ACETYLCARNITINE PO Take by mouth.    Marland Kitchen atorvastatin (LIPITOR) 20 MG tablet Take 20 mg by mouth daily.    . Doxepin HCl (SILENOR) 3 MG TABS Take 3-6 mg po QHS 12 tablet 0  . ferrous sulfate 325 (65 FE) MG tablet Take 325 mg by mouth daily with breakfast.    . ketorolac (TORADOL) 10 MG tablet Take 10 mg by mouth every 6 (six)  hours as needed.    . lamoTRIgine (LAMICTAL) 150 MG tablet Take 1 tablet (150 mg total) by mouth daily. 90 tablet 0  . [START ON 08/13/2020] lisdexamfetamine (VYVANSE) 70 MG capsule Take 1 capsule (70 mg total) by mouth daily. 30 capsule 0  . metFORMIN (GLUCOPHAGE-XR) 500 MG 24 hr tablet SMARTSIG:1 Tablet(s) By Mouth Every Evening    . Misc Natural Products (NEURIVA PO) Take by mouth.    . Multiple Minerals-Vitamins (CALCIUM-MAGNESIUM-ZINC-D3 PO) Take by mouth.    . Multiple Vitamin (MULTIVITAMIN) tablet Take 1 tablet by mouth daily.    . nabumetone (RELAFEN) 750 MG tablet Take 750 mg by mouth daily as needed.     . NON FORMULARY "Relief"- Magnesium,Zinc, Rhodola, inositol, berberine, gardenia, banaba extract, et    . norethindrone (MICRONOR) 0.35 MG tablet Take 1 tablet by mouth daily.    . Omega-3 Fatty Acids (OMEGA 3 PO) Take by mouth.    . traZODone (DESYREL) 100 MG tablet Take 1/2-1 tablet po QHS prn insomnia 30 tablet 2  . vitamin E 100 UNIT capsule Take 100 Units by mouth daily.    . B Complex Vitamins (B COMPLEX PO) Take by mouth. (Patient not taking: Reported on 08/11/2020)    . doxepin (SINEQUAN) 10 MG capsule Take 1 capsule (10 mg total) by mouth at bedtime as needed. (Patient not taking: Reported on 08/11/2020) 30 capsule 2  . [START ON 09/10/2020] lisdexamfetamine (VYVANSE) 70 MG capsule Take  1 capsule (70 mg total) by mouth daily. (Patient not taking: Reported on 08/11/2020) 30 capsule 0  . Lurasidone HCl (LATUDA) 60 MG TABS Take 1 tablet (60 mg total) by mouth daily with supper. (Patient not taking: No sig reported) 30 tablet 1  . ondansetron (ZOFRAN) 4 MG tablet Take 4 mg by mouth 3 (three) times daily as needed. (Patient not taking: No sig reported)    . lisdexamfetamine (VYVANSE) 70 MG capsule 70 mg daily.    . naproxen sodium (ALEVE) 220 MG tablet Take 220 mg by mouth daily as needed.     Marland Kitchen OVER THE COUNTER MEDICATION Cyclene     No facility-administered medications prior to visit.     PAST MEDICAL HISTORY: Past Medical History:  Diagnosis Date  . Asthma   . Bursitis   . Diabetes mellitus, type II ([place])   . Elevated cholesterol   . Fatigue   . H/O: depression   . Migraines   . Pica     PAST SURGICAL HISTORY: Past Surgical History:  Procedure Laterality Date  . CESAREAN SECTION    . WISDOM TOOTH EXTRACTION      FAMILY HISTORY: Family History  Problem Relation Age of Onset  . Diabetes Mother   . Anxiety disorder Mother   . Diabetes Father   . Hypertension Father   . Asthma Brother   . ADD / ADHD Brother   . Diabetes Brother   . Anxiety disorder Maternal Aunt   . Anxiety disorder Maternal Aunt   . ADD / ADHD Son     SOCIAL HISTORY: Social History   Socioeconomic History  . Marital status: Divorced    Spouse name: Not on file  . Number of children: 2  . Years of education: Not on file  . Highest education level: Bachelor's degree (e.g., BA, AB, BS)  Occupational History  . Occupation: customer service  Tobacco Use  . Smoking status: Never Smoker  . Smokeless tobacco: Never Used  Substance and Sexual Activity  . Alcohol use: No  . Drug use: Never  . Sexual activity: Not Currently    Partners: Male  Other Topics Concern  . Not on file  Social History Narrative   Lives with children   Caffeine- none   Social Determinants of Health   Financial Resource Strain: Not on file  Food Insecurity: Not on file  Transportation Needs: Not on file  Physical Activity: Not on file  Stress: Not on file  Social Connections: Not on file  Intimate Partner Violence: Not on file     PHYSICAL EXAM  GENERAL EXAM/CONSTITUTIONAL: Vitals:  Vitals:   08/11/20 1316  BP: 115/72  Pulse: 91  Weight: 235 lb (106.6 kg)  Height: 5\' 7"  (1.702 m)     Body mass index is 36.81 kg/m. Wt Readings from Last 3 Encounters:  08/11/20 235 lb (106.6 kg)     Patient is in no distress; well developed, nourished and groomed; neck is  supple  CARDIOVASCULAR:  Examination of carotid arteries is normal; no carotid bruits  Regular rate and rhythm, no murmurs  Examination of peripheral vascular system by observation and palpation is normal  EYES:  Ophthalmoscopic exam of optic discs and posterior segments is normal; no papilledema or hemorrhages  No exam data present  MUSCULOSKELETAL:  Gait, strength, tone, movements noted in Neurologic exam below  NEUROLOGIC: MENTAL STATUS:  No flowsheet data found.  awake, alert, oriented to person, place and time  recent and remote memory  intact  normal attention and concentration  language fluent, comprehension intact, naming intact  fund of knowledge appropriate  CRANIAL NERVE:   2nd - no papilledema on fundoscopic exam  2nd, 3rd, 4th, 6th - pupils equal and reactive to light, visual fields full to confrontation, extraocular muscles intact, no nystagmus  5th - facial sensation symmetric  7th - facial strength symmetric  8th - hearing intact  9th - palate elevates symmetrically, uvula midline  11th - shoulder shrug symmetric  12th - tongue protrusion midline  MOTOR:   normal bulk and tone, full strength in the BUE, BLE  SENSORY:   normal and symmetric to light touch, temperature, vibration  COORDINATION:   finger-nose-finger, fine finger movements normal  REFLEXES:   deep tendon reflexes present and symmetric  GAIT/STATION:   narrow based gait     DIAGNOSTIC DATA (LABS, IMAGING, TESTING) - I reviewed patient records, labs, notes, testing and imaging myself where available.  Lab Results  Component Value Date   WBC 12.8 (H) 05/30/2009   HGB 8.2 DELTA CHECK NOTED (L) 05/30/2009   HCT 24.3 (L) 05/30/2009   MCV 86.3 05/30/2009   PLT 188 05/30/2009   No results found for: NA, K, CL, CO2, GLUCOSE, BUN, CREATININE, CALCIUM, PROT, ALBUMIN, AST, ALT, ALKPHOS, BILITOT, GFRNONAA, GFRAA No results found for: CHOL, HDL, LDLCALC, LDLDIRECT,  TRIG, CHOLHDL No results found for: HGBA1C No results found for: VITAMINB12 No results found for: TSH   06/28/20 MRI brain [I reviewed images myself and agree with interpretation. -VRP]  - Unremarkable appearance of the brain aside from an incidental left frontal developmental venous anomaly.     ASSESSMENT AND PLAN  45 y.o. year old female here with migraine with aura since age 66 years old.  Dx:  1. Migraine with aura and without status migrainosus, not intractable    Meds tried: topiramate, effexor, sumatriptan, nabumetone   PLAN:  MIGRAINE WITH AURA (4-5 per month; doing well and stable) - continue nabumetone as needed - consider ajovy, aimovig in future - consider nurtec in future  Return in about 6 months (around 02/08/2021) for with NP (Amy Lomax).    Penni Bombard, MD 11/04/84, 7:61 PM Certified in Neurology, Neurophysiology and Neuroimaging  [facility] [address] [place], [place] [address] [phone]

## 2020-08-13 ENCOUNTER — Other Ambulatory Visit: Payer: Self-pay

## 2020-08-17 ENCOUNTER — Telehealth (INDEPENDENT_AMBULATORY_CARE_PROVIDER_SITE_OTHER): Payer: No Typology Code available for payment source | Admitting: Endocrinology

## 2020-08-17 ENCOUNTER — Other Ambulatory Visit: Payer: Self-pay

## 2020-08-17 ENCOUNTER — Encounter: Payer: Self-pay | Admitting: Endocrinology

## 2020-08-17 DIAGNOSIS — E119 Type 2 diabetes mellitus without complications: Secondary | ICD-10-CM

## 2020-08-17 MED ORDER — METFORMIN HCL ER 500 MG PO TB24: 1000.0000 mg | ORAL_TABLET | ORAL | 3 refills | Status: AC

## 2020-08-17 NOTE — Patient Instructions (Signed)
good diet and exercise significantly improve the control of your diabetes.  please let me know if you wish to be referred to a dietician.  high blood sugar is very risky to your health.  you should see an eye doctor and dentist every year.  It is very important to get all recommended vaccinations.  Controlling your blood pressure and cholesterol drastically reduces the damage diabetes does to your body.  Those who smoke should quit.  Please discuss these with your doctor.  check your blood sugar once a day.  vary the time of day when you check, between before the 3 meals, and at bedtime.  also check if you have symptoms of your blood sugar being too high or too low.  please keep a record of the readings and bring it to your next appointment here (or you can bring the meter itself).  You can write it on any piece of paper.  please call us sooner if your blood sugar goes below 70, or if most of your readings are over 200.   Please come back for a follow-up appointment in 3 months.    

## 2020-08-17 NOTE — Progress Notes (Signed)
Subjective:    Patient ID: Sheila James, female    DOB: 06-May-1976, 45 y.o.   MRN: [id number]  HPI  telehealth visit today via video visit.  Alternatives to telehealth are presented to this patient, and the patient agrees to the telehealth visit.   Pt is advised of the cost of the visit, and agrees to this, also.   Patient is at home, and I am at home.   Persons attending the telehealth visit: the patient and I pt is referred by Marda Stalker, PA, for diabetes.  Pt states DM was dx'ed in 2021: she is unaware of any chronic complications; she has never been on insulin; pt says her diet and exercise are much better recently; she has never had GDM, pancreatitis, pancreatic surgery, severe hypoglycemia or DKA. She takes metformin 1-2 x 500 mg QD, as rx'ed.  She says cbg's vary from 105-160.  Weight gain has leveled off.  Depression is well-controlled.   Past Medical History:  Diagnosis Date  . Asthma   . Bursitis   . Diabetes mellitus, type II ([place])   . Elevated cholesterol   . Fatigue   . H/O: depression   . Migraines   . Pica     Past Surgical History:  Procedure Laterality Date  . CESAREAN SECTION    . WISDOM TOOTH EXTRACTION      Social History   Socioeconomic History  . Marital status: Divorced    Spouse name: Not on file  . Number of children: 2  . Years of education: Not on file  . Highest education level: Bachelor's degree (e.g., BA, AB, BS)  Occupational History  . Occupation: customer service  Tobacco Use  . Smoking status: Never Smoker  . Smokeless tobacco: Never Used  Substance and Sexual Activity  . Alcohol use: No  . Drug use: Never  . Sexual activity: Not Currently    Partners: Male  Other Topics Concern  . Not on file  Social History Narrative   Lives with children   Caffeine- none   Social Determinants of Health   Financial Resource Strain: Not on file  Food Insecurity: Not on file  Transportation Needs: Not on file  Physical  Activity: Not on file  Stress: Not on file  Social Connections: Not on file  Intimate Partner Violence: Not on file    Current Outpatient Medications on File Prior to Visit  Medication Sig Dispense Refill  . ACETYLCARNITINE PO Take by mouth.    Marland Kitchen atorvastatin (LIPITOR) 20 MG tablet Take 20 mg by mouth daily.    . B Complex Vitamins (B COMPLEX PO) Take by mouth.    . ferrous sulfate 325 (65 FE) MG tablet Take 325 mg by mouth daily with breakfast.    . ketorolac (TORADOL) 10 MG tablet Take 10 mg by mouth every 6 (six) hours as needed.    . lamoTRIgine (LAMICTAL) 150 MG tablet Take 1 tablet (150 mg total) by mouth daily. 90 tablet 0  . [START ON 09/10/2020] lisdexamfetamine (VYVANSE) 70 MG capsule Take 1 capsule (70 mg total) by mouth daily. 30 capsule 0  . lisdexamfetamine (VYVANSE) 70 MG capsule Take 1 capsule (70 mg total) by mouth daily. 30 capsule 0  . Lurasidone HCl (LATUDA) 60 MG TABS Take 1 tablet (60 mg total) by mouth daily with supper. 30 tablet 1  . Misc Natural Products (NEURIVA PO) Take by mouth.    . Multiple Minerals-Vitamins (CALCIUM-MAGNESIUM-ZINC-D3 PO) Take by mouth.    Marland Kitchen  Multiple Vitamin (MULTIVITAMIN) tablet Take 1 tablet by mouth daily.    . nabumetone (RELAFEN) 750 MG tablet Take 750 mg by mouth daily as needed.     . NON FORMULARY "Relief"- Magnesium,Zinc, Rhodola, inositol, berberine, gardenia, banaba extract, et    . norethindrone (MICRONOR) 0.35 MG tablet Take 1 tablet by mouth daily.    . Omega-3 Fatty Acids (OMEGA 3 PO) Take by mouth.    . traZODone (DESYREL) 100 MG tablet Take 1/2-1 tablet po QHS prn insomnia 30 tablet 2  . vitamin E 100 UNIT capsule Take 100 Units by mouth daily.     No current facility-administered medications on file prior to visit.    Allergies  Allergen Reactions  . Wellbutrin Xl [Bupropion] Hives    Family History  Problem Relation Age of Onset  . Diabetes Mother   . Anxiety disorder Mother   . Diabetes Father   . Hypertension  Father   . Asthma Brother   . ADD / ADHD Brother   . Diabetes Brother   . Anxiety disorder Maternal Aunt   . Anxiety disorder Maternal Aunt   . ADD / ADHD Son     Ht 5\' 7"  (1.702 m)   Wt 233 lb (105.7 kg)   BMI 36.49 kg/m   Review of Systems denies blurry vision, chest pain, sob, n/v, urinary frequency.      Objective:   Physical Exam  I have reviewed outside records, and summarized: Pt was noted to have elevated A1c, and referred here.  She was rx'ed metformin. She was also noted to have thyromegaly.       Assessment & Plan:  Type 2 DM: uncontrolled.  We discussed increasing the metformin vs the addition of Januvia.  She chooses to increase metformin instead.    Patient Instructions  good diet and exercise significantly improve the control of your diabetes.  please let me know if you wish to be referred to a dietician.  high blood sugar is very risky to your health.  you should see an eye doctor and dentist every year.  It is very important to get all recommended vaccinations.  Controlling your blood pressure and cholesterol drastically reduces the damage diabetes does to your body.  Those who smoke should quit.  Please discuss these with your doctor.  check your blood sugar once a day.  vary the time of day when you check, between before the 3 meals, and at bedtime.  also check if you have symptoms of your blood sugar being too high or too low.  please keep a record of the readings and bring it to your next appointment here (or you can bring the meter itself).  You can write it on any piece of paper.  please call us sooner if your blood sugar goes below 70, or if most of your readings are over 200.   Please come back for a follow-up appointment in 3 months.

## 2020-08-18 DIAGNOSIS — E119 Type 2 diabetes mellitus without complications: Secondary | ICD-10-CM | POA: Insufficient documentation

## 2020-08-19 ENCOUNTER — Ambulatory Visit: Payer: No Typology Code available for payment source | Admitting: Addiction (Substance Use Disorder)

## 2020-08-21 ENCOUNTER — Other Ambulatory Visit: Payer: Self-pay

## 2020-08-21 ENCOUNTER — Ambulatory Visit (INDEPENDENT_AMBULATORY_CARE_PROVIDER_SITE_OTHER): Payer: No Typology Code available for payment source | Admitting: Addiction (Substance Use Disorder)

## 2020-08-21 DIAGNOSIS — F0634 Mood disorder due to known physiological condition with mixed features: Secondary | ICD-10-CM

## 2020-08-21 NOTE — Progress Notes (Signed)
      [facility] Counselor/Therapist Progress Note  Patient ID: AYLANIE CUBILLOS, MRN: [id number],    Date: 08/21/2020  Time Spent: 20mins  Treatment Type: Individual Therapy  Reported Symptoms: depressed, sad  Mental Status Exam:  Appearance:   Casual and Well Groomed     Behavior:  Appropriate and Sharing  Motor:  Normal  Speech/Language:   Clear and Coherent and Normal Rate  Affect:  Appropriate and Congruent  Mood:  depressed and sad   Thought process:  circumstantial    Thought content:    Obsessions and Rumination  Sensory/Perceptual disturbances:    WNL  Orientation:  x4  Attention:  Good  Concentration:  Fair  Memory:  WNL  Fund of knowledge:   Good  Insight:    Fair  Judgment:   Fair  Impulse Control:  Fair   Risk Assessment: Danger to Self:  No Self-injurious Behavior: No Danger to Others: No Duty to Warn:no Physical Aggression / Violence:No  Access to Firearms a concern: No  Gang Involvement:No   Subjective: Client reported complete fatigue with little improvement in her depression, but reported having stopped her Latuda due to her thought increase of blood sugar due to the medication. Client concerned and therapist encouraged client to discuss medication changes with doctor. Therapist also used MI & RPT with client to validate client's struggle with depression and encourage small goals for her stability. Client processed the daily struggle to have brain fog, no interest in eating/caretaking/etc and her hopelessness. Therapist discussed a few alternative options she could add in addition to her dep medication. Therapist assessed for safety and changes and client denied SI/HI/AVH.  Interventions: Motivational Interviewing and RPT  Diagnosis:   ICD-10-CM   1. Mood disorder due to known physiological condition with mixed features  F06.34     Plan of Care: Client to return for weekly therapy with Sammuel Cooper, therapist, to review again in 6  months. Client to engage in positive self talk and challenging negative internal ruminationsand self talk causing client to be overly anxious and worriedusing CBT, on daily practice. Client to engage in mindfulness: ie body scans each eveneing to help process and discharge emotional distress & recognize emotions. Client to utilize BSP (brainspotting) with therapist to help client regulate their anxiety in a somatic- felt body sense way: (ieby working to reducemuscle tension,ruminations,increased heart rate, constant worrying and feeling"hyper" ) by decreasing anxietyby 33% in the next 6 months. Client to prioritize sleep 8+ hours each week night AEB going to bed by 10pm each night.  Barnie Del, LCSW, LCAS, CCTP, CCS-I, BSP

## 2020-08-25 ENCOUNTER — Other Ambulatory Visit: Payer: Self-pay

## 2020-08-25 ENCOUNTER — Ambulatory Visit (INDEPENDENT_AMBULATORY_CARE_PROVIDER_SITE_OTHER): Payer: No Typology Code available for payment source | Admitting: Psychiatry

## 2020-08-25 ENCOUNTER — Encounter: Payer: Self-pay | Admitting: Psychiatry

## 2020-08-25 DIAGNOSIS — F411 Generalized anxiety disorder: Secondary | ICD-10-CM

## 2020-08-25 DIAGNOSIS — F902 Attention-deficit hyperactivity disorder, combined type: Secondary | ICD-10-CM | POA: Diagnosis not present

## 2020-08-25 DIAGNOSIS — G4700 Insomnia, unspecified: Secondary | ICD-10-CM

## 2020-08-25 DIAGNOSIS — F0634 Mood disorder due to known physiological condition with mixed features: Secondary | ICD-10-CM | POA: Diagnosis not present

## 2020-08-25 MED ORDER — TRAZODONE HCL 100 MG PO TABS
ORAL_TABLET | ORAL | 0 refills | Status: DC
Start: 2020-08-25 — End: 2020-10-07

## 2020-08-25 MED ORDER — LAMOTRIGINE 200 MG PO TABS: 200.0000 mg | ORAL_TABLET | Freq: Every day | ORAL | 1 refills | Status: DC

## 2020-08-25 NOTE — Progress Notes (Signed)
OPHA MCGHEE [id number] 09-07-1975 45 y.o.  Subjective:   Patient ID:  Sheila James is a 45 y.o. (DOB 1975/11/03) female.  Chief Complaint:  Chief Complaint  Patient presents with  . Medication Problem  . Depression  . Follow-up    PMDD, anxiety, insomnia    HPI Sheila James presents to the office today for follow-up of mood disturbance, anxiety, insomnia, and ADHD.   She reports that [place] was causing increased glucose levels that were difficult to control. Reports that she did not have a baseline on glucose and cholesterol prior to starting latuda. She reports that she then stopped [place] in mid-January.   She reports that the combination of Latuda and Trazodone were causing excessive somnolence. She reports that Trazodone alone has been effective for her sleep. She is going to bed earlier to ensure adequate sleep.   She reports that she ran out of [facility] and during those days she felt lethargic. She reports that her energy improved when she got back on ALC.   She reports that she continues to experience low interest and low motivation. Has not been planning and preparing like she normally would. She reports that her mood has been somewhat low at times. She reports that she and her therapist have noticed that she may have seasonal depression. She has been using a light box. Has also started using Alpha-Stim again. She reports, "I don't think I am having a problem with my anxiety at all." Had some anxiety in response to financial stress.  Appetite has improved. She reports making dietary changes with dx of DM and elevated cholesterol. She reports improved concentration with Neuriva. She reports that Vyvanse has been helpful for her energy, focus, and able to be more productive. Reports that she forgot to have labs drawn. Denies SI.   She reports that Progesterone has had some positive effect on her mood. Started progesterone in January.  She reports that next  luteal phase will be around 09/18/20. She reports that her mood gradually worsens about 10 days prior to onset of menses. She reports that PMDD s/s have been less severe compared to the past and has been able to shower and perform some tasks that she typically is unable to do. Reports that she has had PMDD for about 9 years since she stopped breastfeeding her second child.   Has not yet returned to work. Reports that her leave has been extended through 09/18/20.   Has been increasing water intake.   Past Psychiatric Medication Trials: Wellbutrin- allergic reaction.  Sertraline Prozac Buspar Lamictal- Some improvement in depressive s/s outside of pre-menstrual time Latuda- Caused increased glucose and cholesterol levels.  Abilify- helpful for depression.Wt. Gain. Risperdal Geodon Adderall XR Vyvanse Concerta- Ineffective (short duration and minimal improvement) Melatonin- Ineffective Trazodone Doxepin- not helpful for sleep initiation    [place] Office Visit from 08/25/2020 in [place] Visit from 11/13/2019 in [facility] Total Score 0 0    PHQ2-9   Flowsheet Row Nutrition [facility]/16/2020 [facility]  PHQ-2 Total Score 0       Review of Systems:  Review of Systems  Endocrine:       Improved glucose levels  Musculoskeletal: Negative for gait problem.  Neurological: Negative for tremors.  Psychiatric/Behavioral:       Please refer to HPI    Medications: I have reviewed the patient's current medications.  Current Outpatient Medications  Medication Sig Dispense Refill  . ACETYLCARNITINE PO Take by mouth.    . B Complex Vitamins (B COMPLEX PO) Take by mouth.    . ferrous sulfate 325 (65 FE) MG tablet Take 325 mg by mouth daily with breakfast.    . [START ON 09/10/2020] lisdexamfetamine (VYVANSE) 70 MG capsule Take 1 capsule (70 mg total) by mouth daily. 30 capsule 0  . metFORMIN  (GLUCOPHAGE-XR) 500 MG 24 hr tablet Take 2 tablets (1,000 mg total) by mouth every morning. 180 tablet 3  . Misc Natural Products (NEURIVA PO) Take by mouth.    . Multiple Minerals-Vitamins (CALCIUM-MAGNESIUM-ZINC-D3 PO) Take by mouth.    . nabumetone (RELAFEN) 750 MG tablet Take 750 mg by mouth daily as needed.     . NON FORMULARY "Relief"- Magnesium,Zinc, Rhodola, inositol, berberine, gardenia, banaba extract, et    . norethindrone (MICRONOR) 0.35 MG tablet Take 1 tablet by mouth daily.    . Omega-3 Fatty Acids (OMEGA 3 PO) Take by mouth.    . vitamin E 100 UNIT capsule Take 100 Units by mouth daily.    Marland Kitchen atorvastatin (LIPITOR) 20 MG tablet Take 20 mg by mouth daily. (Patient not taking: Reported on 08/25/2020)    . ketorolac (TORADOL) 10 MG tablet Take 10 mg by mouth every 6 (six) hours as needed.    . lamoTRIgine (LAMICTAL) 200 MG tablet Take 1 tablet (200 mg total) by mouth daily. 30 tablet 1  . lisdexamfetamine (VYVANSE) 70 MG capsule Take 1 capsule (70 mg total) by mouth daily. 30 capsule 0  . Multiple Vitamin (MULTIVITAMIN) tablet Take 1 tablet by mouth daily.    . traZODone (DESYREL) 100 MG tablet Take 1/2-1 tablet po QHS prn insomnia 90 tablet 0   No current facility-administered medications for this visit.    Medication Side Effects: Other: Had increased glucose levels with Latuda  Allergies:  Allergies  Allergen Reactions  . Wellbutrin Xl [Bupropion] Hives    Past Medical History:  Diagnosis Date  . Asthma   . Bursitis   . Diabetes mellitus, type II ([place])   . Elevated cholesterol   . Fatigue   . H/O: depression   . Migraines   . Pica     Family History  Problem Relation Age of Onset  . Diabetes Mother   . Anxiety disorder Mother   . Diabetes Father   . Hypertension Father   . Asthma Brother   . ADD / ADHD Brother   . Diabetes Brother   . Anxiety disorder Maternal Aunt   . Anxiety disorder Maternal Aunt   . ADD / ADHD Son     Social History    Socioeconomic History  . Marital status: Divorced    Spouse name: Not on file  . Number of children: 2  . Years of education: Not on file  . Highest education level: Bachelor's degree (e.g., BA, AB, BS)  Occupational History  . Occupation: customer service  Tobacco Use  . Smoking status: Never Smoker  . Smokeless tobacco: Never Used  Substance and Sexual Activity  . Alcohol use: No  . Drug use: Never  . Sexual activity: Not Currently    Partners: Male  Other Topics Concern  . Not on file  Social History Narrative   Lives with children   Caffeine- none   Social Determinants of Health   Financial Resource Strain: Not on file  Food Insecurity: Not on file  Transportation Needs: Not on file  Physical Activity: Not  on file  Stress: Not on file  Social Connections: Not on file  Intimate Partner Violence: Not on file    Past Medical History, Surgical history, Social history, and Family history were reviewed and updated as appropriate.   Please see review of systems for further details on the patient's review from today.   Objective:   Physical Exam:  There were no vitals taken for this visit.  Physical Exam Constitutional:      General: She is not in acute distress. Musculoskeletal:        General: No deformity.  Neurological:     Mental Status: She is alert and oriented to person, place, and time.     Coordination: Coordination normal.  Psychiatric:        Attention and Perception: Attention and perception normal. She does not perceive auditory or visual hallucinations.        Mood and Affect: Mood is not anxious. Affect is not labile, blunt, angry or inappropriate.        Speech: Speech normal.        Behavior: Behavior is cooperative.        Thought Content: Thought content normal. Thought content is not paranoid or delusional. Thought content does not include homicidal or suicidal ideation. Thought content does not include homicidal or suicidal plan.         Cognition and Memory: Cognition and memory normal.        Judgment: Judgment normal.     Comments: Insight intact Mood presents as mildly depressed.       Lab Review:  No results found for: NA, K, CL, CO2, GLUCOSE, BUN, CREATININE, CALCIUM, PROT, ALBUMIN, AST, ALT, ALKPHOS, BILITOT, GFRNONAA, GFRAA     Component Value Date/Time   WBC 12.8 (H) 05/30/2009 0528   RBC 2.82 (L) 05/30/2009 0528   HGB 8.2 DELTA CHECK NOTED (L) 05/30/2009 0528   HCT 24.3 (L) 05/30/2009 0528   PLT 188 05/30/2009 0528   MCV 86.3 05/30/2009 0528   MCHC 33.8 05/30/2009 0528   RDW 14.8 05/30/2009 0528    No results found for: POCLITH, LITHIUM   No results found for: PHENYTOIN, PHENOBARB, VALPROATE, CBMZ   .res Assessment: Plan:   Patient seen for 30 minutes and time spent counseling patient regarding treatment options.  Discussed potential benefits, risks, and side effects of increasing lamotrigine to improve depressive signs and symptoms.  Patient agrees to increase in lamotrigine.  Will increase lamotrigine to 200 mg daily for mood signs and symptoms. Agree with not continuing Latuda since glucose levels became elevated while taking Latuda and have improved since discontinuing Latuda. Continue Vyvanse 70 mg daily for attention deficit disorder. Continue trazodone for insomnia. Recommend continuing psychotherapy with Sammuel Cooper, LCSW. Patient to follow-up in 4 to 6 weeks or sooner if clinically indicated. Patient advised to contact office with any questions, adverse effects, or acute worsening in signs and symptoms.  Recommend extending leave with tentative return to work date of 10/12/20.    Sheila James was seen today for medication problem, depression and follow-up.  Diagnoses and all orders for this visit:  Mood disorder due to known physiological condition with mixed features -     lamoTRIgine (LAMICTAL) 200 MG tablet; Take 1 tablet (200 mg total) by mouth daily.  Insomnia, unspecified type -      traZODone (DESYREL) 100 MG tablet; Take 1/2-1 tablet po QHS prn insomnia  Attention deficit hyperactivity disorder (ADHD), combined type  Generalized anxiety disorder  Please see After Visit Summary for patient specific instructions.  Future Appointments  Date Time Provider [place]  09/02/2020  2:00 PM Barnie Del, [place] [facility] None  09/16/2020  2:00 PM Barnie Del, LCSW [facility] None  09/30/2020  2:00 PM Barnie Del, LCSW [facility] None  10/07/2020 11:30 AM Thayer Headings, PMHNP CP-CP None  10/14/2020  9:00 AM Barnie Del, LCSW [facility] None  10/28/2020  9:00 AM Barnie Del, LCSW [facility] None  11/16/2020  1:30 PM Renato Shin, MD [facility] None  02/08/2021  1:30 PM Lomax, Amy, NP [facility] None    No orders of the defined types were placed in this encounter.   -------------------------------

## 2020-08-25 NOTE — Progress Notes (Signed)
   08/25/20 1403  Facial and Oral Movements  Muscles of Facial Expression 0  Lips and Perioral Area 0  Jaw 0  Tongue 0  Extremity Movements  Upper (arms, wrists, hands, fingers) 0  Lower (legs, knees, ankles, toes) 0  Trunk Movements  Neck, shoulders, hips 0  Overall Severity  Severity of abnormal movements (highest score from questions above) 0  Incapacitation due to abnormal movements 0  Patient's awareness of abnormal movements (rate only patient's report) 0  AIMS Total Score  AIMS Total Score 0

## 2020-08-26 ENCOUNTER — Telehealth: Payer: Self-pay

## 2020-08-26 ENCOUNTER — Telehealth: Payer: Self-pay | Admitting: Psychiatry

## 2020-08-26 NOTE — Telephone Encounter (Signed)
Tried to call pharmacist several times but put on hold and didn't pick up. Will try again to get some clarification.

## 2020-08-26 NOTE — Telephone Encounter (Signed)
Pt needs Rx for Vyvanse. She filled 14 days only due to cost.

## 2020-08-26 NOTE — Telephone Encounter (Signed)
Prior authorization submitted and approved for VYVANSE 70 MG effective 08/26/2020-08/26/2021 PA# [id number] [facility]

## 2020-09-02 ENCOUNTER — Ambulatory Visit (INDEPENDENT_AMBULATORY_CARE_PROVIDER_SITE_OTHER): Payer: No Typology Code available for payment source | Admitting: Addiction (Substance Use Disorder)

## 2020-09-02 ENCOUNTER — Other Ambulatory Visit: Payer: Self-pay

## 2020-09-02 DIAGNOSIS — F0634 Mood disorder due to known physiological condition with mixed features: Secondary | ICD-10-CM

## 2020-09-02 NOTE — Progress Notes (Signed)
      [facility] Counselor/Therapist Progress Note  Patient ID: TAYLOUR LIETZKE, MRN: [id number],    Date: 09/02/2020  Time Spent: 57mins  Treatment Type: Individual Therapy  Reported Symptoms: depressed, very tired.   Mental Status Exam:  Appearance:   Casual and Well Groomed     Behavior:  Appropriate and Sharing  Motor:  Normal & tired/slow talking  Speech/Language:   Clear and Coherent and Normal Rate  Affect:  Appropriate and Congruent  Mood:  depressed   Thought process:  normal    Thought content:    Obsessions and Rumination  Sensory/Perceptual disturbances:    WNL  Orientation:  x4  Attention:  Good  Concentration:  Fair  Memory:  WNL  Fund of knowledge:   Good  Insight:    Good  Judgment:   Fair  Impulse Control:  Fair   Risk Assessment: Danger to Self:  No Self-injurious Behavior: No Danger to Others: No Duty to Warn:no Physical Aggression / Violence:No  Access to Firearms a concern: No  Gang Involvement:No   Subjective: Client reported increased fatigue but looking for relief soon as she just increased her dose. Client reported increased frequency of headaches and low motivation. Client reported sleeping more and still feeling exhausted and low motivation. Client reported having increased her Lamotrigine to help her with her depression/ low interest. Client processed a lucid sleep experience that scared her a little and made her question what could be causing it. Client reported increased issues with falling asleep while doing things like cooking and talking to her sons. Therapist used MI & RPT with client to validate client's frustration and struggle with fatigue and help her consider things she could do to cope with the fatigue.Therapist assessed for safety and changes and client denied SI/HI/AVH.  Interventions: Motivational Interviewing and RPT  Diagnosis:   ICD-10-CM   1. Mood disorder due to known physiological condition with mixed features   F06.34     Plan of Care: Client to return for weekly therapy with Sammuel Cooper, therapist, to review again in 6 months. Client to engage in positive self talk and challenging negative internal ruminationsand self talk causing client to be overly anxious and worriedusing CBT, on daily practice. Client to engage in mindfulness: ie body scans each eveneing to help process and discharge emotional distress & recognize emotions. Client to utilize BSP (brainspotting) with therapist to help client regulate their anxiety in a somatic- felt body sense way: (ieby working to reducemuscle tension,ruminations,increased heart rate, constant worrying and feeling"hyper" ) by decreasing anxietyby 33% in the next 6 months. Client to prioritize sleep 8+ hours each week night AEB going to bed by 10pm each night.  Barnie Del, LCSW, LCAS, CCTP, CCS-I, BSP

## 2020-09-07 ENCOUNTER — Telehealth: Payer: Self-pay | Admitting: Psychiatry

## 2020-09-07 ENCOUNTER — Other Ambulatory Visit: Payer: Self-pay | Admitting: Psychiatry

## 2020-09-07 DIAGNOSIS — F902 Attention-deficit hyperactivity disorder, combined type: Secondary | ICD-10-CM

## 2020-09-07 MED ORDER — LISDEXAMFETAMINE DIMESYLATE 70 MG PO CAPS: 70.0000 mg | ORAL_CAPSULE | Freq: Every day | ORAL | 0 refills | Status: DC

## 2020-09-07 NOTE — Telephone Encounter (Signed)
Rumaysa called because when she picked up her Vvyanse she was only able to #14 tablets because it needed a PA so she just got enough to give her some medication until the PA was complete.  Now she needs to remaining #16 tablets for this months supply.  Please send a prescription for the remaining #16 to [facility] on [address], [place], [place]

## 2020-09-07 NOTE — Telephone Encounter (Signed)
I sent this in for Jessica's pt.  Is she here today?

## 2020-09-16 ENCOUNTER — Other Ambulatory Visit: Payer: Self-pay

## 2020-09-16 ENCOUNTER — Ambulatory Visit (INDEPENDENT_AMBULATORY_CARE_PROVIDER_SITE_OTHER): Payer: No Typology Code available for payment source | Admitting: Addiction (Substance Use Disorder)

## 2020-09-16 DIAGNOSIS — F0634 Mood disorder due to known physiological condition with mixed features: Secondary | ICD-10-CM | POA: Diagnosis not present

## 2020-09-16 LAB — VITAMIN D 25 HYDROXY (VIT D DEFICIENCY, FRACTURES): Vit D, 25-Hydroxy: 27.7 ng/mL — ABNORMAL LOW (ref 30.0–100.0)

## 2020-09-16 LAB — AMMONIA: Ammonia: 25 ug/dL — ABNORMAL LOW (ref 31–155)
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
## 2020-09-23 ENCOUNTER — Telehealth: Payer: Self-pay | Admitting: Psychiatry

## 2020-09-23 NOTE — Telephone Encounter (Signed)
Disability and [facility] Provider Statement form received. Given to [facility] 3/23

## 2020-09-30 ENCOUNTER — Ambulatory Visit: Payer: No Typology Code available for payment source | Admitting: Addiction (Substance Use Disorder)

## 2020-10-07 ENCOUNTER — Encounter: Payer: Self-pay | Admitting: Psychiatry

## 2020-10-07 ENCOUNTER — Other Ambulatory Visit: Payer: Self-pay

## 2020-10-07 ENCOUNTER — Ambulatory Visit (INDEPENDENT_AMBULATORY_CARE_PROVIDER_SITE_OTHER): Payer: No Typology Code available for payment source | Admitting: Psychiatry

## 2020-10-07 DIAGNOSIS — G4700 Insomnia, unspecified: Secondary | ICD-10-CM

## 2020-10-07 DIAGNOSIS — F0634 Mood disorder due to known physiological condition with mixed features: Secondary | ICD-10-CM

## 2020-10-07 DIAGNOSIS — F902 Attention-deficit hyperactivity disorder, combined type: Secondary | ICD-10-CM

## 2020-10-07 MED ORDER — LISDEXAMFETAMINE DIMESYLATE 70 MG PO CAPS: 70.0000 mg | ORAL_CAPSULE | Freq: Every day | ORAL | 0 refills | Status: DC

## 2020-10-07 MED ORDER — LAMOTRIGINE 200 MG PO TABS: 200.0000 mg | ORAL_TABLET | Freq: Every day | ORAL | 0 refills | Status: DC

## 2020-10-07 MED ORDER — TRAZODONE HCL 100 MG PO TABS: ORAL_TABLET | ORAL | 0 refills | Status: DC

## 2020-10-07 NOTE — Progress Notes (Signed)
Sheila James [id number] 05-27-1976 45 y.o.  Subjective:   Patient ID:  Sheila James is a 45 y.o. (DOB 1976/01/09) female.  Chief Complaint:  Chief Complaint  Patient presents with  . Follow-up    Anxiety, mood disturbance, ADHD, and sleep disturbance    HPI Bristol L Laster-Woods presents to the office today for follow-up of follow-up of mood disturbance, anxiety, ADHD, and sleep disturbance. She reports that she is feeling better overall and has improved awareness and management of glucose levels. She reports that increase in Lamotrigine may have been helpful for her mood.   She reports "I barely moved" the week of her birthday. She reports that pharmacy was delayed with filling Vyvnase and her energy was lower. She reports that week her food choices were "not as great." She has been getting exercise regularly the last couple of weeks and is hopeful that this will be helpful for her mood. She reports that her mood has been ok around her menstrual cycle. Energy and motivation are currently good. She reports that she has had some difficulty with concentration. She reports that she has occasionally been forgetting what she is supposed to be doing or what she just read. She notices her concentration is not as good when she has not taken Neuriva. Was able to focus on multiple tasks yesterday morning. Denies impulsivity or risky behavior. She reports that she has been more intentional about getting adequate sleep. She reports that she is now getting 8-9 hours on the weekends. During the week she has had some difficulty falling asleep. Has set a bedtime of 9:30 pm. Denies SI.   She reports that her mood has been "ok." She reports that she had some anxiety in response to possible issue with living situation. She reports that her stress has been less with knowing now that she will be moving and now recognizes that living situation has been causing her significant stress. She reports that  she now recognizes she was having panic attacks in December and January. She reports, "I feel like something has been lifted off of me."   She has enlisted the help of her cousin to help with accountability with health goals.   Moving this Friday.   Past Psychiatric Medication Trials: Wellbutrin- allergic reaction.  Sertraline Prozac Buspar Lamictal- Some improvement in depressive s/s outside of pre-menstrual time Latuda- Caused increased glucose and cholesterol levels. Abilify- helpful for depression.Wt. Gain. Risperdal Geodon Adderall XR Vyvanse Concerta- Ineffective (short duration and minimal improvement) Melatonin- Ineffective Trazodone Doxepin- not helpful for sleep initiation   [place] Office Visit from 08/25/2020 in [place] Visit from 11/13/2019 in [place] Total Score 0 0    PHQ2-9   Flowsheet Row Nutrition from 07/16/2020 in [facility]  PHQ-2 Total Score 0       Review of Systems:  Review of Systems  Musculoskeletal: Negative for gait problem.  Neurological: Negative for tremors.       Had some headaches in March  Psychiatric/Behavioral:       Please refer to HPI    She reports that her employer offers support program for DM to include continuous glucose monitoring, a free Fit Bit, and a coach.   Medications: I have reviewed the patient's current medications.  Current Outpatient Medications  Medication Sig Dispense Refill  . [START ON 12/24/2020] lisdexamfetamine (VYVANSE) 70 MG capsule Take 1 capsule (70 mg total) by mouth daily. [place]  capsule 0  . Misc Natural Products (NEURIVA PO) Take by mouth.    . norethindrone (MICRONOR) 0.35 MG tablet Take 1 tablet by mouth daily.    . ACETYLCARNITINE PO Take by mouth.    Marland Kitchen atorvastatin (LIPITOR) 20 MG tablet Take 20 mg by mouth daily. (Patient not taking: Reported on 08/25/2020)    . ferrous sulfate 325 (65 FE) MG  tablet Take 325 mg by mouth daily with breakfast. (Patient not taking: Reported on 10/07/2020)    . ketorolac (TORADOL) 10 MG tablet Take 10 mg by mouth every 6 (six) hours as needed.    . lamoTRIgine (LAMICTAL) 200 MG tablet Take 1 tablet (200 mg total) by mouth daily. 90 tablet 0  . [START ON 11/26/2020] lisdexamfetamine (VYVANSE) 70 MG capsule Take 1 capsule (70 mg total) by mouth daily. 30 capsule 0  . [START ON 10/29/2020] lisdexamfetamine (VYVANSE) 70 MG capsule Take 1 capsule (70 mg total) by mouth daily. 30 capsule 0  . metFORMIN (GLUCOPHAGE-XR) 500 MG 24 hr tablet Take 2 tablets (1,000 mg total) by mouth every morning. 180 tablet 3  . Multiple Minerals-Vitamins (CALCIUM-MAGNESIUM-ZINC-D3 PO) Take by mouth.    . Multiple Vitamin (MULTIVITAMIN) tablet Take 1 tablet by mouth daily.    . nabumetone (RELAFEN) 750 MG tablet Take 750 mg by mouth daily as needed.     . NON FORMULARY "Relief"- Magnesium,Zinc, Rhodola, inositol, berberine, gardenia, banaba extract, et    . Omega-3 Fatty Acids (OMEGA 3 PO) Take by mouth.    . traZODone (DESYREL) 100 MG tablet Take 1/2-1 tablet po QHS prn insomnia 90 tablet 0  . vitamin E 100 UNIT capsule Take 100 Units by mouth daily.     No current facility-administered medications for this visit.    Medication Side Effects: None  Allergies:  Allergies  Allergen Reactions  . Wellbutrin Xl [Bupropion] Hives    Past Medical History:  Diagnosis Date  . Asthma   . Bursitis   . Diabetes mellitus, type II ([place])   . Elevated cholesterol   . Fatigue   . H/O: depression   . Migraines   . Pica     Family History  Problem Relation Age of Onset  . Diabetes Mother   . Anxiety disorder Mother   . Diabetes Father   . Hypertension Father   . Asthma Brother   . ADD / ADHD Brother   . Diabetes Brother   . Anxiety disorder Maternal Aunt   . Anxiety disorder Maternal Aunt   . ADD / ADHD Son     Social History   Socioeconomic History  . Marital status:  Divorced    Spouse name: Not on file  . Number of children: 2  . Years of education: Not on file  . Highest education level: Bachelor's degree (e.g., BA, AB, BS)  Occupational History  . Occupation: customer service  Tobacco Use  . Smoking status: Never Smoker  . Smokeless tobacco: Never Used  Substance and Sexual Activity  . Alcohol use: No  . Drug use: Never  . Sexual activity: Not Currently    Partners: Male  Other Topics Concern  . Not on file  Social History Narrative   Lives with children   Caffeine- none   Social Determinants of Health   Financial Resource Strain: Not on file  Food Insecurity: Not on file  Transportation Needs: Not on file  Physical Activity: Not on file  Stress: Not on file  Social Connections: Not on  file  Intimate Partner Violence: Not on file    Past Medical History, Surgical history, Social history, and Family history were reviewed and updated as appropriate.   Please see review of systems for further details on the patient's review from today.   Objective:   Physical Exam:  BP (!) 120/93   Pulse 82   Physical Exam Constitutional:      General: She is not in acute distress. Musculoskeletal:        General: No deformity.  Neurological:     Mental Status: She is alert and oriented to person, place, and time.     Coordination: Coordination normal.  Psychiatric:        Attention and Perception: Attention and perception normal. She does not perceive auditory or visual hallucinations.        Mood and Affect: Mood normal. Mood is not anxious or depressed. Affect is not labile, blunt, angry or inappropriate.        Speech: Speech normal.        Behavior: Behavior normal.        Thought Content: Thought content normal. Thought content is not paranoid or delusional. Thought content does not include homicidal or suicidal ideation. Thought content does not include homicidal or suicidal plan.        Cognition and Memory: Cognition and memory  normal.        Judgment: Judgment normal.     Comments: Insight intact     Lab Review:  No results found for: NA, K, CL, CO2, GLUCOSE, BUN, CREATININE, CALCIUM, PROT, ALBUMIN, AST, ALT, ALKPHOS, BILITOT, GFRNONAA, GFRAA     Component Value Date/Time   WBC 12.8 (H) 05/30/2009 0528   RBC 2.82 (L) 05/30/2009 0528   HGB 8.2 DELTA CHECK NOTED (L) 05/30/2009 0528   HCT 24.3 (L) 05/30/2009 0528   PLT 188 05/30/2009 0528   MCV 86.3 05/30/2009 0528   MCHC 33.8 05/30/2009 0528   RDW 14.8 05/30/2009 0528    No results found for: POCLITH, LITHIUM   No results found for: PHENYTOIN, PHENOBARB, VALPROATE, CBMZ   .res Assessment: Plan:   Will continue current plan of care since target signs and symptoms are well controlled without any tolerability issues. Continue Lamictal 200 mg po qd for mood stabilization.  Continue Vyvanse 70 mg daily for ADHD.  Continue Trazodone 100 mg 1/2-1 tablet at bedtime as needed for insomnia.  She is able to return to work on 10/26/20. Recommend she be able to work remotely from home.  Recommend continuing therapy with Sammuel Cooper, LCSW. Pt to follow-up in 3 months or sooner if clinically indicated.  Patient advised to contact office with any questions, adverse effects, or acute worsening in signs and symptoms.  Trinty was seen today for follow-up.  Diagnoses and all orders for this visit:  Mood disorder due to known physiological condition with mixed features -     lamoTRIgine (LAMICTAL) 200 MG tablet; Take 1 tablet (200 mg total) by mouth daily.  Attention deficit hyperactivity disorder (ADHD), combined type -     lisdexamfetamine (VYVANSE) 70 MG capsule; Take 1 capsule (70 mg total) by mouth daily. -     lisdexamfetamine (VYVANSE) 70 MG capsule; Take 1 capsule (70 mg total) by mouth daily. -     lisdexamfetamine (VYVANSE) 70 MG capsule; Take 1 capsule (70 mg total) by mouth daily.  Insomnia, unspecified type -     traZODone (DESYREL) 100 MG tablet; Take  1/2-1 tablet po QHS prn  insomnia     Please see After Visit Summary for patient specific instructions.  Future Appointments  Date Time Provider [place]  10/14/2020  9:00 AM Barnie Del, LCSW [facility] None  10/28/2020  9:00 AM Barnie Del, LCSW [facility] None  11/10/2020  9:00 AM Barnie Del, LCSW [facility] None  11/16/2020  1:30 PM Renato Shin, MD [facility] None  11/25/2020  2:00 PM Barnie Del, LCSW [facility] None  12/07/2020  8:30 AM Thayer Headings, PMHNP [facility] None  12/09/2020  2:00 PM Barnie Del, LCSW [facility] None  12/23/2020  2:00 PM Barnie Del, LCSW [facility] None  02/08/2021  1:30 PM Lomax, Amy, NP [facility] None    No orders of the defined types were placed in this encounter.   -------------------------------

## 2020-10-14 ENCOUNTER — Ambulatory Visit: Payer: No Typology Code available for payment source | Admitting: Addiction (Substance Use Disorder)

## 2020-10-15 DIAGNOSIS — Z0289 Encounter for other administrative examinations: Secondary | ICD-10-CM

## 2020-10-19 ENCOUNTER — Telehealth: Payer: Self-pay | Admitting: Psychiatry

## 2020-10-19 NOTE — Telephone Encounter (Signed)
Continuation to 10/19/20 at 11:35 am note. The note written stated she RTW on 10/26/20. She returned to work a week earlier, on 10/19/20. Also, she would like for the letter to state that she can be allowed to work from home. Please fax to [employer], claim # is [id number]. Their fax number is [phone]. Release should be on file.

## 2020-10-19 NOTE — Telephone Encounter (Signed)
Delitha called and said that she needs the RTW letter amended. She said she returned to work on 10/19/20 but note said she RTW on

## 2020-10-19 NOTE — Telephone Encounter (Signed)
Please review

## 2020-10-21 NOTE — Telephone Encounter (Signed)
Sheila James, did you type up a RTW letter?

## 2020-10-22 NOTE — Telephone Encounter (Signed)
Reviewed and discussed

## 2020-10-28 ENCOUNTER — Ambulatory Visit (INDEPENDENT_AMBULATORY_CARE_PROVIDER_SITE_OTHER): Payer: No Typology Code available for payment source | Admitting: Addiction (Substance Use Disorder)

## 2020-10-28 DIAGNOSIS — F0634 Mood disorder due to known physiological condition with mixed features: Secondary | ICD-10-CM | POA: Diagnosis not present

## 2020-10-28 NOTE — Progress Notes (Signed)
[facility] Counselor/Therapist Progress Note  Patient ID: Sheila James, MRN: [id number],    Date: 10/28/2020  Time Spent: 31mins  Treatment Type: Individual Therapy  Reported Symptoms: settling in but tired.   Mental Status Exam:  Appearance:   Casual and Well Groomed     Behavior:  Appropriate and Sharing  Motor:  Normal   Speech/Language:   Clear and Coherent and Normal Rate  Affect:  Appropriate and Congruent  Mood:  normal and relieved   Thought process:  normal    Thought content:    Obsessions and Rumination  Sensory/Perceptual disturbances:    WNL  Orientation:  x4  Attention:  Good  Concentration:  Fair  Memory:  WNL  Fund of knowledge:   Good  Insight:    Good  Judgment:   Fair  Impulse Control:  Fair   Risk Assessment: Danger to Self:  No Self-injurious Behavior: No Danger to Others: No Duty to Warn:no Physical Aggression / Violence:No  Access to Firearms a concern: No  Gang Involvement:No   Virtual Visit via VIDEO: I connected with client by MyChart video enabled telemedicine/telehealth application, with their informed consent, and verified client privacy and that I am speaking with the correct person using two identifiers. I discussed the limitations, risks, security and privacy concerns of performing psychotherapy and management service virtually and confirmed their location. I also discussed with the patient that there may be a patient responsible charge related to this service and to confirm with the front desk if their insurance covers teletherapy. I also discussed with the patient the availability of in person appointments. The patient expressed understanding and agreed to proceed. I discussed the treatment planning with the client. The client was provided an opportunity to ask questions and all were answered. The client agreed with the plan and demonstrated an understanding of the instructions. The client was advised to call our office if  symptoms worsen or feel they are in a crisis state and need immediate contact. Client also reminded of a crisis line number and to use 9-1-1 if there's an emergency.  Therapist Location: office; Client Location: home.  Subjective: Client reported settling in but being tired after working so hard to get a place to rent/ to stay that's consistent following years of homelessness after getting behind on rent and getting evicted, making it near impossible to get another rental location to live. Client processed the childhood trauma that comes up that caused her to feel so alone in this life and how she feels outed from her foster family during these last few years when struggling with having a place for her and her two boys to live. Therapist used MI & grief therapy to support client as she processed the past pain and helping her to settle into the space she is now. Therapist used Mindfulness to help client ground emotionally and begin to see herself as safe, as a way to settle her nervous system and try to bring her out of a survival state. Therapist assessed for safety and changes and client denied SI/HI/AVH.  Interventions: Motivational Interviewing, Grief Therapy and RPT  Diagnosis:   ICD-10-CM   1. Mood disorder due to known physiological condition with mixed features  F06.34     Plan of Care: Client to return for weekly therapy with Sammuel Cooper, therapist, to review again in 6 months. Client to engage in positive self talk and challenging negative internal ruminationsand self talk causing client to be overly  anxious and worriedusing CBT, on daily practice. Client to engage in mindfulness: ie body scans each eveneing to help process and discharge emotional distress & recognize emotions. Client to utilize BSP (brainspotting) with therapist to help client regulate their anxiety in a somatic- felt body sense way: (ieby working to reducemuscle tension,ruminations,increased heart rate, constant  worrying and feeling"hyper" ) by decreasing anxietyby 33% in the next 6 months. Client to prioritize sleep 8+ hours each week night AEB going to bed by 10pm each night.  Barnie Del, LCSW, LCAS, CCTP, CCS-I, BSP

## 2020-11-10 ENCOUNTER — Ambulatory Visit: Payer: No Typology Code available for payment source | Admitting: Addiction (Substance Use Disorder)

## 2020-11-16 ENCOUNTER — Ambulatory Visit: Payer: No Typology Code available for payment source | Admitting: Endocrinology

## 2020-11-25 ENCOUNTER — Ambulatory Visit: Payer: No Typology Code available for payment source | Admitting: Addiction (Substance Use Disorder)

## 2020-12-07 ENCOUNTER — Other Ambulatory Visit: Payer: Self-pay

## 2020-12-07 ENCOUNTER — Ambulatory Visit: Payer: No Typology Code available for payment source | Admitting: Psychiatry

## 2020-12-07 ENCOUNTER — Telehealth: Payer: Self-pay | Admitting: Psychiatry

## 2020-12-07 DIAGNOSIS — F902 Attention-deficit hyperactivity disorder, combined type: Secondary | ICD-10-CM

## 2020-12-07 NOTE — Telephone Encounter (Signed)
Pt would like a refill on Vyvanse 70mg . Please send to [facility] [address]. Pt missed appt today but left message over weekend.

## 2020-12-07 NOTE — Telephone Encounter (Signed)
pended

## 2020-12-08 MED ORDER — LISDEXAMFETAMINE DIMESYLATE 70 MG PO CAPS: 70.0000 mg | ORAL_CAPSULE | Freq: Every day | ORAL | 0 refills | Status: DC

## 2020-12-09 ENCOUNTER — Ambulatory Visit: Payer: No Typology Code available for payment source | Admitting: Addiction (Substance Use Disorder)

## 2020-12-18 ENCOUNTER — Telehealth: Payer: Self-pay | Admitting: Psychiatry

## 2020-12-18 NOTE — Telephone Encounter (Signed)
Rtc to pt and she reports Trazodone 100 mg is not helping her like it was. She reports taking it then waking up again in 2 hours, then takes another 4 hours to get to sleep which is about an hour before she gets up for work. With Janett Billow being out, advised her to increase her Trazodone another 1/2-1 tablet to see if effective. She agreed. Informed her we would follow up on Monday.

## 2020-12-18 NOTE — Telephone Encounter (Signed)
Sheila James called to report that her Trazodone is no longer working.  She is waking up after 2 hrs. Of sleep.  Then it takes her 4 hrs to go back to sleep.  She needs something stronger or something different so she can get some sleep. Appt 7/25.  [facility] [address], [address].  Please call to discuss.

## 2020-12-21 NOTE — Telephone Encounter (Signed)
LVM for pt to return call

## 2020-12-23 ENCOUNTER — Other Ambulatory Visit: Payer: Self-pay

## 2020-12-23 ENCOUNTER — Ambulatory Visit: Payer: No Typology Code available for payment source | Admitting: Addiction (Substance Use Disorder)

## 2020-12-23 DIAGNOSIS — F0634 Mood disorder due to known physiological condition with mixed features: Secondary | ICD-10-CM | POA: Diagnosis not present

## 2020-12-23 NOTE — Progress Notes (Signed)
[facility] Counselor/Therapist Progress Note  Patient ID: Sheila James, MRN: [id number],    Date: 12/23/2020  Time Spent: 104mins  Treatment Type: Individual Therapy  Reported Symptoms: worn out.  Mental Status Exam:  Appearance:   Casual and Well Groomed     Behavior:  Appropriate and Sharing  Motor:  Normal   Speech/Language:   Clear and Coherent and Normal Rate  Affect:  Appropriate and Congruent  Mood:  anxious   Thought process:  normal    Thought content:    Obsessions and Rumination  Sensory/Perceptual disturbances:    WNL  Orientation:  x4  Attention:  Good  Concentration:  Fair  Memory:  WNL  Fund of knowledge:   Good  Insight:    Good  Judgment:   Fair  Impulse Control:  Fair   Risk Assessment: Danger to Self:  No Self-injurious Behavior: No Danger to Others: No Duty to Warn:no Physical Aggression / Violence:No  Access to Firearms a concern: No  Gang Involvement:No   Virtual Visit via VIDEO: I connected with client by MyChart video enabled telemedicine/telehealth application, with their informed consent, and verified client privacy and that I am speaking with the correct person using two identifiers. I discussed the limitations, risks, security and privacy concerns of performing psychotherapy and management service virtually and confirmed their location. I also discussed with the patient that there may be a patient responsible charge related to this service and to confirm with the front desk if their insurance covers teletherapy. I also discussed with the patient the availability of in person appointments. The patient expressed understanding and agreed to proceed. I discussed the treatment planning with the client. The client was provided an opportunity to ask questions and all were answered. The client agreed with the plan and demonstrated an understanding of the instructions. The client was advised to call our office if symptoms worsen or feel  they are in a crisis state and need immediate contact. Client also reminded of a crisis line number and to use 9-1-1 if there's an emergency.  Therapist Location: office; Client Location: home.  Subjective: Client reported feeling worn out and like she couldn't keep juggling life's responsibilities in the state she was last month. Client processed being super stressed after getting sick 3 weeks and having $700-800 less than expected this week, making it hard to pay her rent. Client shared she had financial help from her bf to assist her that she is thankful for. Client reported tearfully, the helplessness she feels when she cant pay the bills she needs. Therapist used MI to validate client's concerns and support her in seeking ideas for her obstacles and find hopeful thoughts to focus on. Client described her other physical issues causing her more stress and reducing her income. Therapist used SFT with client to help her plan steps to reach her goals of increasing her income. Therapist assessed for safety and changes and client denied SI/HI/AVH.  Interventions: Motivational Interviewing, Solution-Oriented/Positive Psychology, and RPT  Diagnosis:   ICD-10-CM   1. Mood disorder due to known physiological condition with mixed features  F06.34        Plan of Care:  Client to return for weekly therapy with Sammuel Cooper, therapist, to review again in 6 months.  Client to engage in positive self talk and challenging negative internal ruminations and self talk causing client to be overly anxious and worried using CBT, on daily practice. Client to engage in mindfulness: ie body scans  each eveneing to help process and discharge emotional distress & recognize emotions. Client to utilize BSP (brainspotting) with therapist to help client regulate their anxiety in a somatic- felt body sense way: (ie by working to reduce muscle tension, ruminations, increased heart rate, constant worrying and feeling "hyper" ) by  decreasing anxiety by 33% in the next 6 months.  Client to prioritize sleep 8+ hours each week night AEB going to bed by 10pm each night.    Barnie Del, LCSW, LCAS, CCTP, CCS-I, BSP

## 2021-01-18 ENCOUNTER — Telehealth: Payer: Self-pay | Admitting: Psychiatry

## 2021-01-18 DIAGNOSIS — G4700 Insomnia, unspecified: Secondary | ICD-10-CM

## 2021-01-18 MED ORDER — TRAZODONE HCL 100 MG PO TABS: ORAL_TABLET | ORAL | 0 refills | Status: DC

## 2021-01-18 NOTE — Telephone Encounter (Signed)
Script sent  

## 2021-01-18 NOTE — Telephone Encounter (Signed)
Patient lm requesting a refill on the Trazodone. Patient was advised to increase due to issues describe in telephone encounter on 6/17. Due to this increase she is currently out. Contact patient when the medication is refilled per her request. # [phone].

## 2021-01-18 NOTE — Telephone Encounter (Signed)
Please let me know what dose needs to be sent

## 2021-01-25 ENCOUNTER — Encounter: Payer: Self-pay | Admitting: Psychiatry

## 2021-01-25 ENCOUNTER — Telehealth (INDEPENDENT_AMBULATORY_CARE_PROVIDER_SITE_OTHER): Payer: No Typology Code available for payment source | Admitting: Psychiatry

## 2021-01-25 DIAGNOSIS — G4700 Insomnia, unspecified: Secondary | ICD-10-CM | POA: Diagnosis not present

## 2021-01-25 DIAGNOSIS — F0634 Mood disorder due to known physiological condition with mixed features: Secondary | ICD-10-CM | POA: Diagnosis not present

## 2021-01-25 DIAGNOSIS — F902 Attention-deficit hyperactivity disorder, combined type: Secondary | ICD-10-CM | POA: Diagnosis not present

## 2021-01-25 MED ORDER — LAMOTRIGINE 200 MG PO TABS: 300.0000 mg | ORAL_TABLET | Freq: Every day | ORAL | 0 refills | Status: DC

## 2021-01-25 MED ORDER — LISDEXAMFETAMINE DIMESYLATE 70 MG PO CAPS: 70.0000 mg | ORAL_CAPSULE | Freq: Every day | ORAL | 0 refills | Status: DC

## 2021-01-25 MED ORDER — MODAFINIL 200 MG PO TABS: ORAL_TABLET | ORAL | 0 refills | Status: DC

## 2021-01-25 MED ORDER — TRAZODONE HCL 100 MG PO TABS: 200.0000 mg | ORAL_TABLET | Freq: Every day | ORAL | 0 refills | Status: DC

## 2021-01-25 NOTE — Progress Notes (Signed)
Sheila James:[id number] 1976-06-26 45 y.o.  Virtual Visit via Video Note  I connected with pt @ on 01/25/21 at  9:30 AM EDT by a video enabled telemedicine application and verified that I am speaking with the correct person using two identifiers.   I discussed the limitations of evaluation and management by telemedicine and the availability of in person appointments. The patient expressed understanding and agreed to proceed.  I discussed the assessment and treatment plan with the patient. The patient was provided an opportunity to ask questions and all were answered. The patient agreed with the plan and demonstrated an understanding of the instructions.   The patient was advised to call back or seek an in-person evaluation if the symptoms worsen or if the condition fails to improve as anticipated.  I provided 30 minutes of non-face-to-face time during this encounter.  The patient was located at home.  The provider was located at [place].   Thayer Headings, PMHNP   Subjective:   Patient ID:  Sheila James is a 45 y.o. (DOB December 11, 1975) female.  Chief Complaint:  Chief Complaint  Patient presents with   Other    PMDD   Follow-up    H/o anxiety, mood disturbance, and ADHD     HPI [name] presents for follow-up of mood disturbance, anxiety, ADHD, and sleep disturbance. She reports "I have a hard time in May and June." She had an illness that coincided with her luteal phase and had no energy. She reports that she has felt better this month.  "I cannot sleep without Trazodone...and I have to take 200 mg." She reports adequate sleep with Trazodone.   She notices low interest and low motivation occur during her luteal phase. She reports that she will feel extremely tired and have difficulty staying awake. She reports that there were periods in June and July where she was not able to get dressed or get out of bed and worked from bed in her  [place]. She reports that her motivation and interest are good the remainder of the time and her sons notice when this occurs. She reports that her mood has been good "I'm pretty mellow... I don't argue." Denies sad mood. Denies elevated moods or impulsivity. She reports that she has been making lists and planning her purchases. She reports that her concentration is adequate with Vyvanse. She notices distractibility if she has not taken Vyvanse. She reports that her concentration has improved. She reports that she has certain routines. Appetite is good. She reports that she eats excessively if she does not  take Vyvanse. Denies SI.   She reports that she has had moments of anxiety and needed to use alpha-stim. She reports that on one occasion she was "so worried, I couldn't work and I didn't even know what I was worried about." She reports that she had some anxiety for about 4-5 days around that same time. Notices her anxiety will "spike" around luteal phase.   She reports that she has had changes in timing of her menstrual cycle.   She reports that she will fill up pill box for 4 weeks at a time. She reports that she occasionally will miss a few doses.    Past Psychiatric Medication Trials: Wellbutrin- allergic reaction. Sertraline Prozac Buspar Lamictal- Some improvement in depressive s/s outside of pre-menstrual time Latuda- Caused increased glucose and cholesterol levels.  Abilify- helpful for depression. Wt. Gain.  Risperdal Geodon Adderall XR Vyvanse Concerta- Ineffective (short duration and  minimal improvement) Melatonin- Ineffective Trazodone Doxepin- not helpful for sleep initiation  Review of Systems:  Review of Systems  Cardiovascular:        She reports that she had one episode of palpitations with increased anxiety.   Musculoskeletal:  Negative for gait problem.  Neurological:  Negative for tremors.  Psychiatric/Behavioral:         Please refer to HPI   Medications: I  have reviewed the patient's current medications.  Current Outpatient Medications  Medication Sig Dispense Refill   ACETYLCARNITINE PO Take by mouth.     B Complex Vitamins (VITAMIN-B COMPLEX PO) Take by mouth.     ferrous sulfate 325 (65 FE) MG tablet Take 325 mg by mouth daily with breakfast.     ketorolac (TORADOL) 10 MG tablet Take 10 mg by mouth every 6 (six) hours as needed.     metFORMIN (GLUCOPHAGE-XR) 500 MG 24 hr tablet Take 2 tablets (1,000 mg total) by mouth every morning. 180 tablet 3   modafinil (PROVIGIL) 200 MG tablet Take 1/2-1 tab po q am prn severe fatigue 30 tablet 0   Multiple Minerals-Vitamins (CALCIUM-MAGNESIUM-ZINC-D3 PO) Take by mouth.     nabumetone (RELAFEN) 750 MG tablet Take 750 mg by mouth daily as needed.      norethindrone (MICRONOR) 0.35 MG tablet Take 1 tablet by mouth daily.     Omega-3 Fatty Acids (OMEGA 3 PO) Take by mouth.     vitamin E 100 UNIT capsule Take 100 Units by mouth daily.     atorvastatin (LIPITOR) 20 MG tablet Take 20 mg by mouth daily. (Patient not taking: No sig reported)     lamoTRIgine (LAMICTAL) 200 MG tablet Take 1.5 tablets (300 mg total) by mouth daily. 135 tablet 0   [START ON 04/06/2021] lisdexamfetamine (VYVANSE) 70 MG capsule Take 1 capsule (70 mg total) by mouth daily. 30 capsule 0   [START ON 03/09/2021] lisdexamfetamine (VYVANSE) 70 MG capsule Take 1 capsule (70 mg total) by mouth daily. 30 capsule 0   [START ON 02/09/2021] lisdexamfetamine (VYVANSE) 70 MG capsule Take 1 capsule (70 mg total) by mouth daily. 30 capsule 0   Multiple Vitamin (MULTIVITAMIN) tablet Take 1 tablet by mouth daily. (Patient not taking: Reported on 01/25/2021)     traZODone (DESYREL) 100 MG tablet Take 2 tablets (200 mg total) by mouth at bedtime. 180 tablet 0   No current facility-administered medications for this visit.    Medication Side Effects: None  Denies involuntary movements  Allergies:  Allergies  Allergen Reactions   Wellbutrin Xl [Bupropion]  Hives    Past Medical History:  Diagnosis Date   Asthma    Bursitis    Diabetes mellitus, type II (HCC)    Elevated cholesterol    Fatigue    H/O: depression    Migraines    Pica     Family History  Problem Relation Age of Onset   Diabetes Mother    Anxiety disorder Mother    Diabetes Father    Hypertension Father    Asthma Brother    ADD / ADHD Brother    Diabetes Brother    Anxiety disorder Maternal Aunt    Anxiety disorder Maternal Aunt    ADD / ADHD Son     Social History   Socioeconomic History   Marital status: Divorced    Spouse name: Not on file   Number of children: 2   Years of education: Not on file   Highest education level:  Bachelor's degree (e.g., BA, AB, BS)  Occupational History   Occupation: customer service  Tobacco Use   Smoking status: Never   Smokeless tobacco: Never  Substance and Sexual Activity   Alcohol use: No   Drug use: Never   Sexual activity: Not Currently    Partners: Male  Other Topics Concern   Not on file  Social History Narrative   Lives with children   Caffeine- none   Social Determinants of Health   Financial Resource Strain: Not on file  Food Insecurity: Not on file  Transportation Needs: Not on file  Physical Activity: Not on file  Stress: Not on file  Social Connections: Not on file  Intimate Partner Violence: Not on file    Past Medical History, Surgical history, Social history, and Family history were reviewed and updated as appropriate.   Please see review of systems for further details on the patient's review from today.   Objective:   Physical Exam:  BP (!) 128/98   Pulse 94   Physical Exam Neurological:     Mental Status: She is alert and oriented to person, place, and time.     Cranial Nerves: No dysarthria.  Psychiatric:        Attention and Perception: Attention and perception normal.        Mood and Affect: Mood normal.        Speech: Speech normal.        Behavior: Behavior normal.  Behavior is cooperative.        Thought Content: Thought content normal. Thought content is not paranoid or delusional. Thought content does not include homicidal or suicidal ideation. Thought content does not include homicidal or suicidal plan.        Cognition and Memory: Cognition and memory normal.        Judgment: Judgment normal.     Comments: Insight intact    Lab Review:  No results found for: NA, K, CL, CO2, GLUCOSE, BUN, CREATININE, CALCIUM, PROT, ALBUMIN, AST, ALT, ALKPHOS, BILITOT, GFRNONAA, GFRAA     Component Value Date/Time   WBC 12.8 (H) 05/30/2009 0528   RBC 2.82 (L) 05/30/2009 0528   HGB 8.2 DELTA CHECK NOTED (L) 05/30/2009 0528   HCT 24.3 (L) 05/30/2009 0528   PLT 188 05/30/2009 0528   MCV 86.3 05/30/2009 0528   MCHC 33.8 05/30/2009 0528   RDW 14.8 05/30/2009 0528    No results found for: POCLITH, LITHIUM   No results found for: PHENYTOIN, PHENOBARB, VALPROATE, CBMZ   .res Assessment: Plan:    Patient seen for 30 minutes and time spent counseling patient regarding possible treatment options.  She reports that she would like to try a higher dose of lamotrigine to potentially improve PMDD signs and symptoms since lower doses have been helpful for PMDD in the past.  Will increase lamotrigine to 300 mg daily.  Advised patient to monitor for rash and contact office if rash occurs.  Also discussed that some cognitive side effects can occur with higher doses of lamotrigine and to contact office if this occurs. Discussed using modafinil prn for severe fatigue and low motivation when PMDD signs and symptoms are interfering with her ability to function.  Discussed potential benefits, risks, and side effects of modafinil.  We will start trial of modafinil 200 mg 1/2 to 1 tablet daily as needed for severe fatigue. Continue Vyvanse 70 mg daily for attention deficit disorder. Continue trazodone 200 mg at bedtime for insomnia. Recommend continuing psychotherapy with  Sammuel Cooper,  LCSW. Patient follow-up with this provider in 3 months or sooner if clinically indicated. Patient advised to contact office with any questions, adverse effects, or acute worsening in signs and symptoms.  Bela was seen today for other and follow-up.  Diagnoses and all orders for this visit:  Attention deficit hyperactivity disorder (ADHD), combined type -     lisdexamfetamine (VYVANSE) 70 MG capsule; Take 1 capsule (70 mg total) by mouth daily. -     lisdexamfetamine (VYVANSE) 70 MG capsule; Take 1 capsule (70 mg total) by mouth daily. -     lisdexamfetamine (VYVANSE) 70 MG capsule; Take 1 capsule (70 mg total) by mouth daily.  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  Insomnia, unspecified type -     traZODone (DESYREL) 100 MG tablet; Take 2 tablets (200 mg total) by mouth at bedtime.  Other orders -     modafinil (PROVIGIL) 200 MG tablet; Take 1/2-1 tab po q am prn severe fatigue    Please see After Visit Summary for patient specific instructions.  Future Appointments  Date Time Provider [place]  02/08/2021  1:30 PM Lomax, Amy, NP [facility] None    No orders of the defined types were placed in this encounter.     -------------------------------

## 2021-02-08 ENCOUNTER — Encounter: Payer: Self-pay | Admitting: Family Medicine

## 2021-02-08 ENCOUNTER — Ambulatory Visit: Payer: No Typology Code available for payment source | Admitting: Family Medicine

## 2021-02-08 NOTE — Progress Notes (Deleted)
No chief complaint on file.    HISTORY OF PRESENT ILLNESS: 02/08/21 ALL:  Sheila James is a 45 y.o. female here today for follow up for migraines. She continues nabumetone '750mg'$     HISTORY (copied from Dr Gladstone Lighter previous note)  45 year old female with migraine headaches since age 57 years old. Patient reports bitemporal and occipital throbbing headaches with nausea, sensitive to light and sound. Sometimes she has visual aura and see spots. Triggering factors include menstrual cycle, lack of sleep and chocolate. She went to headache clinic in the past and has tried topiramate, Effexor and sumatriptan. Currently on nabumetone as needed which works well.   Headaches occur 4-5 times per month. She had a flareup of headaches in October to December 2021, had MRI of the brain which is unremarkable. Headaches have returned to baseline since then. She was having some memory and brain fog with her headaches at that time. The symptoms have resolved.   REVIEW OF SYSTEMS: Out of a complete 14 system review of symptoms, the patient complains only of the following symptoms, and all other reviewed systems are negative.   ALLERGIES: Allergies  Allergen Reactions   Wellbutrin Xl [Bupropion] Hives     HOME MEDICATIONS: Outpatient Medications Prior to Visit  Medication Sig Dispense Refill   ACETYLCARNITINE PO Take by mouth.     atorvastatin (LIPITOR) 20 MG tablet Take 20 mg by mouth daily. (Patient not taking: No sig reported)     B Complex Vitamins (VITAMIN-B COMPLEX PO) Take by mouth.     ferrous sulfate 325 (65 FE) MG tablet Take 325 mg by mouth daily with breakfast.     ketorolac (TORADOL) 10 MG tablet Take 10 mg by mouth every 6 (six) hours as needed.     lamoTRIgine (LAMICTAL) 200 MG tablet Take 1.5 tablets (300 mg total) by mouth daily. 135 tablet 0   [START ON 04/06/2021] lisdexamfetamine (VYVANSE) 70 MG capsule Take 1 capsule (70 mg total) by mouth daily. 30 capsule 0    [START ON 03/09/2021] lisdexamfetamine (VYVANSE) 70 MG capsule Take 1 capsule (70 mg total) by mouth daily. 30 capsule 0   [START ON 02/09/2021] lisdexamfetamine (VYVANSE) 70 MG capsule Take 1 capsule (70 mg total) by mouth daily. 30 capsule 0   metFORMIN (GLUCOPHAGE-XR) 500 MG 24 hr tablet Take 2 tablets (1,000 mg total) by mouth every morning. 180 tablet 3   modafinil (PROVIGIL) 200 MG tablet Take 1/2-1 tab po q am prn severe fatigue 30 tablet 0   Multiple Minerals-Vitamins (CALCIUM-MAGNESIUM-ZINC-D3 PO) Take by mouth.     Multiple Vitamin (MULTIVITAMIN) tablet Take 1 tablet by mouth daily. (Patient not taking: Reported on 01/25/2021)     nabumetone (RELAFEN) 750 MG tablet Take 750 mg by mouth daily as needed.      norethindrone (MICRONOR) 0.35 MG tablet Take 1 tablet by mouth daily.     Omega-3 Fatty Acids (OMEGA 3 PO) Take by mouth.     traZODone (DESYREL) 100 MG tablet Take 2 tablets (200 mg total) by mouth at bedtime. 180 tablet 0   vitamin E 100 UNIT capsule Take 100 Units by mouth daily.     No facility-administered medications prior to visit.     PAST MEDICAL HISTORY: Past Medical History:  Diagnosis Date   Asthma    Bursitis    Diabetes mellitus, type II (HCC)    Elevated cholesterol    Fatigue    H/O: depression    Migraines  Pica      PAST SURGICAL HISTORY: Past Surgical History:  Procedure Laterality Date   CESAREAN SECTION     WISDOM TOOTH EXTRACTION       FAMILY HISTORY: Family History  Problem Relation Age of Onset   Diabetes Mother    Anxiety disorder Mother    Diabetes Father    Hypertension Father    Asthma Brother    ADD / ADHD Brother    Diabetes Brother    Anxiety disorder Maternal Aunt    Anxiety disorder Maternal Aunt    ADD / ADHD Son      SOCIAL HISTORY: Social History   Socioeconomic History   Marital status: Divorced    Spouse name: Not on file   Number of children: 2   Years of education: Not on file   Highest education level:  Bachelor's degree (e.g., BA, AB, BS)  Occupational History   Occupation: customer service  Tobacco Use   Smoking status: Never   Smokeless tobacco: Never  Substance and Sexual Activity   Alcohol use: No   Drug use: Never   Sexual activity: Not Currently    Partners: Male  Other Topics Concern   Not on file  Social History Narrative   Lives with children   Caffeine- none   Social Determinants of Health   Financial Resource Strain: Not on file  Food Insecurity: Not on file  Transportation Needs: Not on file  Physical Activity: Not on file  Stress: Not on file  Social Connections: Not on file  Intimate Partner Violence: Not on file     PHYSICAL EXAM  There were no vitals filed for this visit. There is no height or weight on file to calculate BMI.   Generalized: Well developed, in no acute distress  Cardiology: normal rate and rhythm, no murmur auscultated  Respiratory: clear to auscultation bilaterally    Neurological examination  Mentation: Alert oriented to time, place, history taking. Follows all commands speech and language fluent Cranial nerve II-XII: Pupils were equal round reactive to light. Extraocular movements were full, visual field were full on confrontational test. Facial sensation and strength were normal. Uvula tongue midline. Head turning and shoulder shrug  were normal and symmetric. Motor: The motor testing reveals 5 over 5 strength of all 4 extremities. Good symmetric motor tone is noted throughout.  Sensory: Sensory testing is intact to soft touch on all 4 extremities. No evidence of extinction is noted.  Coordination: Cerebellar testing reveals good finger-nose-finger and heel-to-shin bilaterally.  Gait and station: Gait is normal. Tandem gait is normal. Romberg is negative. No drift is seen.  Reflexes: Deep tendon reflexes are symmetric and normal bilaterally.    DIAGNOSTIC DATA (LABS, IMAGING, TESTING) - I reviewed patient records, labs, notes,  testing and imaging myself where available.  Lab Results  Component Value Date   WBC 12.8 (H) 05/30/2009   HGB 8.2 DELTA CHECK NOTED (L) 05/30/2009   HCT 24.3 (L) 05/30/2009   MCV 86.3 05/30/2009   PLT 188 05/30/2009   No results found for: NA, K, CL, CO2, GLUCOSE, BUN, CREATININE, CALCIUM, PROT, ALBUMIN, AST, ALT, ALKPHOS, BILITOT, GFRNONAA, GFRAA No results found for: CHOL, HDL, LDLCALC, LDLDIRECT, TRIG, CHOLHDL No results found for: HGBA1C No results found for: VITAMINB12 No results found for: TSH  No flowsheet data found.   No flowsheet data found.   ASSESSMENT AND PLAN  45 y.o. year old female  has a past medical history of Asthma, Bursitis, Diabetes mellitus,  type II ([place]), Elevated cholesterol, Fatigue, H/O: depression, Migraines, and Pica. here with    No diagnosis found.   No orders of the defined types were placed in this encounter.    No orders of the defined types were placed in this encounter.     Debbora Presto, MSN, FNP-C 02/08/2021, 7:29 AM  [facility] [address], [place], [place] [address] [phone]

## 2021-02-16 ENCOUNTER — Telehealth: Payer: Self-pay | Admitting: Psychiatry

## 2021-02-16 DIAGNOSIS — R5383 Other fatigue: Secondary | ICD-10-CM

## 2021-02-16 MED ORDER — MODAFINIL 200 MG PO TABS: 200.0000 mg | ORAL_TABLET | Freq: Two times a day (BID) | ORAL | 2 refills | Status: DC

## 2021-02-16 NOTE — Telephone Encounter (Signed)
Please let her know script was sent for 200 mg BID. Please advise her to monitor for increased HR and anxiety.

## 2021-02-16 NOTE — Telephone Encounter (Signed)
Sheila James has been taking the Modafanil, but she has been taking 1-2 tabs per day of the '200mg'$ . She realizes this is over the dose you told her to take, she mistakenly read the instructions wrong. The '200mg'$  didn't help but the '400mg'$  is helping. If you can ok that dose, she will need a refill.Goes to :  [facility] in [place], [place]

## 2021-02-16 NOTE — Telephone Encounter (Signed)
Please advise 

## 2021-02-17 NOTE — Telephone Encounter (Signed)
LVM with info

## 2021-03-25 ENCOUNTER — Other Ambulatory Visit: Payer: Self-pay

## 2021-03-25 ENCOUNTER — Telehealth: Payer: Self-pay | Admitting: Psychiatry

## 2021-03-25 DIAGNOSIS — F902 Attention-deficit hyperactivity disorder, combined type: Secondary | ICD-10-CM

## 2021-03-25 DIAGNOSIS — F0634 Mood disorder due to known physiological condition with mixed features: Secondary | ICD-10-CM

## 2021-03-25 MED ORDER — LAMOTRIGINE 200 MG PO TABS: 300.0000 mg | ORAL_TABLET | Freq: Every day | ORAL | 0 refills | Status: DC

## 2021-03-25 NOTE — Telephone Encounter (Signed)
Rxs sent

## 2021-03-25 NOTE — Telephone Encounter (Signed)
Pt called requesting Rx for Lamotrigine for 30 day. Insurance will only pay for 30 day. Also, Rx for Vyvanse to [facility] @ [address] location. [address]

## 2021-04-01 ENCOUNTER — Telehealth: Payer: Self-pay | Admitting: Psychiatry

## 2021-04-01 ENCOUNTER — Other Ambulatory Visit: Payer: Self-pay

## 2021-04-01 DIAGNOSIS — F902 Attention-deficit hyperactivity disorder, combined type: Secondary | ICD-10-CM

## 2021-04-01 MED ORDER — LISDEXAMFETAMINE DIMESYLATE 70 MG PO CAPS: 70.0000 mg | ORAL_CAPSULE | Freq: Every day | ORAL | 0 refills | Status: DC

## 2021-04-01 NOTE — Telephone Encounter (Signed)
Next visit is 04/08/21. Sheila James is requesting a refill on her Vyvanse but [facility] told her that its too early to refill. Could this be called in for an early refill.   [id number] - [place], [place] - [address] AT [address]  Phone:  [phone]  Fax:  [phone]

## 2021-04-01 NOTE — Telephone Encounter (Signed)
Spoke to pharmacy pt needed a new rx sent

## 2021-04-08 ENCOUNTER — Telehealth (INDEPENDENT_AMBULATORY_CARE_PROVIDER_SITE_OTHER): Payer: No Typology Code available for payment source | Admitting: Psychiatry

## 2021-04-08 ENCOUNTER — Encounter: Payer: Self-pay | Admitting: Psychiatry

## 2021-04-08 DIAGNOSIS — F411 Generalized anxiety disorder: Secondary | ICD-10-CM

## 2021-04-08 DIAGNOSIS — F0634 Mood disorder due to known physiological condition with mixed features: Secondary | ICD-10-CM

## 2021-04-08 DIAGNOSIS — G4700 Insomnia, unspecified: Secondary | ICD-10-CM | POA: Diagnosis not present

## 2021-04-08 DIAGNOSIS — F3281 Premenstrual dysphoric disorder: Secondary | ICD-10-CM

## 2021-04-08 MED ORDER — LAMOTRIGINE 200 MG PO TABS: ORAL_TABLET | ORAL | 0 refills | Status: DC

## 2021-04-08 MED ORDER — TRAZODONE HCL 100 MG PO TABS: 200.0000 mg | ORAL_TABLET | Freq: Every day | ORAL | 0 refills | Status: DC

## 2021-04-08 MED ORDER — REXULTI 0.5 MG PO TABS: 0.5000 mg | ORAL_TABLET | Freq: Every day | ORAL | 0 refills | Status: DC

## 2021-04-08 MED ORDER — BREXPIPRAZOLE 1 MG PO TABS: 1.0000 mg | ORAL_TABLET | Freq: Every day | ORAL | 0 refills | Status: DC

## 2021-04-08 NOTE — Progress Notes (Signed)
Sheila James [id number] 06-May-1976 45 y.o.  Virtual Visit via Video Note  I connected with pt @ on 04/08/21 at  8:30 AM EDT by a video enabled telemedicine application and verified that I am speaking with the correct person using two identifiers.   I discussed the limitations of evaluation and management by telemedicine and the availability of in person appointments. The patient expressed understanding and agreed to proceed.  I discussed the assessment and treatment plan with the patient. The patient was provided an opportunity to ask questions and all were answered. The patient agreed with the plan and demonstrated an understanding of the instructions.   The patient was advised to call back or seek an in-person evaluation if the symptoms worsen or if the condition fails to improve as anticipated.  I provided 30 minutes of non-face-to-face time during this encounter.  The patient was located at home.  The provider was located at [place].   Thayer Headings, PMHNP   Subjective:   Patient ID:  Sheila James is a 45 y.o. (DOB 17-Jul-1975) female.  Chief Complaint:  Chief Complaint  Patient presents with   Anxiety   Depression   ADD   Sleeping Problem     HPI Sheila James presents for follow-up of anxiety, mood disturbance, ADHD, and sleep disturbance. Pt initially seen over video and then visit completed in person due to difficulties with sound on video connects.  She reports, "I have not been well." She reports that she has had a "nervous breakdown... all week." She reports that after sons started school they got sick and then she started feeling sick and missed work. She reports, "I did not recuperate and lost my voice." She reports that she contacted an ENT. Her voice returned and then she has URI s/s again. She then had a virtual visit and was dx'd with bronchitis and was having difficulty breathing. She reports that her income has been  significantly affected and this causes her anxiety about finances. Was unable to pay her rent this month and she is anxious about becoming homeless again. "Nothing is in my control."   She reports that she continues to have difficulty with PMDD s/s and not being able to anticipate severity and timing of s/s. She reports that PMDD s/s are improved with eating certain healthy foods and has recently been unable to afford these.   She reports increase in Lamictal has been helpful. She reports that she noticed low interest and motivation at the very end of the luteal phase. She was out of Lamictal due to pharmacy issues for at least a week. She re-started Lamictal 100 mg po qd earlier this week denies rash. She was without Vyvanse starting around 03/22/21 and recently restarted it.  She reports that she is "mentally exhausted." Reports that she has not been able to afford her medications this month. She reports that she is easily fatigued. She reports that she has also noticed increased anxiety prior to the onset of menses even prior to running out of Lamictal.   She reports that she has had difficulty sleeping due to shortness of breath and coughing. She was without Trazodone several days. Has had difficulty falling asleep the last couple of nights and then slept well once she fell asleep. Had some grogginess upon awakenings.   Appetite is controlled on Vyvanse. Appetite is "out of control" during her luteal phase if she is taking Vyvanse.   Reports that she had period of suicidal thoughts  and then thought about her sons. Denies suicidal intent.   She reports that during the first 2 weeks of her menstrual cycle she has had stable mood and anxiety when sleep has been adequate.   Reports that her sons are doing well academically this year. She reports that she is frustrated by sons not helping her with responsibilities and being "disrespectful."   Reports that her mother would periodically leave when she  was a child. She now lives 1.5 miles from foster parents and has not been communicating with them "because they treat [place] differently."   Reports one son's father helps financially but does not help with directly caring for child. Son has a new therapist and they have had difficulty reaching therapist.   She has not seen therapist recently.   She reports that she is currently at the end of her menstrual cycle.   Worked week of 03/08/21 and last day worked was 03/11/21.   Took Prednisone 20 mg for 5 days starting 03/30/21.   Past Psychiatric Medication Trials: Wellbutrin- allergic reaction. Sertraline Prozac Buspar Lamictal- Some improvement in depressive s/s outside of pre-menstrual time Latuda- Caused increased glucose and cholesterol levels.  Abilify- helpful for depression. Wt. Gain.  Risperdal Geodon Adderall XR Vyvanse Concerta- Ineffective (short duration and minimal improvement) Melatonin- Ineffective Trazodone Doxepin- not helpful for sleep initiation   Review of Systems:  Review of Systems  Respiratory:  Positive for cough.   Musculoskeletal:  Negative for gait problem.  Skin:  Negative for rash.  Psychiatric/Behavioral:         Please refer to HPI   Medications: I have reviewed the patient's current medications.  Current Outpatient Medications  Medication Sig Dispense Refill   ALBUTEROL SULFATE IN Inhale into the lungs.     B Complex Vitamins (VITAMIN-B COMPLEX PO) Take by mouth.     benzonatate (TESSALON) 100 MG capsule Take 100-200 mg by mouth every 8 (eight) hours as needed.     Brexpiprazole (REXULTI) 0.5 MG TABS Take 1 tablet (0.5 mg total) by mouth daily for 7 days. 7 tablet 0   brexpiprazole (REXULTI) 1 MG TABS tablet Take 1 tablet (1 mg total) by mouth daily. 21 tablet 0   lisdexamfetamine (VYVANSE) 70 MG capsule Take 1 capsule (70 mg total) by mouth daily. 30 capsule 0   LYSINE PO Take by mouth.     metFORMIN (GLUCOPHAGE-XR) 500 MG 24 hr tablet Take 2  tablets (1,000 mg total) by mouth every morning. 180 tablet 3   modafinil (PROVIGIL) 200 MG tablet Take 1 tablet (200 mg total) by mouth 2 (two) times daily. 30 tablet 2   nabumetone (RELAFEN) 750 MG tablet Take 750 mg by mouth daily as needed.      norethindrone (MICRONOR) 0.35 MG tablet Take 1 tablet by mouth daily.     Omega-3 Fatty Acids (OMEGA 3 PO) Take by mouth.     vitamin E 100 UNIT capsule Take 100 Units by mouth daily.     amoxicillin-clavulanate (AUGMENTIN) 875-125 MG tablet Take 1 tablet by mouth 2 (two) times daily.     atorvastatin (LIPITOR) 20 MG tablet Take 20 mg by mouth daily. (Patient not taking: No sig reported)     ferrous sulfate 325 (65 FE) MG tablet Take 325 mg by mouth daily with breakfast. (Patient not taking: Reported on 04/08/2021)     ketorolac (TORADOL) 10 MG tablet Take 10 mg by mouth every 6 (six) hours as needed. (Patient not taking: Reported on  04/08/2021)     lamoTRIgine (LAMICTAL) 200 MG tablet Take 0.5 tablets (100 mg total) by mouth daily for 14 days, THEN 1 tablet (200 mg total) daily for 14 days, THEN 1.5 tablets (300 mg total) daily. 45 tablet 0   lisdexamfetamine (VYVANSE) 70 MG capsule Take 1 capsule (70 mg total) by mouth daily. 30 capsule 0   lisdexamfetamine (VYVANSE) 70 MG capsule Take 1 capsule (70 mg total) by mouth daily. 30 capsule 0   Multiple Minerals-Vitamins (CALCIUM-MAGNESIUM-ZINC-D3 PO) Take by mouth. (Patient not taking: Reported on 04/08/2021)     Multiple Vitamin (MULTIVITAMIN) tablet Take 1 tablet by mouth daily. (Patient not taking: Reported on 01/25/2021)     promethazine-dextromethorphan (PROMETHAZINE-DM) 6.25-15 MG/5ML syrup SMARTSIG:5-10 Milliliter(s) By Mouth Every 8 Hours PRN     traZODone (DESYREL) 100 MG tablet Take 2 tablets (200 mg total) by mouth at bedtime. 180 tablet 0   No current facility-administered medications for this visit.    Medication Side Effects: None  Allergies:  Allergies  Allergen Reactions   Wellbutrin  Xl [Bupropion] Hives    Past Medical History:  Diagnosis Date   Asthma    Bursitis    Diabetes mellitus, type II (HCC)    Elevated cholesterol    Fatigue    H/O: depression    Migraines    Pica     Family History  Problem Relation Age of Onset   Diabetes Mother    Anxiety disorder Mother    Diabetes Father    Hypertension Father    Asthma Brother    ADD / ADHD Brother    Diabetes Brother    Anxiety disorder Maternal Aunt    Anxiety disorder Maternal Aunt    ADD / ADHD Son     Social History   Socioeconomic History   Marital status: Divorced    Spouse name: Not on file   Number of children: 2   Years of education: Not on file   Highest education level: Bachelor's degree (e.g., BA, AB, BS)  Occupational History   Occupation: customer service  Tobacco Use   Smoking status: Never   Smokeless tobacco: Never  Substance and Sexual Activity   Alcohol use: No   Drug use: Never   Sexual activity: Not Currently    Partners: Male  Other Topics Concern   Not on file  Social History Narrative   Lives with children   Caffeine- none   Social Determinants of Health   Financial Resource Strain: Not on file  Food Insecurity: Not on file  Transportation Needs: Not on file  Physical Activity: Not on file  Stress: Not on file  Social Connections: Not on file  Intimate Partner Violence: Not on file    Past Medical History, Surgical history, Social history, and Family history were reviewed and updated as appropriate.   Please see review of systems for further details on the patient's review from today.   Objective:   Physical Exam:  There were no vitals taken for this visit.  Physical Exam Pulmonary:     Comments: Coughing throughout exam. Occasional shortness of breath after coughing Musculoskeletal:        General: No deformity.  Neurological:     Mental Status: She is alert and oriented to person, place, and time.     Coordination: Coordination normal.   Psychiatric:        Attention and Perception: Attention and perception normal. She does not perceive auditory or visual hallucinations.  Mood and Affect: Mood is anxious and depressed. Affect is not labile, blunt, angry or inappropriate.        Speech: Speech normal.        Behavior: Behavior normal.        Thought Content: Thought content normal. Thought content is not paranoid or delusional. Thought content does not include homicidal or suicidal ideation. Thought content does not include homicidal or suicidal plan.        Cognition and Memory: Cognition and memory normal.        Judgment: Judgment normal.     Comments: Insight intact    Lab Review:  No results found for: NA, K, CL, CO2, GLUCOSE, BUN, CREATININE, CALCIUM, PROT, ALBUMIN, AST, ALT, ALKPHOS, BILITOT, GFRNONAA, GFRAA     Component Value Date/Time   WBC 12.8 (H) 05/30/2009 0528   RBC 2.82 (L) 05/30/2009 0528   HGB 8.2 DELTA CHECK NOTED (L) 05/30/2009 0528   HCT 24.3 (L) 05/30/2009 0528   PLT 188 05/30/2009 0528   MCV 86.3 05/30/2009 0528   MCHC 33.8 05/30/2009 0528   RDW 14.8 05/30/2009 0528    No results found for: POCLITH, LITHIUM   No results found for: PHENYTOIN, PHENOBARB, VALPROATE, CBMZ   .res Assessment: Plan:    Pt seen for 30 minutes and time spent discussing that exacerbation of mood and anxiety s/s is most likely multi-factorial in response to acute respiratory illness, possible worsening mood s/s with steroids, lapse in medications, diminished sleep, financial stressors, and timing of PMDD s/s. Anticipate improvement in acute s/s with completing of prednisone, improvement in respiratory illness, completion of menses, and re-starting psychiatric medications.  Case staffed with Dr. Clovis Pu. Will gradually titrate Lamictal to previous dose of 300 mg daily to prevent risk of [employer] syndrome after a week lapse in Lamictal. Pt to continue 100 mg dose for a full 2 weeks, then increase to 200 mg po  qd for 2 weeks, then 300 mg daily for mood s/s. Advised pt to monitor for rash and to contact office immediately if rash occurs.  Discussed potential benefits, risks, and side effects of Rexulti and discussed that this may be helpful for augmentation and more rapid stabilization of recent acute anxiety and mood s/s with goal to be able to return to work as soon as possible. Reviewed potential metabolic side effects associated with atypical antipsychotics, as well as potential risk for movement side effects. Advised pt to contact office if movement side effects occur. Pt agrees to trial of Rexulti. Will start Rexulti 0.5 mg po qd for one week, then increase to 1 mg po qd for mood s/s.  Continue with recent re-start in Vyvanse for ADHD s/s. Continue Trazodone 200 mg po QHS for insomnia. Continue Modafinil 200 mg po BID for excessive daytime somnolence and fatigue.  Pt to follow-up in 4 weeks or sooner if clinically indicated.  Patient advised to contact office with any questions, adverse effects, or acute worsening in signs and symptoms.   Sheila James was seen today for anxiety, depression, add and sleeping problem.  Diagnoses and all orders for this visit:  PMDD (premenstrual dysphoric disorder)  Mood disorder due to known physiological condition with mixed features -     lamoTRIgine (LAMICTAL) 200 MG tablet; Take 0.5 tablets (100 mg total) by mouth daily for 14 days, THEN 1 tablet (200 mg total) daily for 14 days, THEN 1.5 tablets (300 mg total) daily. -     Brexpiprazole (REXULTI) 0.5 MG TABS; Take  1 tablet (0.5 mg total) by mouth daily for 7 days. -     brexpiprazole (REXULTI) 1 MG TABS tablet; Take 1 tablet (1 mg total) by mouth daily.  Insomnia, unspecified type -     traZODone (DESYREL) 100 MG tablet; Take 2 tablets (200 mg total) by mouth at bedtime.  Generalized anxiety disorder    Please see After Visit Summary for patient specific instructions.  Future Appointments  Date Time  Provider [place]  04/19/2021 10:00 AM Barnie Del, LCSW [facility] None  05/03/2021 10:00 AM Barnie Del, LCSW [facility] None  05/06/2021 11:00 AM Thayer Headings, PMHNP [facility] None     No orders of the defined types were placed in this encounter.     -------------------------------

## 2021-04-12 ENCOUNTER — Telehealth: Payer: Self-pay | Admitting: Psychiatry

## 2021-04-12 NOTE — Telephone Encounter (Signed)
Pt LVM asking if [employer] forms were received by you and being worked on?  Pls advise.

## 2021-04-13 NOTE — Telephone Encounter (Signed)
Discussed situation with pt, she reports she has been out of work since 03/12/2021. Her voice sounded very rough and hoarse, she has been diagnosed with bronchititis but doesn't seem to be improving. Pt very stressed about  situation. With her job she talks all day long for 8 hours and with her voice like this she is not able to. Has no money to go to doctor again. Pt just wants to feel better and get back to work. Discussed with her when she is stressed and anxious it also effects her acute symptoms and can prolong it. Encouraged her to reach out to family and friends for support to be able to get medication or go to doctor.   Will work on her paperwork today for Sheila James to review

## 2021-04-14 DIAGNOSIS — Z0289 Encounter for other administrative examinations: Secondary | ICD-10-CM

## 2021-04-15 ENCOUNTER — Telehealth: Payer: Self-pay | Admitting: Psychiatry

## 2021-04-15 NOTE — Telephone Encounter (Signed)
Sheila James called regarding her short term disability form that was sent in to [facility]. The form was due on 10/11 and because they did not get it by this date they need a reason why it was delayed in being sent by this date? I told her we are waiting for Sheila James to review it. Sheila James also said her visit with Sheila James is on 05/06/21 and would like the RTW date to be on Monday 05/10/21. Her number is [phone].

## 2021-04-15 NOTE — Telephone Encounter (Signed)
Noted will check if form is still with provider or faxed and update information

## 2021-04-16 NOTE — Telephone Encounter (Signed)
Noted  

## 2021-04-16 NOTE — Telephone Encounter (Signed)
Provider was out of the office on 04/13/21. Form reviewed, signed, and placed in office inbox when provider returned to office. RTW date said to be determined after re-evaluation on 05/06/21.

## 2021-04-19 ENCOUNTER — Ambulatory Visit (INDEPENDENT_AMBULATORY_CARE_PROVIDER_SITE_OTHER): Payer: No Typology Code available for payment source | Admitting: Addiction (Substance Use Disorder)

## 2021-04-19 ENCOUNTER — Telehealth: Payer: No Typology Code available for payment source | Admitting: Psychiatry

## 2021-04-19 DIAGNOSIS — F0634 Mood disorder due to known physiological condition with mixed features: Secondary | ICD-10-CM

## 2021-04-19 NOTE — Progress Notes (Signed)
[facility] Counselor/Therapist Progress Note  Patient ID: TAKIRAH BINFORD, MRN: [id number],    Date: 04/19/2021  Time Spent: 31mns  Treatment Type: Individual Therapy  Reported Symptoms: sick & destabilized  Mental Status Exam:  Appearance:   Casual and Well Groomed     Behavior:  Appropriate and Sharing  Motor:  Normal   Speech/Language:   Clear and Coherent and Pressured  Affect:  Appropriate and Congruent  Mood:  anxious and labile   Thought process:  normal    Thought content:    Obsessions and Rumination  Sensory/Perceptual disturbances:    WNL  Orientation:  x4  Attention:  Good  Concentration:  Fair  Memory:  WNL  Fund of knowledge:   Good  Insight:    Fair  Judgment:   Fair  Impulse Control:  Fair   Risk Assessment: Danger to Self:  No Self-injurious Behavior: No Danger to Others: No Duty to Warn:no Physical Aggression / Violence:No  Access to Firearms a concern: No  Gang Involvement:No   Virtual Visit via VIDEO: I connected with client by MyChart video enabled telemedicine/telehealth application, with their informed consent, and verified client privacy and that I am speaking with the correct person using two identifiers. I discussed the limitations, risks, security and privacy concerns of performing psychotherapy and management service virtually and confirmed their location. I also discussed with the patient that there may be a patient responsible charge related to this service and to confirm with the front desk if their insurance covers teletherapy. I also discussed with the patient the availability of in person appointments. The patient expressed understanding and agreed to proceed. I discussed the treatment planning with the client. The client was provided an opportunity to ask questions and all were answered. The client agreed with the plan and demonstrated an understanding of the instructions. The client was advised to call our office if  symptoms worsen or feel they are in a crisis state and need immediate contact. Client also reminded of a crisis line number and to use 9-1-1 if there's an emergency.  Therapist Location: office; Client Location: home.  Subjective: Client reported feeling sick and having had a bad few months after struggling to get her meds paid for, not taking them as a result, and completely destabilizing. Client articulated the despair and depression funk it through her in, making her have very little will to live or get out of bed in the morning. Therapist assessed for client safety and client denied SI/HI/AVH and reported her sons were her anchor to keep going and shes so thankful to have them in her life. Client feeling incapable of being really responsible in parenting them well with [place] she has her own mood instability, but is able to get all their basic needs met. Therapist used MI & RPT with client to validate her struggles, help her process, and take steps to continue to re-stabilize her MH and their lives. Client working hard to rest and return back to work asap.   Interventions: Motivational Interviewing, Solution-Oriented/Positive Psychology, and RPT  Diagnosis:   ICD-10-CM   1. Mood disorder due to known physiological condition with mixed features  F06.34         Plan of Care:  Client to return for weekly therapy with KSammuel Cooper therapist, to review again in 6 months.  Client to engage in positive self talk and challenging negative internal ruminations and self talk causing client to be overly anxious and  worried using CBT, on daily practice. Client to engage in mindfulness: ie body scans each eveneing to help process and discharge emotional distress & recognize emotions. Client to utilize BSP (brainspotting) with therapist to help client regulate their anxiety in a somatic- felt body sense way: (ie by working to reduce muscle tension, ruminations, increased heart rate, constant worrying  and feeling "hyper" ) by decreasing anxiety by 33% in the next 6 months.  Client to prioritize sleep 8+ hours each week night AEB going to bed by 10pm each night.    Barnie Del, LCSW, LCAS, CCTP, CCS-I, BSP

## 2021-04-20 ENCOUNTER — Telehealth: Payer: Self-pay | Admitting: Psychiatry

## 2021-04-21 NOTE — Telephone Encounter (Signed)
Follow up questionnaire received, completed and faxed to [facility].

## 2021-04-28 ENCOUNTER — Telehealth: Payer: Self-pay | Admitting: Psychiatry

## 2021-04-28 ENCOUNTER — Telehealth: Payer: Self-pay | Admitting: Addiction (Substance Use Disorder)

## 2021-04-28 NOTE — Telephone Encounter (Signed)
Pt called asking if she can be seen for an urgent visit before 10/31..I told her you didn't have anything and that you work half a day on Friday (and advised her about the [facility]).  She wanted me to leave you a message anyway.Marland Kitchen

## 2021-04-28 NOTE — Telephone Encounter (Signed)
Error...message not needed now, appt made instead

## 2021-04-29 NOTE — Telephone Encounter (Signed)
I returned a call to her and LVM  that Monday would be the earliest you could see her.

## 2021-04-30 ENCOUNTER — Telehealth (INDEPENDENT_AMBULATORY_CARE_PROVIDER_SITE_OTHER): Payer: No Typology Code available for payment source | Admitting: Psychiatry

## 2021-04-30 ENCOUNTER — Encounter: Payer: Self-pay | Admitting: Psychiatry

## 2021-04-30 ENCOUNTER — Other Ambulatory Visit: Payer: Self-pay

## 2021-04-30 DIAGNOSIS — F0634 Mood disorder due to known physiological condition with mixed features: Secondary | ICD-10-CM | POA: Diagnosis not present

## 2021-04-30 DIAGNOSIS — F902 Attention-deficit hyperactivity disorder, combined type: Secondary | ICD-10-CM

## 2021-04-30 DIAGNOSIS — R5383 Other fatigue: Secondary | ICD-10-CM | POA: Diagnosis not present

## 2021-04-30 DIAGNOSIS — F3281 Premenstrual dysphoric disorder: Secondary | ICD-10-CM

## 2021-04-30 MED ORDER — LAMOTRIGINE 200 MG PO TABS: 300.0000 mg | ORAL_TABLET | Freq: Every day | ORAL | 2 refills | Status: DC

## 2021-04-30 MED ORDER — BREXPIPRAZOLE 2 MG PO TABS: 2.0000 mg | ORAL_TABLET | Freq: Every day | ORAL | 1 refills | Status: DC

## 2021-04-30 MED ORDER — LISDEXAMFETAMINE DIMESYLATE 70 MG PO CAPS: 70.0000 mg | ORAL_CAPSULE | Freq: Every day | ORAL | 0 refills | Status: DC

## 2021-04-30 MED ORDER — MODAFINIL 200 MG PO TABS: 200.0000 mg | ORAL_TABLET | Freq: Two times a day (BID) | ORAL | 2 refills | Status: DC

## 2021-04-30 NOTE — Progress Notes (Signed)
Sheila James [id number] [birth year] 45 y.o.  Virtual Visit via Video Note  I connected with pt @ on 04/30/21 at 11:00 AM EDT by a video enabled telemedicine application and verified that I am speaking with the correct person using two identifiers.   I discussed the limitations of evaluation and management by telemedicine and the availability of in person appointments. The patient expressed understanding and agreed to proceed.  I discussed the assessment and treatment plan with the patient. The patient was provided an opportunity to ask questions and all were answered. The patient agreed with the plan and demonstrated an understanding of the instructions.   The patient was advised to call back or seek an in-person evaluation if the symptoms worsen or if the condition fails to improve as anticipated.  I provided 30 minutes of non-face-to-face time during this encounter.  The patient was located at home.  The provider was located at [place].   Thayer Headings, PMHNP   Subjective:   Patient ID:  Sheila James is a 45 y.o. (DOB Nov 21, 1975) female.  Chief Complaint:  Chief Complaint  Patient presents with   Other    PMDD   Anxiety   Depression    HPI Sheila James presents for follow-up of PMDD, depression, anxiety, and insomnia. She reports "getting better." She reports having "several melt downs and was not able to work" several days this past week. She reports that she missed part of Monday, Tuesday, and all of Wednesday due to PMDD s/s. She reports that meltdowns were associated with irritability "hopelessness," feeling overwhelmed, and significant difficulty with sleep. She reports that she has to take two Trazodone tablets when she has PMDD. She reports that her ST disability was denied "and I panicked...heart started pounding." She reports that she returned to work Monday. She reports that she plans to initiate an appeal. She reports that she is  continuing to lose her voice after having bronchitis. She reports that PMDD s/s coincided with bronchitis s/s and acute exacerbation of mood and anxiety. She reports that most recent PMDD cycle ended yesterday and has noticed some improvement in mood s/s and motivation. She reports that she had increased depression and severe anxiety. She reports prior to PMDD s/s she felt lethargic. She notices lethargy when she does not have Modafinil. She reports that her energy and motivation have been very low. She reports that she has not been planning and preparing meals for the last couple of months like she typically would.  She reports that her mind has been racing and having concentration difficulties- "I just blank out."   She reports that she has been having anxiety in response to situational stressors, to include financial stress. She has had increased worry. She reports that she is using light therapy, aromatherapy, and alpha stimulator to help with PMDD s/s.  She reports that she is trying to exercise to improve mood and anxiety.   Reports losing 20 lbs over 6 weeks due to food scarcity.   She reports that she had SI on Wednesday and called the Crisis line. Denies SI currently.   Currently at 200 mg dose with Lamotrigine titration  Past Psychiatric Medication Trials: Wellbutrin- allergic reaction. Sertraline Prozac Buspar Lamictal- Some improvement in depressive s/s outside of pre-menstrual time Latuda- Caused increased glucose and cholesterol levels.  Abilify- helpful for depression. Wt. Gain.  Risperdal Geodon Adderall XR Vyvanse Concerta- Ineffective (short duration and minimal improvement) Melatonin- Ineffective Trazodone Doxepin- not helpful for sleep initiation  Review of Systems:  Review of Systems  Musculoskeletal:  Negative for gait problem.  Skin:  Negative for rash.  Neurological:  Negative for tremors.  Psychiatric/Behavioral:         Please refer to HPI   Medications:  I have reviewed the patient's current medications.  Current Outpatient Medications  Medication Sig Dispense Refill   norethindrone (MICRONOR) 0.35 MG tablet Take 1 tablet by mouth daily.     ALBUTEROL SULFATE IN Inhale into the lungs.     amoxicillin-clavulanate (AUGMENTIN) 875-125 MG tablet Take 1 tablet by mouth 2 (two) times daily.     atorvastatin (LIPITOR) 20 MG tablet Take 20 mg by mouth daily. (Patient not taking: No sig reported)     B Complex Vitamins (VITAMIN-B COMPLEX PO) Take by mouth.     benzonatate (TESSALON) 100 MG capsule Take 100-200 mg by mouth every 8 (eight) hours as needed.     brexpiprazole (REXULTI) 2 MG TABS tablet Take 1 tablet (2 mg total) by mouth daily. 30 tablet 1   ferrous sulfate 325 (65 FE) MG tablet Take 325 mg by mouth daily with breakfast. (Patient not taking: Reported on 04/08/2021)     ketorolac (TORADOL) 10 MG tablet Take 10 mg by mouth every 6 (six) hours as needed. (Patient not taking: Reported on 04/08/2021)     lamoTRIgine (LAMICTAL) 200 MG tablet Take 1.5 tablets (300 mg total) by mouth daily. 45 tablet 2   [START ON 06/25/2021] lisdexamfetamine (VYVANSE) 70 MG capsule Take 1 capsule (70 mg total) by mouth daily. 30 capsule 0   [START ON 05/28/2021] lisdexamfetamine (VYVANSE) 70 MG capsule Take 1 capsule (70 mg total) by mouth daily. 30 capsule 0   lisdexamfetamine (VYVANSE) 70 MG capsule Take 1 capsule (70 mg total) by mouth daily. 30 capsule 0   LYSINE PO Take by mouth.     metFORMIN (GLUCOPHAGE-XR) 500 MG 24 hr tablet Take 2 tablets (1,000 mg total) by mouth every morning. 180 tablet 3   [START ON 05/15/2021] modafinil (PROVIGIL) 200 MG tablet Take 1 tablet (200 mg total) by mouth 2 (two) times daily. 30 tablet 2   Multiple Minerals-Vitamins (CALCIUM-MAGNESIUM-ZINC-D3 PO) Take by mouth. (Patient not taking: Reported on 04/08/2021)     Multiple Vitamin (MULTIVITAMIN) tablet Take 1 tablet by mouth daily. (Patient not taking: Reported on 01/25/2021)      nabumetone (RELAFEN) 750 MG tablet Take 750 mg by mouth daily as needed.      Omega-3 Fatty Acids (OMEGA 3 PO) Take by mouth.     promethazine-dextromethorphan (PROMETHAZINE-DM) 6.25-15 MG/5ML syrup SMARTSIG:5-10 Milliliter(s) By Mouth Every 8 Hours PRN     traZODone (DESYREL) 100 MG tablet Take 2 tablets (200 mg total) by mouth at bedtime. 180 tablet 0   vitamin E 100 UNIT capsule Take 100 Units by mouth daily.     No current facility-administered medications for this visit.    Medication Side Effects: None  Allergies:  Allergies  Allergen Reactions   Wellbutrin Xl [Bupropion] Hives    Past Medical History:  Diagnosis Date   Asthma    Bursitis    Diabetes mellitus, type II (HCC)    Elevated cholesterol    Fatigue    H/O: depression    Migraines    Pica     Family History  Problem Relation Age of Onset   Diabetes Mother    Anxiety disorder Mother    Diabetes Father    Hypertension Father    Asthma Brother  ADD / ADHD Brother    Diabetes Brother    Anxiety disorder Maternal Aunt    Anxiety disorder Maternal Aunt    ADD / ADHD Son     Social History   Socioeconomic History   Marital status: Divorced    Spouse name: Not on file   Number of children: 2   Years of education: Not on file   Highest education level: Bachelor's degree (e.g., BA, AB, BS)  Occupational History   Occupation: customer service  Tobacco Use   Smoking status: Never   Smokeless tobacco: Never  Substance and Sexual Activity   Alcohol use: No   Drug use: Never   Sexual activity: Not Currently    Partners: Male  Other Topics Concern   Not on file  Social History Narrative   Lives with children   Caffeine- none   Social Determinants of Health   Financial Resource Strain: Not on file  Food Insecurity: Not on file  Transportation Needs: Not on file  Physical Activity: Not on file  Stress: Not on file  Social Connections: Not on file  Intimate Partner Violence: Not on file     Past Medical History, Surgical history, Social history, and Family history were reviewed and updated as appropriate.   Please see review of systems for further details on the patient's review from today.   Objective:   Physical Exam:  Wt 211 lb (95.7 kg)   BMI 33.05 kg/m   Physical Exam Neurological:     Mental Status: She is alert and oriented to person, place, and time.     Cranial Nerves: No dysarthria.  Psychiatric:        Attention and Perception: Perception normal.        Mood and Affect: Mood is anxious and depressed.        Speech: Speech normal.        Behavior: Behavior is cooperative.        Thought Content: Thought content normal. Thought content is not paranoid or delusional. Thought content does not include homicidal or suicidal ideation. Thought content does not include homicidal or suicidal plan.        Cognition and Memory: Cognition and memory normal.        Judgment: Judgment normal.     Comments: Insight intact Some occasional distractibility    Lab Review:  No results found for: NA, K, CL, CO2, GLUCOSE, BUN, CREATININE, CALCIUM, PROT, ALBUMIN, AST, ALT, ALKPHOS, BILITOT, GFRNONAA, GFRAA     Component Value Date/Time   WBC 12.8 (H) 05/30/2009 0528   RBC 2.82 (L) 05/30/2009 0528   HGB 8.2 DELTA CHECK NOTED (L) 05/30/2009 0528   HCT 24.3 (L) 05/30/2009 0528   PLT 188 05/30/2009 0528   MCV 86.3 05/30/2009 0528   MCHC 33.8 05/30/2009 0528   RDW 14.8 05/30/2009 0528    No results found for: POCLITH, LITHIUM   No results found for: PHENYTOIN, PHENOBARB, VALPROATE, CBMZ   .res Assessment: Plan:    Pt seen for 30 minutes and time spent discussing recent medical leave and treatment options. Discussed potential benefits, risks, and side effects of increasing Rexulti to 2 mg po qd to further improve mood s/s. Pt agrees to increase in dose.  Plan is to continue to increase Lamictal gradually every 3 weeks to previous dose of 300 mg po qd.  Continue  Vyvanse 70 mg po qd for ADHD.  Continue Provigil 200 mg po BID for fatigue.  Continue Trazodone 200  mg po QHS for insomnia.  Pt to follow-up with this provider in approximately 6 weeks or sooner if clinically indicated.  Recommend continuing therapy with Sammuel Cooper, LCSW.  Patient advised to contact office with any questions, adverse effects, or acute worsening in signs and symptoms.   Sheila James was seen today for other, anxiety and depression.  Diagnoses and all orders for this visit:  Mood disorder due to known physiological condition with mixed features -     brexpiprazole (REXULTI) 2 MG TABS tablet; Take 1 tablet (2 mg total) by mouth daily. -     lamoTRIgine (LAMICTAL) 200 MG tablet; Take 1.5 tablets (300 mg total) by mouth daily.  PMDD (premenstrual dysphoric disorder) -     brexpiprazole (REXULTI) 2 MG TABS tablet; Take 1 tablet (2 mg total) by mouth daily. -     lamoTRIgine (LAMICTAL) 200 MG tablet; Take 1.5 tablets (300 mg total) by mouth daily.  Attention deficit hyperactivity disorder (ADHD), combined type -     lisdexamfetamine (VYVANSE) 70 MG capsule; Take 1 capsule (70 mg total) by mouth daily. -     lisdexamfetamine (VYVANSE) 70 MG capsule; Take 1 capsule (70 mg total) by mouth daily. -     lisdexamfetamine (VYVANSE) 70 MG capsule; Take 1 capsule (70 mg total) by mouth daily.  Fatigue, unspecified type -     modafinil (PROVIGIL) 200 MG tablet; Take 1 tablet (200 mg total) by mouth 2 (two) times daily.    Please see After Visit Summary for patient specific instructions.  Future Appointments  Date Time Provider [place]  05/03/2021 10:00 AM Barnie Del, LCSW [facility] None  06/15/2021  9:00 AM Thayer Headings, PMHNP [facility] None  07/08/2021  9:00 AM Barnie Del, LCSW [facility] None  07/22/2021 10:00 AM Barnie Del, LCSW [facility] None  08/05/2021 10:00 AM Barnie Del, LCSW [facility] None  08/19/2021 10:00 AM Barnie Del, LCSW [facility] None    No orders of the defined types  were placed in this encounter.     -------------------------------

## 2021-05-03 ENCOUNTER — Ambulatory Visit (INDEPENDENT_AMBULATORY_CARE_PROVIDER_SITE_OTHER): Payer: No Typology Code available for payment source | Admitting: Addiction (Substance Use Disorder)

## 2021-05-03 DIAGNOSIS — F0634 Mood disorder due to known physiological condition with mixed features: Secondary | ICD-10-CM

## 2021-05-03 NOTE — Progress Notes (Signed)
[facility] Counselor/Therapist Progress Note  Patient ID: OMOLARA CAROL, MRN: [id number],    Date: 05/03/2021  Time Spent: 42mins  Treatment Type: Individual Therapy  Reported Symptoms: sick and unable to motivate  Mental Status Exam:  Appearance:   Casual and Well Groomed     Behavior:  Appropriate and Sharing  Motor:  Normal   Speech/Language:   Clear and Coherent  Affect:  Appropriate and Congruent  Mood:  normal   Thought process:  normal    Thought content:    Obsessions and Rumination  Sensory/Perceptual disturbances:    WNL  Orientation:  x4  Attention:  Fair  Concentration:  Fair  Memory:  WNL  Fund of knowledge:   Good  Insight:    Fair  Judgment:   Fair  Impulse Control:  Fair   Risk Assessment: Danger to Self:  No Self-injurious Behavior: No Danger to Others: No Duty to Warn:no Physical Aggression / Violence:No  Access to Firearms a concern: No  Gang Involvement:No   Virtual Visit via VIDEO: I connected with client by MyChart video enabled telemedicine/telehealth application, with their informed consent, and verified client privacy and that I am speaking with the correct person using two identifiers. I discussed the limitations, risks, security and privacy concerns of performing psychotherapy and management service virtually and confirmed their location. I also discussed with the patient that there may be a patient responsible charge related to this service and to confirm with the front desk if their insurance covers teletherapy. I also discussed with the patient the availability of in person appointments. The patient expressed understanding and agreed to proceed. I discussed the treatment planning with the client. The client was provided an opportunity to ask questions and all were answered. The client agreed with the plan and demonstrated an understanding of the instructions. The client was advised to call our office if symptoms worsen or feel  they are in a crisis state and need immediate contact. Client also reminded of a crisis line number and to use 9-1-1 if there's an emergency.  Therapist Location: office; Client Location: home.  Subjective: Client reported continuing to be sick and have issues keeping up with her daily needs to care for herself after caring for her kids. Client expressed the struggle with feeling alone throughout her [place] swings and client reported a consistent issue with support throughout her life that has made her more resilient overall but also discouraged bec she feels alone with a looming feeling of not getting better. Client shared her sons medical condition and his pain and despairing anxiety that he wont get better. Therapist used MI & grief therapy with client to support her and validate her pain dealing with her struggle and her son's despair with hiss medical condition. Client attempting to rest so she can return back to work.Therapist assessed for client safety and client denied SI/HI/AVH  Interventions: Motivational Interviewing, Grief Therapy, and RPT  Diagnosis:   ICD-10-CM   1. Mood disorder due to known physiological condition with mixed features  F06.34         Plan of Care:  Client to return for weekly therapy with Sammuel Cooper, therapist, to review again in 6 months.  Client to engage in positive self talk and challenging negative internal ruminations and self talk causing client to be overly anxious and worried using CBT, on daily practice. Client to engage in mindfulness: ie body scans each eveneing to help process and discharge emotional distress &  recognize emotions. Client to utilize BSP (brainspotting) with therapist to help client regulate their anxiety in a somatic- felt body sense way: (ie by working to reduce muscle tension, ruminations, increased heart rate, constant worrying and feeling "hyper" ) by decreasing anxiety by 33% in the next 6 months.  Client to prioritize sleep 8+ hours  each week night AEB going to bed by 10pm each night.    Barnie Del, LCSW, LCAS, CCTP, CCS-I, BSP

## 2021-05-04 ENCOUNTER — Ambulatory Visit: Payer: No Typology Code available for payment source | Admitting: Psychiatry

## 2021-05-04 NOTE — Telephone Encounter (Signed)
Error

## 2021-05-06 ENCOUNTER — Ambulatory Visit: Payer: No Typology Code available for payment source | Admitting: Psychiatry

## 2021-05-20 ENCOUNTER — Telehealth: Payer: Self-pay

## 2021-05-20 NOTE — Telephone Encounter (Signed)
Prior Authorization submitted and approved for REXULTI 2 MG effective 05/08/2021-05/09/2022, PA# [id number] with [facility]

## 2021-06-15 ENCOUNTER — Telehealth: Payer: Self-pay | Admitting: Psychiatry

## 2021-06-15 ENCOUNTER — Telehealth: Payer: No Typology Code available for payment source | Admitting: Psychiatry

## 2021-06-15 ENCOUNTER — Other Ambulatory Visit: Payer: Self-pay

## 2021-06-15 ENCOUNTER — Encounter: Payer: Self-pay | Admitting: Psychiatry

## 2021-06-15 DIAGNOSIS — G4700 Insomnia, unspecified: Secondary | ICD-10-CM | POA: Diagnosis not present

## 2021-06-15 DIAGNOSIS — F902 Attention-deficit hyperactivity disorder, combined type: Secondary | ICD-10-CM

## 2021-06-15 MED ORDER — LISDEXAMFETAMINE DIMESYLATE 70 MG PO CAPS: 70.0000 mg | ORAL_CAPSULE | Freq: Every day | ORAL | 0 refills | Status: DC

## 2021-06-15 MED ORDER — TRAZODONE HCL 100 MG PO TABS: 200.0000 mg | ORAL_TABLET | Freq: Every day | ORAL | 0 refills | Status: DC

## 2021-06-15 NOTE — Telephone Encounter (Signed)
Pt informed samples ready  

## 2021-06-15 NOTE — Progress Notes (Signed)
Sheila James [id number] [birth year] 45 y.o.  Virtual Visit via Video Note  I connected with pt @ on 06/16/21 at  9:00 AM EST by a video enabled telemedicine application and verified that I am speaking with the correct person using two identifiers.   I discussed the limitations of evaluation and management by telemedicine and the availability of in person appointments. The patient expressed understanding and agreed to proceed.  I discussed the assessment and treatment plan with the patient. The patient was provided an opportunity to ask questions and all were answered. The patient agreed with the plan and demonstrated an understanding of the instructions.   The patient was advised to call back or seek an in-person evaluation if the symptoms worsen or if the condition fails to improve as anticipated.  I provided 30 minutes of non-face-to-face time during this encounter.  The patient was located at home.  The provider was located at [place].   Thayer Headings, PMHNP   Subjective:   Patient ID:  Sheila James is a 45 y.o. (DOB May 15, 1976) female.  Chief Complaint:  Chief Complaint  Patient presents with   Anxiety   Other    PMDD    Anxiety    [name] presents for follow-up of anxiety, mood disturbance, PMDD, and insomnia.  She reports that "it has been a rough month." She reports that her menstrual cycles have been irregular with 2 cycles in October with less than 2 weeks apart. She reports that in November "I was a crying mess" around her menstrual cycle. She reports some situational stress that has also interfered with her self-care and rest, which led to worsening PMDD s/s. She reports fatigue. She reports periods of insomnia alternating with periods of hypersomnia. She reports difficulty getting out of bed when she has PMDD. She has had increased HR at times- "so loud I could hear it in my ears" due to anxiety. She reports that  concentration took considerable effort. She reports that she had frequent crying episodes in November and this interfered with being able to do her job and talk with clients.  She reports that she ran out of Hart for about 3 weeks. She feels that it was "definitely helping" with mood and anxiety. She reports that she has not been able to take the 2 mg dose due to awaiting insurance approval and other issues. Reports that refill for Norethindrone was denied and she was without it for a few weeks and had worsening PMDD s/s.  She reports that she had passive death wishes yesterday- "I feel like I am fighting a battle that I can't win, no matter what I do." Denies SI.   Still considering PHP/IOP programs.   Past Psychiatric Medication Trials: Wellbutrin- allergic reaction. Sertraline Prozac Buspar Lamictal- Some improvement in depressive s/s outside of pre-menstrual time Latuda- Caused increased glucose and cholesterol levels.  Abilify- helpful for depression. Wt. Gain.  Risperdal Geodon Adderall XR Vyvanse Concerta- Ineffective (short duration and minimal improvement) Melatonin- Ineffective Trazodone Doxepin- not helpful for sleep initiation  Review of Systems:  Review of Systems  Musculoskeletal:  Negative for gait problem.  Neurological:  Negative for tremors.  Psychiatric/Behavioral:         Please refer to HPI   Medications: I have reviewed the patient's current medications.  Current Outpatient Medications  Medication Sig Dispense Refill   norethindrone (MICRONOR) 0.35 MG tablet Take 1 tablet by mouth daily.     ALBUTEROL SULFATE IN Inhale into  the lungs.     amoxicillin-clavulanate (AUGMENTIN) 875-125 MG tablet Take 1 tablet by mouth 2 (two) times daily.     atorvastatin (LIPITOR) 20 MG tablet Take 20 mg by mouth daily. (Patient not taking: No sig reported)     B Complex Vitamins (VITAMIN-B COMPLEX PO) Take by mouth.     benzonatate (TESSALON) 100 MG capsule Take 100-200  mg by mouth every 8 (eight) hours as needed.     brexpiprazole (REXULTI) 2 MG TABS tablet Take 1 tablet (2 mg total) by mouth daily. (Patient not taking: Reported on 06/15/2021) 30 tablet 1   ferrous sulfate 325 (65 FE) MG tablet Take 325 mg by mouth daily with breakfast. (Patient not taking: Reported on 04/08/2021)     ketorolac (TORADOL) 10 MG tablet Take 10 mg by mouth every 6 (six) hours as needed. (Patient not taking: Reported on 04/08/2021)     lamoTRIgine (LAMICTAL) 200 MG tablet Take 1.5 tablets (300 mg total) by mouth daily. 45 tablet 2   [START ON 06/25/2021] lisdexamfetamine (VYVANSE) 70 MG capsule Take 1 capsule (70 mg total) by mouth daily. 30 capsule 0   lisdexamfetamine (VYVANSE) 70 MG capsule Take 1 capsule (70 mg total) by mouth daily. 30 capsule 0   [START ON 07/23/2021] lisdexamfetamine (VYVANSE) 70 MG capsule Take 1 capsule (70 mg total) by mouth daily. 30 capsule 0   LYSINE PO Take by mouth.     metFORMIN (GLUCOPHAGE-XR) 500 MG 24 hr tablet Take 2 tablets (1,000 mg total) by mouth every morning. 180 tablet 3   modafinil (PROVIGIL) 200 MG tablet Take 1 tablet (200 mg total) by mouth 2 (two) times daily. 30 tablet 2   Multiple Minerals-Vitamins (CALCIUM-MAGNESIUM-ZINC-D3 PO) Take by mouth. (Patient not taking: Reported on 04/08/2021)     Multiple Vitamin (MULTIVITAMIN) tablet Take 1 tablet by mouth daily. (Patient not taking: Reported on 01/25/2021)     nabumetone (RELAFEN) 750 MG tablet Take 750 mg by mouth daily as needed.      Omega-3 Fatty Acids (OMEGA 3 PO) Take by mouth.     promethazine-dextromethorphan (PROMETHAZINE-DM) 6.25-15 MG/5ML syrup SMARTSIG:5-10 Milliliter(s) By Mouth Every 8 Hours PRN     traZODone (DESYREL) 100 MG tablet Take 2 tablets (200 mg total) by mouth at bedtime. 180 tablet 0   vitamin E 100 UNIT capsule Take 100 Units by mouth daily.     No current facility-administered medications for this visit.    Medication Side Effects: None  Allergies:   Allergies  Allergen Reactions   Wellbutrin Xl [Bupropion] Hives    Past Medical History:  Diagnosis Date   Asthma    Bursitis    Diabetes mellitus, type II (HCC)    Elevated cholesterol    Fatigue    H/O: depression    Migraines    Pica     Family History  Problem Relation Age of Onset   Diabetes Mother    Anxiety disorder Mother    Diabetes Father    Hypertension Father    Asthma Brother    ADD / ADHD Brother    Diabetes Brother    Anxiety disorder Maternal Aunt    Anxiety disorder Maternal Aunt    ADD / ADHD Son     Social History   Socioeconomic History   Marital status: Divorced    Spouse name: Not on file   Number of children: 2   Years of education: Not on file   Highest education level: Bachelor's degree (e.g.,  BA, AB, BS)  Occupational History   Occupation: customer service  Tobacco Use   Smoking status: Never   Smokeless tobacco: Never  Substance and Sexual Activity   Alcohol use: No   Drug use: Never   Sexual activity: Not Currently    Partners: Male  Other Topics Concern   Not on file  Social History Narrative   Lives with children   Caffeine- none   Social Determinants of Health   Financial Resource Strain: Not on file  Food Insecurity: Not on file  Transportation Needs: Not on file  Physical Activity: Not on file  Stress: Not on file  Social Connections: Not on file  Intimate Partner Violence: Not on file    Past Medical History, Surgical history, Social history, and Family history were reviewed and updated as appropriate.   Please see review of systems for further details on the patient's review from today.   Objective:   Physical Exam:  There were no vitals taken for this visit.  Physical Exam Neurological:     Mental Status: She is alert and oriented to person, place, and time.     Cranial Nerves: No dysarthria.  Psychiatric:        Attention and Perception: Perception normal.        Mood and Affect: Mood is anxious.         Speech: Speech is rapid and pressured.        Behavior: Behavior is cooperative.        Thought Content: Thought content normal. Thought content is not paranoid or delusional. Thought content does not include homicidal or suicidal ideation. Thought content does not include homicidal or suicidal plan.        Cognition and Memory: Cognition and memory normal.        Judgment: Judgment normal.     Comments: Insight intact Dysphoric mood Impaired concentration and focus     Lab Review:  No results found for: NA, K, CL, CO2, GLUCOSE, BUN, CREATININE, CALCIUM, PROT, ALBUMIN, AST, ALT, ALKPHOS, BILITOT, GFRNONAA, GFRAA     Component Value Date/Time   WBC 12.8 (H) 05/30/2009 0528   RBC 2.82 (L) 05/30/2009 0528   HGB 8.2 DELTA CHECK NOTED (L) 05/30/2009 0528   HCT 24.3 (L) 05/30/2009 0528   PLT 188 05/30/2009 0528   MCV 86.3 05/30/2009 0528   MCHC 33.8 05/30/2009 0528   RDW 14.8 05/30/2009 0528    No results found for: POCLITH, LITHIUM   No results found for: PHENYTOIN, PHENOBARB, VALPROATE, CBMZ   .res Assessment: Plan:   Pt seen for 30 minutes and time spent discussing treatment plan. She reports that she was starting to notice some improvements in mood s/s with Rexulti and then had lapse in treatment while awaiting prior authorization, pharmacy to fill, etc. Discussed re-starting Rexulti every other day for one week then daily since she reports that her prescription is now ready. Encouraged her to follow-up with obgyn to ensure continuation of Norethindrone and to discuss alternatives if Norethindrone is not fully effective.   Continue Lamictal 300 mg po qd for mood s/s since she reports improved mood s/s with re-initiation of Lamictal.  Continue Vyvanse 70 mg po q am for ADHD.  Continue Trazodone 200 mg po QHS for insomnia.  Recommend continuing therapy with Sammuel Cooper, LCSW.  Pt to follow-up with this provider in 6 weeks or sooner if clinically indicated.  Patient advised to  contact office with any questions, adverse effects, or acute  worsening in signs and symptoms.   Katiya was seen today for anxiety and other.  Diagnoses and all orders for this visit:  Attention deficit hyperactivity disorder (ADHD), combined type -     lisdexamfetamine (VYVANSE) 70 MG capsule; Take 1 capsule (70 mg total) by mouth daily.  Insomnia, unspecified type -     traZODone (DESYREL) 100 MG tablet; Take 2 tablets (200 mg total) by mouth at bedtime.    Please see After Visit Summary for patient specific instructions.  Future Appointments  Date Time Provider [place]  07/08/2021  9:00 AM Barnie Del, LCSW [facility] None  07/22/2021 10:00 AM Barnie Del, LCSW [facility] None  07/29/2021  1:00 PM Thayer Headings, PMHNP [facility] None  08/05/2021 10:00 AM Barnie Del, LCSW [facility] None  08/19/2021 10:00 AM Barnie Del, LCSW [facility] None    No orders of the defined types were placed in this encounter.     -------------------------------

## 2021-06-15 NOTE — Telephone Encounter (Signed)
Pt called and stated she was going to get Rexulti from the pharmacy with $15 copay, but now they say they don't have it in stock.  She wants to know if she can come to the office and get 1 box of 2mg  until the Rexulti is due back in.  Pls call her and let her know if and when she can come by to pick it up.  Next appt 1/26

## 2021-07-08 ENCOUNTER — Ambulatory Visit (INDEPENDENT_AMBULATORY_CARE_PROVIDER_SITE_OTHER): Payer: No Typology Code available for payment source | Admitting: Addiction (Substance Use Disorder)

## 2021-07-08 ENCOUNTER — Other Ambulatory Visit: Payer: Self-pay

## 2021-07-08 DIAGNOSIS — F3281 Premenstrual dysphoric disorder: Secondary | ICD-10-CM | POA: Diagnosis not present

## 2021-07-08 DIAGNOSIS — F0634 Mood disorder due to known physiological condition with mixed features: Secondary | ICD-10-CM

## 2021-07-08 NOTE — Progress Notes (Signed)
°      [facility] Counselor/Therapist Progress Note  Patient ID: Sheila James, MRN: [id number],    Date: 07/08/2021  Time Spent: 8mins  Treatment Type: Individual Therapy  Reported Symptoms: emotional, low motivation, depression  Mental Status Exam:  Appearance:   Casual and Well Groomed     Behavior:  Appropriate and Sharing  Motor:  Normal   Speech/Language:   Clear and Coherent  Affect:  Appropriate and Congruent  Mood:  depressed, labile, and sad   Thought process:  normal    Thought content:    Obsessions and Rumination  Sensory/Perceptual disturbances:    WNL  Orientation:  x4  Attention:  Fair  Concentration:  Fair  Memory:  WNL  Fund of knowledge:   Good  Insight:    Fair  Judgment:   Fair  Impulse Control:  Fair   Risk Assessment: Danger to Self:  No Self-injurious Behavior: No Danger to Others: No Duty to Warn:no Physical Aggression / Violence:No  Access to Firearms a concern: No  Gang Involvement:No   Subjective: Client reported having ongoing severe PMDD symptoms. Client reported a lot of emotional mood swings, low motivation, & depression spells. Client in good standing with work but has been written up once. Client anxious about losing her job and not being able to pay her bills. Client shared her struggle with almost losing her housing and the trauma that followed; therapist used MI & CBT with client to validate the struggles she has been through and how it has affected her traumatically & help her process her emotions and thoughts about her health and personal life with her bf Kendrick. Therapist assessed for client safety and client denied SI/HI/AVH  Interventions: Cognitive Behavioral Therapy, Motivational Interviewing, and RPT  Diagnosis:   ICD-10-CM   1. PMDD (premenstrual dysphoric disorder)  F32.81     2. Mood disorder due to known physiological condition with mixed features  F06.34         Plan of Care:  Client to return for  weekly therapy with Sammuel Cooper, therapist, to review again in 6 months.  Client to engage in positive self talk and challenging negative internal ruminations and self talk causing client to be overly anxious and worried using CBT, on daily practice. Client to engage in mindfulness: ie body scans each eveneing to help process and discharge emotional distress & recognize emotions. Client to utilize BSP (brainspotting) with therapist to help client regulate their anxiety in a somatic- felt body sense way: (ie by working to reduce muscle tension, ruminations, increased heart rate, constant worrying and feeling "hyper" ) by decreasing anxiety by 33% in the next 6 months.  Client to prioritize sleep 8+ hours each week night AEB going to bed by 10pm each night.    Barnie Del, LCSW, LCAS, CCTP, CCS-I, BSP

## 2021-07-22 ENCOUNTER — Telehealth: Payer: Self-pay | Admitting: Psychiatry

## 2021-07-22 ENCOUNTER — Ambulatory Visit (INDEPENDENT_AMBULATORY_CARE_PROVIDER_SITE_OTHER): Payer: No Typology Code available for payment source | Admitting: Addiction (Substance Use Disorder)

## 2021-07-22 DIAGNOSIS — F3281 Premenstrual dysphoric disorder: Secondary | ICD-10-CM

## 2021-07-22 DIAGNOSIS — F0634 Mood disorder due to known physiological condition with mixed features: Secondary | ICD-10-CM | POA: Diagnosis not present

## 2021-07-22 NOTE — Progress Notes (Addendum)
[facility] Counselor/Therapist Progress Note  Patient ID: MILAINA SHER, MRN: [id number],    Date: 07/22/2021  Time Spent: 56 mins  Treatment Type: Individual Therapy  Reported Symptoms: hopeless, depressed  Mental Status Exam:  Appearance:   Casual and Well Groomed     Behavior:  Appropriate and Sharing  Motor:  Normal   Speech/Language:   Clear and Coherent  Affect:  Appropriate and Congruent  Mood:  depressed, labile, and sad   Thought process:  normal    Thought content:    Obsessions and Rumination  Sensory/Perceptual disturbances:    WNL  Orientation:  x4  Attention:  Fair  Concentration:  Fair  Memory:  WNL  Fund of knowledge:   Good  Insight:    Fair  Judgment:   Fair  Impulse Control:  Fair   Risk Assessment: Danger to Self:  Yes.  without intent/plan Self-injurious Behavior: No Danger to Others: No Duty to Warn:no Physical Aggression / Violence:No  Access to Firearms a concern: No  Gang Involvement:No   Virtual Visit via VIDEO: I connected with client by MyChart video enabled telemedicine/telehealth application, with their informed consent, and verified client privacy and that I am speaking with the correct person using two identifiers. I discussed the limitations, risks, security and privacy concerns of performing psychotherapy and management service virtually and confirmed their location. I also discussed with the patient that there may be a patient responsible charge related to this service and to confirm with the front desk if their insurance covers teletherapy. I also discussed with the patient the availability of in person appointments. The patient expressed understanding and agreed to proceed. I discussed the treatment planning with the client. The client was provided an opportunity to ask questions and all were answered. The client agreed with the plan and demonstrated an understanding of the instructions. The client was advised to call our  office if symptoms worsen or feel they are in a crisis state and need immediate contact. Client also reminded of a crisis line number and to use 9-1-1 if there's an emergency.  Therapist Location: office; Client Location: home.   Subjective: Client reported the extreme stress level at work due to her intense depression and spiraling moods. Client expressed symptoms like cussing out her bf, yelling at her kids, feeling hopeless, feeing unable to take care of her hygiene etc, and feeling anhedonia or defeated about her inability to do her work/job well. Client reported having a dr appt next week to discus her need for more meds for stabilization. Client reported being in corrective actions at work due to lack of attendance and said "she was not doing her job". Client reported feeling "like she cant win", and therapist used MI & Cbt to validate her struggle with depression and help support her as she processes her thoughts about her PMDD & depression/ mood disorder worsening. Therapist also used RPT with client to help her set realistic basic SMART goals for self care. Client unable to tolerate trying to help everyone when she cant fully care for herself. Therapist assessed for client safety and client denied HI/AVH and reported having a lot of thoughts about dying but no plan/intent/means etc. Therapist reviewed details of options for client to seek safety if the thoughts of suicide come back: ie go to the hospital or [facility] urgent care or call a crisis line. Therapist also consulted with client's doctor letting her know about clients' status prior to her  dr's appt.   Interventions: Cognitive Behavioral Therapy, Motivational Interviewing, and RPT  & safety planning  Diagnosis:   ICD-10-CM   1. PMDD (premenstrual dysphoric disorder)  F32.81     2. Mood disorder due to known physiological condition with mixed features  F06.34         Plan of Care:  Client to return for weekly  therapy with Sammuel Cooper, therapist, to review again in 6 months.  Client to engage in positive self talk and challenging negative internal ruminations and self talk causing client to be overly anxious and worried using CBT, on daily practice. Client to engage in mindfulness: ie body scans each eveneing to help process and discharge emotional distress & recognize emotions. Client to utilize BSP (brainspotting) with therapist to help client regulate their anxiety in a somatic- felt body sense way: (ie by working to reduce muscle tension, ruminations, increased heart rate, constant worrying and feeling "hyper" ) by decreasing anxiety by 33% in the next 6 months.  Client to prioritize sleep 8+ hours each week night AEB going to bed by 10pm each night.    Barnie Del, LCSW, LCAS, CCTP, CCS-I, BSP

## 2021-07-23 ENCOUNTER — Encounter: Payer: Self-pay | Admitting: Psychiatry

## 2021-07-23 ENCOUNTER — Ambulatory Visit (INDEPENDENT_AMBULATORY_CARE_PROVIDER_SITE_OTHER): Payer: No Typology Code available for payment source | Admitting: Psychiatry

## 2021-07-23 ENCOUNTER — Other Ambulatory Visit: Payer: Self-pay

## 2021-07-23 DIAGNOSIS — F3281 Premenstrual dysphoric disorder: Secondary | ICD-10-CM | POA: Diagnosis not present

## 2021-07-23 DIAGNOSIS — F0634 Mood disorder due to known physiological condition with mixed features: Secondary | ICD-10-CM

## 2021-07-23 DIAGNOSIS — G4700 Insomnia, unspecified: Secondary | ICD-10-CM | POA: Diagnosis not present

## 2021-07-23 DIAGNOSIS — R5383 Other fatigue: Secondary | ICD-10-CM | POA: Diagnosis not present

## 2021-07-23 MED ORDER — LITHIUM CARBONATE 150 MG PO CAPS: ORAL_CAPSULE | ORAL | 1 refills | Status: DC

## 2021-07-23 MED ORDER — MODAFINIL 200 MG PO TABS: ORAL_TABLET | ORAL | 1 refills | Status: DC

## 2021-07-23 MED ORDER — BREXPIPRAZOLE 2 MG PO TABS: 2.0000 mg | ORAL_TABLET | Freq: Every day | ORAL | 1 refills | Status: DC

## 2021-07-23 MED ORDER — TRAZODONE HCL 100 MG PO TABS: 200.0000 mg | ORAL_TABLET | Freq: Every day | ORAL | 0 refills | Status: DC

## 2021-07-23 MED ORDER — LAMOTRIGINE 200 MG PO TABS: 300.0000 mg | ORAL_TABLET | Freq: Every day | ORAL | 2 refills | Status: DC

## 2021-07-23 NOTE — Progress Notes (Signed)
Sheila James [id number] 03-Feb-1976 46 y.o.  Subjective:   Patient ID:  Sheila James is a 46 y.o. (DOB 1975/11/08) female.  Chief Complaint:  Chief Complaint  Patient presents with   Depression    Depression       Sheila James presents to the office today for follow-up of anxiety, depression, insomnia, and PMDD. She reports that she is having more depression and it is now occurring at different times throughout the month. She reports that she has been crying while working. She has increased irritability and has been "blowing up" at others at times. She reports that she has been more withdrawn and not be around people. She reports feeling like, "I am not moving forward." She reports that she has been falling asleep during the day. She reports extreme fatigue and low energy. She reports that she has been having early morning awakenings and getting 4-6 hours a night. Son has been disrupting her sleep with getting up during the night. She reports difficulty with concentration. She reports that pharmacy did not have Vyvanse for 7 days and she has increased appetite, "brain fog," and poor concentration. She has had intermittent suicidal thoughts. Denies SI at time of exam. Contracts for safety.   She reports that she has anxiety. 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   She reports that other tools she uses to improve mood are minimally effective for PMDD.   Past Psychiatric Medication Trials: Wellbutrin- allergic reaction. Sertraline Prozac Buspar Lamictal- Some improvement in depressive s/s outside of pre-menstrual time Latuda- Caused increased glucose and cholesterol levels.  Abilify- helpful for depression. Wt. Gain.  Risperdal Geodon Adderall XR Vyvanse Concerta- Ineffective (short duration and minimal improvement) Modafinil Melatonin- Ineffective Trazodone Doxepin- not helpful for sleep  initiation    [place] Office Visit from 07/23/2021 in [facility] [employer] Visit from 08/25/2020 in [facility] Visit from 11/13/2019 in [facility] Total Score 0 0 0      PHQ2-9    Flowsheet Row Nutrition [facility]16/[facility]  PHQ-2 Total Score 0        Review of Systems:  Review of Systems  Endocrine:       She reports that she recently saw gynecologist about options for PMDD. She reports that they going to try to get treatment option approved to stop menstrual cycles.   Musculoskeletal:  Negative for gait problem.  Neurological:  Negative for tremors.  Psychiatric/Behavioral:  Positive for depression.        Please refer to HPI   Medications: I have reviewed the patient's current medications.  Current Outpatient Medications  Medication Sig Dispense Refill   ALBUTEROL SULFATE IN Inhale into the lungs.     ferrous sulfate 325 (65 FE) MG tablet Take 325 mg by mouth daily with breakfast.     lisdexamfetamine (VYVANSE) 70 MG capsule Take 1 capsule (70 mg total) by mouth daily. 30 capsule 0   lithium carbonate 150 MG capsule Take 1 capsule po QHS x 3-5 days, then increase to 2 capsules po QHS 60 capsule 1   LYSINE PO Take by mouth.     metFORMIN (GLUCOPHAGE-XR) 500 MG 24 hr tablet Take 2 tablets (1,000 mg total) by mouth every morning. 180 tablet 3   Multiple Vitamins-Minerals (MULTIVITAMIN WOMEN PO) Take by mouth.     nabumetone (RELAFEN)  750 MG tablet Take 750 mg by mouth daily as needed.      norethindrone (MICRONOR) 0.35 MG tablet Take 1 tablet by mouth daily.     vitamin E 100 UNIT capsule Take 100 Units by mouth daily.     atorvastatin (LIPITOR) 20 MG tablet Take 20 mg by mouth daily. (Patient not taking: Reported on 08/25/2020)     B Complex Vitamins (VITAMIN-B COMPLEX PO) Take by mouth. (Patient not taking: Reported on 07/23/2021)     brexpiprazole (REXULTI) 2  MG TABS tablet Take 1 tablet (2 mg total) by mouth daily. 30 tablet 1   ketorolac (TORADOL) 10 MG tablet Take 10 mg by mouth every 6 (six) hours as needed. (Patient not taking: Reported on 04/08/2021)     lamoTRIgine (LAMICTAL) 200 MG tablet Take 1.5 tablets (300 mg total) by mouth daily. 45 tablet 2   lisdexamfetamine (VYVANSE) 70 MG capsule Take 1 capsule (70 mg total) by mouth daily. 30 capsule 0   lisdexamfetamine (VYVANSE) 70 MG capsule Take 1 capsule (70 mg total) by mouth daily. 30 capsule 0   modafinil (PROVIGIL) 200 MG tablet Take 1 tablet in the morning and mid-day 60 tablet 1   Multiple Minerals-Vitamins (CALCIUM-MAGNESIUM-ZINC-D3 PO) Take by mouth. (Patient not taking: Reported on 04/08/2021)     Multiple Vitamin (MULTIVITAMIN) tablet Take 1 tablet by mouth daily. (Patient not taking: Reported on 01/25/2021)     Omega-3 Fatty Acids (OMEGA 3 PO) Take by mouth. (Patient not taking: Reported on 07/23/2021)     traZODone (DESYREL) 100 MG tablet Take 2 tablets (200 mg total) by mouth at bedtime. 180 tablet 0   No current facility-administered medications for this visit.    Medication Side Effects: Other: Tremors a few weeks ago  Allergies:  Allergies  Allergen Reactions   Wellbutrin Xl [Bupropion] Hives    Past Medical History:  Diagnosis Date   Asthma    Bursitis    Diabetes mellitus, type II ([place])    Elevated cholesterol    Fatigue    H/O: depression    Migraines    Pica     Past Medical History, Surgical history, Social history, and Family history were reviewed and updated as appropriate.   Please see review of systems for further details on the patient's review from today.   Objective:   Physical Exam:  BP 128/77    Pulse 86   Physical Exam Constitutional:      General: She is not in acute distress. Musculoskeletal:        General: No deformity.  Neurological:     Mental Status: She is alert and oriented to person, place, and time.     Coordination:  Coordination normal.  Psychiatric:        Attention and Perception: Attention and perception normal. She does not perceive auditory or visual hallucinations.        Mood and Affect: Mood is anxious and depressed. Affect is not labile, blunt, angry or inappropriate.        Speech: Speech normal.        Behavior: Behavior normal.        Thought Content: Thought content normal. Thought content is not paranoid or delusional. Thought content does not include homicidal or suicidal ideation. Thought content does not include homicidal or suicidal plan.        Cognition and Memory: Cognition and memory normal.        Judgment: Judgment normal.     Comments:  Insight intact    Lab Review:  No results found for: NA, K, CL, CO2, GLUCOSE, BUN, CREATININE, CALCIUM, PROT, ALBUMIN, AST, ALT, ALKPHOS, BILITOT, GFRNONAA, GFRAA     Component Value Date/Time   WBC 12.8 (H) 05/30/2009 0528   RBC 2.82 (L) 05/30/2009 0528   HGB 8.2 DELTA CHECK NOTED (L) 05/30/2009 0528   HCT 24.3 (L) 05/30/2009 0528   PLT 188 05/30/2009 0528   MCV 86.3 05/30/2009 0528   MCHC 33.8 05/30/2009 0528   RDW 14.8 05/30/2009 0528    No results found for: POCLITH, LITHIUM   No results found for: PHENYTOIN, PHENOBARB, VALPROATE, CBMZ   .res Assessment: Plan:   Pt seen for 30 minutes and time spent discussing possible treatment options and recommending leave of absence from work through 08/22/21 with tentative return to work date of 08/23/21.  Counseled patient regarding the use of lithium and low doses to prevent suicidality. Discussed that side effect risk is typically minimal with very low doses used to treat chronic suicidal thoughts. Discussed that Lithium may also be helpful for depression. Pt agrees to trial of Lithium. Will start Lithium 150 mg po QHS for 3-5 days, then increase to 300 mg po QHS.  Will increase Modafinil to 200 mg in the morning and 200 mg in the middle of the day.  Continue Rexulti 2 mg po qd for mood s/s.   Continue Lamictal 300 mg po qd for mood s/s.  Continue Vyvanse 70 mg po qd for ADHD.  Continue Trazodone 200 mg po QHS for insomnia.  Recommend continuing therapy with Sammuel Cooper, LCSW. Pt to follow-up with this provider on 08/12/21 or sooner if clinically indicated.  Patient advised to contact office with any questions, adverse effects, or acute worsening in signs and symptoms. Reviewed safety plan and pt has information for [facility] [place].    Sheila James was seen today for depression.  Diagnoses and all orders for this visit:  Mood disorder due to known physiological condition with mixed features -     lamoTRIgine (LAMICTAL) 200 MG tablet; Take 1.5 tablets (300 mg total) by mouth daily. -     lithium carbonate 150 MG capsule; Take 1 capsule po QHS x 3-5 days, then increase to 2 capsules po QHS -     brexpiprazole (REXULTI) 2 MG TABS tablet; Take 1 tablet (2 mg total) by mouth daily.  PMDD (premenstrual dysphoric disorder) -     lamoTRIgine (LAMICTAL) 200 MG tablet; Take 1.5 tablets (300 mg total) by mouth daily. -     lithium carbonate 150 MG capsule; Take 1 capsule po QHS x 3-5 days, then increase to 2 capsules po QHS -     brexpiprazole (REXULTI) 2 MG TABS tablet; Take 1 tablet (2 mg total) by mouth daily.  Insomnia, unspecified type -     traZODone (DESYREL) 100 MG tablet; Take 2 tablets (200 mg total) by mouth at bedtime.  Fatigue, unspecified type -     modafinil (PROVIGIL) 200 MG tablet; Take 1 tablet in the morning and mid-day     Please see After Visit Summary for patient specific instructions.  Future Appointments  Date Time Provider [place]  07/29/2021  2:00 PM Barnie Del, [place] CP-CP None  08/05/2021 10:00 AM Barnie Del, LCSW CP-CP None  08/12/2021 11:45 AM Thayer Headings, PMHNP CP-CP None  08/19/2021 10:00 AM Barnie Del, LCSW CP-CP None  09/30/2021  3:00 PM Barnie Del, LCSW CP-CP None  10/14/2021  1:00 PM Barnie Del, LCSW CP-CP None  10/28/2021   1:00 PM Barnie Del, LCSW CP-CP None    No orders of the defined types were placed in this encounter.   -------------------------------

## 2021-07-26 NOTE — Telephone Encounter (Signed)
Pt seen 07/23/21

## 2021-07-29 ENCOUNTER — Ambulatory Visit: Payer: No Typology Code available for payment source | Admitting: Addiction (Substance Use Disorder)

## 2021-07-29 ENCOUNTER — Ambulatory Visit: Payer: No Typology Code available for payment source | Admitting: Psychiatry

## 2021-08-05 ENCOUNTER — Ambulatory Visit (INDEPENDENT_AMBULATORY_CARE_PROVIDER_SITE_OTHER): Payer: No Typology Code available for payment source | Admitting: Addiction (Substance Use Disorder)

## 2021-08-05 ENCOUNTER — Telehealth: Payer: Self-pay

## 2021-08-05 DIAGNOSIS — F0634 Mood disorder due to known physiological condition with mixed features: Secondary | ICD-10-CM

## 2021-08-05 NOTE — Progress Notes (Signed)
[facility] Counselor/Therapist Progress Note  Patient ID: Sheila James, MRN: [id number],    Date: 08/05/2021  Time Spent: 57 mins  Treatment Type: Individual Therapy  Reported Symptoms: depressed, mood swings, low motivation  Mental Status Exam:  Appearance:   Casual and Well Groomed     Behavior:  Appropriate and Sharing  Motor:  Normal   Speech/Language:   Clear and Coherent  Affect:  Appropriate and Congruent  Mood:  depressed, labile, and sad   Thought process:  normal    Thought content:    Obsessions and Rumination  Sensory/Perceptual disturbances:    WNL  Orientation:  x4  Attention:  Fair  Concentration:  Fair  Memory:  WNL  Fund of knowledge:   Good  Insight:    Fair  Judgment:   Fair  Impulse Control:  Fair   Risk Assessment: Danger to Self:  Yes.  without intent/plan Self-injurious Behavior: No Danger to Others: No Duty to Warn:no Physical Aggression / Violence:No  Access to Firearms a concern: No  Gang Involvement:No   Virtual Visit via VIDEO: I connected with client by MyChart video enabled telemedicine/telehealth application, with their informed consent, and verified client privacy and that I am speaking with the correct person using two identifiers. I discussed the limitations, risks, security and privacy concerns of performing psychotherapy and management service virtually and confirmed their location. I also discussed with the patient that there may be a patient responsible charge related to this service and to confirm with the front desk if their insurance covers teletherapy. I also discussed with the patient the availability of in person appointments. The patient expressed understanding and agreed to proceed. I discussed the treatment planning with the client. The client was provided an opportunity to ask questions and all were answered. The client agreed with the plan and demonstrated an understanding of the instructions. The client was  advised to call our office if symptoms worsen or feel they are in a crisis state and need immediate contact. Client also reminded of a crisis line number and to use 9-1-1 if there's an emergency.  Therapist Location: office; Client Location: home.   Subjective: Client expressed her lack of motivation and lack of ability to keep her thoughts clear enough to work full time. Client expressed her fear she would break at some moment in the future if she were to have to go back to work during this next few months. Client afraid her mood swings wont slow down if she cant get menopausal meds approved. Client feeling despair about the insurance not approving her for the medication to help her manage her moods. Therapist used CBT & MI to support client in validation and to help her process her fears and challenge irrational thoughts Therapist assessed for client safety and client denied HI/AVH and denied thoughts of dying. Therapist reviewed client safety plan to go to [facility] urgent care or call a crisis line or go to ER if client needs.   Interventions: Cognitive Behavioral Therapy, Motivational Interviewing, and RPT  & safety planning  Diagnosis:   ICD-10-CM   1. Mood disorder due to known physiological condition with mixed features  F06.34        Plan of Care:  Client to return for weekly therapy with Sammuel Cooper, therapist, to review again in 6 months.  Client to engage in positive self talk and challenging negative internal ruminations and self talk causing client to be overly anxious and  worried using CBT, on daily practice. Client to engage in mindfulness: ie body scans each eveneing to help process and discharge emotional distress & recognize emotions. Client to utilize BSP (brainspotting) with therapist to help client regulate their anxiety in a somatic- felt body sense way: (ie by working to reduce muscle tension, ruminations, increased heart rate, constant worrying and  feeling "hyper" ) by decreasing anxiety by 33% in the next 6 months.  Client to prioritize sleep 8+ hours each week night AEB going to bed by 10pm each night.    Barnie Del, LCSW, LCAS, CCTP, CCS-I, BSP

## 2021-08-05 NOTE — Telephone Encounter (Signed)
Received fax from [place] regarding Sheila James. Completion needed for FMLA form. Placed in Traci's box.

## 2021-08-11 ENCOUNTER — Ambulatory Visit (INDEPENDENT_AMBULATORY_CARE_PROVIDER_SITE_OTHER): Payer: No Typology Code available for payment source | Admitting: Addiction (Substance Use Disorder)

## 2021-08-11 ENCOUNTER — Ambulatory Visit: Payer: No Typology Code available for payment source | Admitting: Addiction (Substance Use Disorder)

## 2021-08-11 DIAGNOSIS — F0634 Mood disorder due to known physiological condition with mixed features: Secondary | ICD-10-CM

## 2021-08-11 NOTE — Progress Notes (Signed)
[facility] Counselor/Therapist Progress Note  Patient ID: MANUELA HALBUR, MRN: [id number],    Date: 08/11/2021  Time Spent: 26mins  Treatment Type: Individual Therapy  Reported Symptoms: anxious about homelessness possibility.   Mental Status Exam:  Appearance:   Casual and Well Groomed     Behavior:  Appropriate and Sharing  Motor:  Normal   Speech/Language:   Clear and Coherent  Affect:  Appropriate and Congruent  Mood:  depressed and labile   Thought process:  normal    Thought content:    Obsessions and Rumination  Sensory/Perceptual disturbances:    WNL  Orientation:  x4  Attention:  Fair  Concentration:  Fair  Memory:  WNL  Fund of knowledge:   Good  Insight:    Fair  Judgment:   Fair  Impulse Control:  Fair   Risk Assessment: Danger to Self:  Yes.  without intent/plan Self-injurious Behavior: No Danger to Others: No Duty to Warn:no Physical Aggression / Violence:No  Access to Firearms a concern: No  Gang Involvement:No   Virtual Visit via VIDEO: I connected with client by MyChart video enabled telemedicine/telehealth application, with their informed consent, and verified client privacy and that I am speaking with the correct person using two identifiers. I discussed the limitations, risks, security and privacy concerns of performing psychotherapy and management service virtually and confirmed their location. I also discussed with the patient that there may be a patient responsible charge related to this service and to confirm with the front desk if their insurance covers teletherapy. I also discussed with the patient the availability of in person appointments. The patient expressed understanding and agreed to proceed. I discussed the treatment planning with the client. The client was provided an opportunity to ask questions and all were answered. The client agreed with the plan and demonstrated an understanding of the instructions. The client was advised  to call our office if symptoms worsen or feel they are in a crisis state and need immediate contact. Client also reminded of a crisis line number and to use 9-1-1 if there's an emergency.  Therapist Location: office; Client Location: home.   Subjective: Client expressed her concerns with depression and PMDD symptoms continuing despite her meds. Client reported struggling to remember her meds but otherwise feeling they just dont help enough. Client considering having a hysterectomy to help reduce her PMDD symptoms and therapist used MI  & SFT with client to support her in processing her consideration and the pros and cons of each path. Client working on making a decision so she can move forward with her doctor. Therapist assessed for client stability and struggles with self care and work. Client reported still struggling not to sleep a lot and having no ability to self motivate for anything. Client denied SI/HI/AVH and agreed to go to the ER if she didn't feel safe or had SI thoughts.   Interventions: Cognitive Behavioral Therapy, Motivational Interviewing, Solution-Oriented/Positive Psychology, and RPT  & safety planning  Diagnosis:   ICD-10-CM   1. Mood disorder due to known physiological condition with mixed features  F06.34        Plan of Care:  Client to return for weekly therapy with Sammuel Cooper, therapist, to review again in 6 months.  Client to engage in positive self talk and challenging negative internal ruminations and self talk causing client to be overly anxious and worried using CBT, on daily practice. Client to engage in mindfulness: ie body scans each eveneing  to help process and discharge emotional distress & recognize emotions. Client to utilize BSP (brainspotting) with therapist to help client regulate their anxiety in a somatic- felt body sense way: (ie by working to reduce muscle tension, ruminations, increased heart rate, constant worrying and feeling "hyper" ) by decreasing  anxiety by 33% in the next 6 months.  Client to prioritize sleep 8+ hours each week night AEB going to bed by 10pm each night.    Barnie Del, LCSW, LCAS, CCTP, CCS-I, BSP

## 2021-08-12 ENCOUNTER — Encounter: Payer: Self-pay | Admitting: Psychiatry

## 2021-08-12 ENCOUNTER — Telehealth (INDEPENDENT_AMBULATORY_CARE_PROVIDER_SITE_OTHER): Payer: No Typology Code available for payment source | Admitting: Psychiatry

## 2021-08-12 DIAGNOSIS — F0634 Mood disorder due to known physiological condition with mixed features: Secondary | ICD-10-CM | POA: Diagnosis not present

## 2021-08-12 DIAGNOSIS — F3281 Premenstrual dysphoric disorder: Secondary | ICD-10-CM

## 2021-08-12 MED ORDER — LAMOTRIGINE 200 MG PO TABS: 200.0000 mg | ORAL_TABLET | Freq: Every day | ORAL | 2 refills | Status: DC

## 2021-08-12 NOTE — Progress Notes (Signed)
TINEY ZIPPER [id number] Jan 24, 1976 46 y.o.  Virtual Visit via Video Note  I connected with pt @ on 08/12/21 at 11:45 AM EST by a video enabled telemedicine application and verified that I am speaking with the correct person using two identifiers.   I discussed the limitations of evaluation and management by telemedicine and the availability of in person appointments. The patient expressed understanding and agreed to proceed.  I discussed the assessment and treatment plan with the patient. The patient was provided an opportunity to ask questions and all were answered. The patient agreed with the plan and demonstrated an understanding of the instructions.   The patient was advised to call back or seek an in-person evaluation if the symptoms worsen or if the condition fails to improve as anticipated.  I provided 30 minutes of non-face-to-face time during this encounter.  The patient was located at home.  The provider was located at [place].   Thayer Headings, PMHNP   Subjective:   Patient ID:  Sheila James is a 46 y.o. (DOB August 12, 1975) female.  Chief Complaint:  Chief Complaint  Patient presents with   Depression   Anxiety   ADHD   Other    PMDD    HPI Sheila James presents for follow-up of mood disturbance, anxiety, insomnia, PMDD, and ADHD.  She reports, "I'm better today than I have been in the last 6-8 weeks." She reports she initially was not sleeping enough and then was sleeping too much. She reports that she felt depressed this weekend and did not want to get out of bed and had severely low energy and motivation. She reports that she was also sleeping excessively over the weekend. She reports that she had similar symptoms on Tuesday and "took hours to get me to the point where I could function." She reports that she has low interest in things. She reports "the crying has decreased. Appetite has been "voracious" and is craving carbs. She  reports weight gain. She reports "brain fog" and her son commented she is forgetting things. She reports difficulty recalling names and certain words. Reports that she has to take notes to recall therapist's recommendation. She reports that increase in modafinil has not been helpful and she continues to have severe fatigue and somnolence at times. Lost notebook where she would write all her reminders.   She reports that she had some heart palpitations in response to a fear. She reports some worry and anxiety. She reports that she is fearful that she will miss work due to symptoms. She reports that she has catastrophic thoughts about being homeless again- "that's my biggest fear... that was traumatic."   She reports that she has not had thoughts of harming herself in the last 7 days. Denies SI.  She reports that she misplaced Lithium. She reports that she may have taken it once.   She reports that PMDD s/s persisted after the onset of menses. She reports that she had uncontrolled crying around last menstrual cycle. She reports that she had insomnia with last menstrual cycle. "Emotionally I was a wreck." She reports that Lupron injection was denied. She is contemplating hysterectomy to relieve PMDD s/s.   Past Psychiatric Medication Trials: Wellbutrin- allergic reaction. Sertraline Prozac Buspar Lamictal- Some improvement in depressive s/s outside of pre-menstrual time Latuda- Caused increased glucose and cholesterol levels.  Abilify- helpful for depression. Wt. Gain.  Risperdal Geodon Adderall XR Vyvanse Concerta- Ineffective (short duration and minimal improvement) Modafinil Melatonin- Ineffective Trazodone Doxepin-  not helpful for sleep initiation    Review of Systems:  Review of Systems  Constitutional:  Positive for fatigue.  Musculoskeletal:  Negative for gait problem.  Neurological:  Negative for tremors.  Psychiatric/Behavioral:         Please refer to HPI   She reports  average resting HR has been 75-78.   Medications: I have reviewed the patient's current medications.  Current Outpatient Medications  Medication Sig Dispense Refill   brexpiprazole (REXULTI) 2 MG TABS tablet Take 1 tablet (2 mg total) by mouth daily. 30 tablet 1   ferrous sulfate 325 (65 FE) MG tablet Take 325 mg by mouth daily with breakfast.     lisdexamfetamine (VYVANSE) 70 MG capsule Take 1 capsule (70 mg total) by mouth daily. 30 capsule 0   LYSINE PO Take by mouth.     metFORMIN (GLUCOPHAGE-XR) 500 MG 24 hr tablet Take 2 tablets (1,000 mg total) by mouth every morning. 180 tablet 3   modafinil (PROVIGIL) 200 MG tablet Take 1 tablet in the morning and mid-day 60 tablet 1   Multiple Vitamins-Minerals (MULTIVITAMIN WOMEN PO) Take by mouth.     norethindrone (MICRONOR) 0.35 MG tablet Take 1 tablet by mouth daily.     traZODone (DESYREL) 100 MG tablet Take 2 tablets (200 mg total) by mouth at bedtime. (Patient taking differently: Take 200 mg by mouth at bedtime as needed.) 180 tablet 0   vitamin E 100 UNIT capsule Take 100 Units by mouth daily.     ALBUTEROL SULFATE IN Inhale into the lungs.     atorvastatin (LIPITOR) 20 MG tablet Take 20 mg by mouth daily. (Patient not taking: Reported on 08/25/2020)     B Complex Vitamins (VITAMIN-B COMPLEX PO) Take by mouth. (Patient not taking: Reported on 07/23/2021)     ketorolac (TORADOL) 10 MG tablet Take 10 mg by mouth every 6 (six) hours as needed. (Patient not taking: Reported on 04/08/2021)     lamoTRIgine (LAMICTAL) 200 MG tablet Take 1 tablet (200 mg total) by mouth daily. 45 tablet 2   lisdexamfetamine (VYVANSE) 70 MG capsule Take 1 capsule (70 mg total) by mouth daily. 30 capsule 0   lisdexamfetamine (VYVANSE) 70 MG capsule Take 1 capsule (70 mg total) by mouth daily. 30 capsule 0   lithium carbonate 150 MG capsule Take 1 capsule po QHS x 3-5 days, then increase to 2 capsules po QHS (Patient not taking: Reported on 08/12/2021) 60 capsule 1    Multiple Minerals-Vitamins (CALCIUM-MAGNESIUM-ZINC-D3 PO) Take by mouth. (Patient not taking: Reported on 04/08/2021)     Multiple Vitamin (MULTIVITAMIN) tablet Take 1 tablet by mouth daily. (Patient not taking: Reported on 01/25/2021)     nabumetone (RELAFEN) 750 MG tablet Take 750 mg by mouth daily as needed.  (Patient not taking: Reported on 08/12/2021)     Omega-3 Fatty Acids (OMEGA 3 PO) Take by mouth. (Patient not taking: Reported on 07/23/2021)     No current facility-administered medications for this visit.    Medication Side Effects: Other: Memory difficulties   Occ involuntary movement in left arm.  Allergies:  Allergies  Allergen Reactions   Wellbutrin Xl [Bupropion] Hives    Past Medical History:  Diagnosis Date   Asthma    Bursitis    Diabetes mellitus, type II (HCC)    Elevated cholesterol    Fatigue    H/O: depression    Migraines    Pica     Family History  Problem Relation Age  of Onset   Diabetes Mother    Anxiety disorder Mother    Diabetes Father    Hypertension Father    Asthma Brother    ADD / ADHD Brother    Diabetes Brother    Anxiety disorder Maternal Aunt    Anxiety disorder Maternal Aunt    ADD / ADHD Son     Social History   Socioeconomic History   Marital status: Divorced    Spouse name: Not on file   Number of children: 2   Years of education: Not on file   Highest education level: Bachelor's degree (e.g., BA, AB, BS)  Occupational History   Occupation: customer service  Tobacco Use   Smoking status: Never   Smokeless tobacco: Never  Substance and Sexual Activity   Alcohol use: No   Drug use: Never   Sexual activity: Not Currently    Partners: Male  Other Topics Concern   Not on file  Social History Narrative   Lives with children   Caffeine- none   Social Determinants of Health   Financial Resource Strain: Not on file  Food Insecurity: Not on file  Transportation Needs: Not on file  Physical Activity: Not on file   Stress: Not on file  Social Connections: Not on file  Intimate Partner Violence: Not on file    Past Medical History, Surgical history, Social history, and Family history were reviewed and updated as appropriate.   Please see review of systems for further details on the patient's review from today.   Objective:   Physical Exam:  Pulse (!) 112   Physical Exam Neurological:     Mental Status: She is alert and oriented to person, place, and time.     Cranial Nerves: No dysarthria.  Psychiatric:        Attention and Perception: Attention and perception normal.        Mood and Affect: Mood is anxious and depressed.        Speech: Speech normal.        Behavior: Behavior is cooperative.        Thought Content: Thought content normal. Thought content is not paranoid or delusional. Thought content does not include homicidal or suicidal ideation. Thought content does not include homicidal or suicidal plan.        Cognition and Memory: Cognition normal. She exhibits impaired recent memory.        Judgment: Judgment normal.     Comments: Insight intact Impaired concentration    Lab Review:  No results found for: NA, K, CL, CO2, GLUCOSE, BUN, CREATININE, CALCIUM, PROT, ALBUMIN, AST, ALT, ALKPHOS, BILITOT, GFRNONAA, GFRAA     Component Value Date/Time   WBC 12.8 (H) 05/30/2009 0528   RBC 2.82 (L) 05/30/2009 0528   HGB 8.2 DELTA CHECK NOTED (L) 05/30/2009 0528   HCT 24.3 (L) 05/30/2009 0528   PLT 188 05/30/2009 0528   MCV 86.3 05/30/2009 0528   MCHC 33.8 05/30/2009 0528   RDW 14.8 05/30/2009 0528    No results found for: POCLITH, LITHIUM   No results found for: PHENYTOIN, PHENOBARB, VALPROATE, CBMZ   .res Assessment: Plan:   Pt seen for 30 minutes and time spent reviewing response to medications and discussing treatment plan. Discussed that higher doses of Lamictal can cause cognitive side effects and that she had reported difficulty with memory, word finding, and concentration  over the summer when she was previously taking Lamictal 300 mg daily. Will therefore decrease Lamictal to 200  mg po qd to possibly improve cognitive side effects.  Discussed starting Lithium to help with mood lability and periods of severe depression with PMDD. Discussed that she may want to consider paying cash for a partial fill since she is unable to locate bottle of Lithium that was previously filled. Pt agrees to starting Lithium 150 mg po QHS for 3-5 nights, then increasing to 300 mg po QHS.  Continue Rexulti 2 mg po qd for anxiety and mood s/s.  Continue Vyvanse 70 mg po qd for ADHD.  Continue Modafinil 200 mg po BID for excessive fatigue and somnolence.  Continue Trazodone 200 mg po QHS prn insomnia.  Recommend continuing therapy with Sammuel Cooper, LCSW.  Recommend extending leave of absence with tentative return to work date of 09/20/20. Pt to follow-up in 4 weeks or sooner if clinically indicated.  Patient advised to contact office with any questions, adverse effects, or acute worsening in signs and symptoms.   Chelise was seen today for depression, anxiety, adhd and other.  Diagnoses and all orders for this visit:  Mood disorder due to known physiological condition with mixed features -     lamoTRIgine (LAMICTAL) 200 MG tablet; Take 1 tablet (200 mg total) by mouth daily.  PMDD (premenstrual dysphoric disorder) -     lamoTRIgine (LAMICTAL) 200 MG tablet; Take 1 tablet (200 mg total) by mouth daily.     Please see After Visit Summary for patient specific instructions.  Future Appointments  Date Time Provider [facility]  08/19/2021 10:00 AM Barnie Del, LCSW [facility] None  09/30/2021  3:00 PM Barnie Del, LCSW [facility] None  10/14/2021  1:00 PM Barnie Del, LCSW [facility] None  10/28/2021  1:00 PM Barnie Del, LCSW [facility] None    No orders of the defined types were placed in this encounter.     -------------------------------

## 2021-08-16 DIAGNOSIS — Z0289 Encounter for other administrative examinations: Secondary | ICD-10-CM

## 2021-08-18 NOTE — Telephone Encounter (Signed)
Form and records have been faxed to [facility]

## 2021-08-19 ENCOUNTER — Telehealth: Payer: Self-pay

## 2021-08-19 ENCOUNTER — Ambulatory Visit (INDEPENDENT_AMBULATORY_CARE_PROVIDER_SITE_OTHER): Payer: No Typology Code available for payment source | Admitting: Addiction (Substance Use Disorder)

## 2021-08-19 DIAGNOSIS — F3281 Premenstrual dysphoric disorder: Secondary | ICD-10-CM

## 2021-08-19 DIAGNOSIS — R5383 Other fatigue: Secondary | ICD-10-CM | POA: Diagnosis not present

## 2021-08-19 NOTE — Telephone Encounter (Signed)
Received FMLA form from [place] for completion. Placed on Traci's desk.

## 2021-08-19 NOTE — Progress Notes (Signed)
[facility] Counselor/Therapist Progress Note  Patient ID: Sheila James, MRN: [id number],    Date: 08/19/2021  Time Spent: 26mins  Treatment Type: Individual Therapy  Reported Symptoms: depression, hypersomnia  Mental Status Exam:  Appearance:   Casual and Well Groomed     Behavior:  Appropriate and Sharing  Motor:  Normal   Speech/Language:   Clear and Coherent  Affect:  Appropriate and Congruent  Mood:  depressed and labile   Thought process:  normal    Thought content:    Obsessions and Rumination  Sensory/Perceptual disturbances:    WNL  Orientation:  x4  Attention:  Fair  Concentration:  Fair  Memory:  WNL  Fund of knowledge:   Good  Insight:    Good  Judgment:   Fair  Impulse Control:  Fair   Risk Assessment: Danger to Self:  No Self-injurious Behavior: No Danger to Others: No Duty to Warn:no Physical Aggression / Violence:No  Access to Firearms a concern: No  Gang Involvement:No   Virtual Visit via VIDEO: I connected with client by MyChart video enabled telemedicine/telehealth application, with their informed consent, and verified client privacy and that I am speaking with the correct person using two identifiers. I discussed the limitations, risks, security and privacy concerns of performing psychotherapy and management service virtually and confirmed their location. I also discussed with the patient that there may be a patient responsible charge related to this service and to confirm with the front desk if their insurance covers teletherapy. I also discussed with the patient the availability of in person appointments. The patient expressed understanding and agreed to proceed. I discussed the treatment planning with the client. The client was provided an opportunity to ask questions and all were answered. The client agreed with the plan and demonstrated an understanding of the instructions. The client was advised to call our office if symptoms worsen  or feel they are in a crisis state and need immediate contact. Client also reminded of a crisis line number and to use 9-1-1 if there's an emergency.  Therapist Location: office; Client Location: home.   Subjective: Client expressed feeing ongoing depression and severe hypersomnia. Client has been unable to stay awake at home while eating and outside of a short nap and the nighttime. Client processed ways she is trying to fix this, including taking her meds and using supplements to help her try to get energy, with no progress. Client processed her frustrations with this and therapist used MI & CBT with client to support her in processing her feelings of helplessness in fixing these symptoms shes experiencing as a result of her PMDD etc. Therapist also used SFT with client to help her think through other options and client added to her RP plan and SMART goals to help her care for her health. Therapist assessed for client stability; Client denied SI/HI/AVH.  Interventions: Cognitive Behavioral Therapy, Motivational Interviewing, Solution-Oriented/Positive Psychology, and RPT  & safety planning  Diagnosis:   ICD-10-CM   1. PMDD (premenstrual dysphoric disorder)  F32.81     2. Fatigue, unspecified type  R53.83        Plan of Care:  Client to return for weekly therapy with Sammuel Cooper, therapist, to review again in 6 months.  Client to engage in positive self talk and challenging negative internal ruminations and self talk causing client to be overly anxious and worried using CBT, on daily practice. Client to engage in mindfulness: ie body scans each eveneing to  help process and discharge emotional distress & recognize emotions. Client to utilize BSP (brainspotting) with therapist to help client regulate their anxiety in a somatic- felt body sense way: (ie by working to reduce muscle tension, ruminations, increased heart rate, constant worrying and feeling "hyper" ) by decreasing anxiety by 33% in the  next 6 months.  Client to prioritize sleep 8+ hours each week night AEB going to bed by 10pm each night.    Barnie Del, LCSW, LCAS, CCTP, CCS-I, BSP

## 2021-08-23 ENCOUNTER — Other Ambulatory Visit: Payer: Self-pay | Admitting: Endocrinology

## 2021-08-25 ENCOUNTER — Telehealth: Payer: Self-pay | Admitting: Psychiatry

## 2021-08-25 NOTE — Telephone Encounter (Signed)
Pt called and said that the generic vyvanse doesn't work as well as the brand. She wanted Janett Billow to know that

## 2021-08-25 NOTE — Telephone Encounter (Signed)
Pt stated she normally gets brand name vyvanse but generic was called in.She did not realize it and has been taking it for about a week.She stated it's not as helpful for focus/concentration

## 2021-08-26 NOTE — Telephone Encounter (Signed)
To my knowledge, generic Vyvanse is not yet available. Please ask her to confirm what she is currently taking or confirm with pharmacy if needed.

## 2021-08-26 NOTE — Telephone Encounter (Signed)
Please advise pt to schedule follow-up apt.

## 2021-08-26 NOTE — Telephone Encounter (Signed)
Pt lvm that it is the brand vyvanse and it is not working anymore

## 2021-08-26 NOTE — Telephone Encounter (Signed)
Pt stated it is the brand vyvanse and it's just not working anymore.When we spoke yesterday she said that her concentration/focus was decreased.

## 2021-09-01 ENCOUNTER — Telehealth (INDEPENDENT_AMBULATORY_CARE_PROVIDER_SITE_OTHER): Payer: No Typology Code available for payment source | Admitting: Addiction (Substance Use Disorder)

## 2021-09-01 DIAGNOSIS — R5383 Other fatigue: Secondary | ICD-10-CM | POA: Diagnosis not present

## 2021-09-01 DIAGNOSIS — F0634 Mood disorder due to known physiological condition with mixed features: Secondary | ICD-10-CM | POA: Diagnosis not present

## 2021-09-01 NOTE — Progress Notes (Signed)
?    [facility] Counselor/Therapist Progress Note ? ?Patient ID: Sheila James, MRN: [id number],   ? ?Date: 09/01/2021 ? ?Time Spent: 53 mins ? ?Treatment Type: Individual Therapy ? ?Reported Symptoms: mood swings, low motivation ? ?Mental Status Exam: ? ?Appearance:   Casual and Well Groomed     ?Behavior:  Appropriate and Sharing  ?Motor:  Normal   ?Speech/Language:   Clear and Coherent  ?Affect:  Appropriate and Congruent  ?Mood:  depressed   ?Thought process:  normal  ?  ?Thought content:    Obsessions and Rumination  ?Sensory/Perceptual disturbances:    WNL  ?Orientation:  x4  ?Attention:  Good  ?Concentration:  Fair  ?Memory:  WNL  ?Fund of knowledge:   Good  ?Insight:    Good  ?Judgment:   Fair  ?Impulse Control:  Fair  ? ?Risk Assessment: ?Danger to Self:  No ?Self-injurious Behavior: No ?Danger to Others: No ?Duty to Warn:no ?Physical Aggression / Violence:No  ?Access to Firearms a concern: No  ?Gang Involvement:No  ? ?Virtual Visit via VIDEO: ?I connected with client by MyChart video enabled telemedicine/telehealth application, with their informed consent, and verified client privacy and that I am speaking with the correct person using two identifiers. I discussed the limitations, risks, security and privacy concerns of performing psychotherapy and management service virtually and confirmed their location. I also discussed with the patient that there may be a patient responsible charge related to this service and to confirm with the front desk if their insurance covers teletherapy. I also discussed with the patient the availability of in person appointments. The patient expressed understanding and agreed to proceed. ?I discussed the treatment planning with the client. The client was provided an opportunity to ask questions and all were answered. The client agreed with the plan and demonstrated an understanding of the instructions. The client was advised to call our office if symptoms worsen or feel  they are in a crisis state and need immediate contact. Client also reminded of a crisis line number and to use 9-1-1 if there's an emergency.  ?Therapist Location: office; Client Location: home.  ? ?Subjective: Client expressed having ongoing mood swings and depression. Client processed her inability to balance her self care of taking her meds and making herself meals with parenting or working. Client anxious about returning to work in the near future as "her meds still aren't stabilizing her moods". Client processed her mood issues and depression symptoms and therapist used MI to support client as she processed encouraging her and helping her to think of solutions or ways to cope, using SFT. Therapist also used safety planing to assess for client stability and safety. Client denied SI/HI/AVH and reported no thoughts of dying. Client used RPT to process one SMART achievable goal for her self-care.  ? ?Interventions: Cognitive Behavioral Therapy, Motivational Interviewing, Solution-Oriented/Positive Psychology, and RPT  & safety planning ? ?Diagnosis: ?  ICD-10-CM   ?1. Mood disorder due to known physiological condition with mixed features  F06.34   ?  ?2. Fatigue, unspecified type  R53.83   ?  ? ? ?Plan of Care:  ?Client to return for weekly therapy with Sammuel Cooper, therapist, to review again in 6 months.  ?Client to engage in positive self talk and challenging negative internal ruminations and self talk causing client to be overly anxious and worried using CBT, on daily practice. ?Client to engage in mindfulness: ie body scans each eveneing to help process and discharge emotional distress & recognize  emotions. ?Client to utilize BSP (brainspotting) with therapist to help client regulate their anxiety in a somatic- felt body sense way: (ie by working to reduce muscle tension, ruminations, increased heart rate, constant worrying and feeling "hyper" ) by decreasing anxiety by 33% in the next 6 months.  ?Client to  prioritize sleep 8+ hours each week night AEB going to bed by 10pm each night.  ?  ?Barnie Del, LCSW, LCAS, CCTP, CCS-I, BSP ?

## 2021-09-07 ENCOUNTER — Ambulatory Visit (INDEPENDENT_AMBULATORY_CARE_PROVIDER_SITE_OTHER): Payer: No Typology Code available for payment source | Admitting: Addiction (Substance Use Disorder)

## 2021-09-07 DIAGNOSIS — F3281 Premenstrual dysphoric disorder: Secondary | ICD-10-CM

## 2021-09-07 DIAGNOSIS — F0634 Mood disorder due to known physiological condition with mixed features: Secondary | ICD-10-CM | POA: Diagnosis not present

## 2021-09-07 NOTE — Progress Notes (Signed)
?    [facility] Counselor/Therapist Progress Note ? ?Patient ID: Sheila James, MRN: [id number],   ? ?Date: 09/07/2021 ? ?Time Spent: 29mns ? ?Treatment Type: Individual Therapy ? ?Reported Symptoms: a little more hopeful, trying to stabilize her moods ? ?Mental Status Exam: ? ?Appearance:   Casual and Well Groomed     ?Behavior:  Appropriate and Sharing  ?Motor:  Normal   ?Speech/Language:   Clear and Coherent  ?Affect:  Appropriate and Congruent  ?Mood:  anxious, depressed, and labile   ?Thought process:  normal  ?  ?Thought content:    Obsessions and Rumination  ?Sensory/Perceptual disturbances:    WNL  ?Orientation:  x4  ?Attention:  Fair  ?Concentration:  Fair  ?Memory:  WNL  ?Fund of knowledge:   Good  ?Insight:    Good  ?Judgment:   Fair  ?Impulse Control:  Fair  ? ?Risk Assessment: ?Danger to Self:  No ?Self-injurious Behavior: No ?Danger to Others: No ?Duty to Warn:no ?Physical Aggression / Violence:No  ?Access to Firearms a concern: No  ?Gang Involvement:No  ? ?Virtual Visit via VIDEO: ?I connected with client by MyChart video enabled telemedicine/telehealth application, with their informed consent, and verified client privacy and that I am speaking with the correct person using two identifiers. I discussed the limitations, risks, security and privacy concerns of performing psychotherapy and management service virtually and confirmed their location. I also discussed with the patient that there may be a patient responsible charge related to this service and to confirm with the front desk if their insurance covers teletherapy. I also discussed with the patient the availability of in person appointments. The patient expressed understanding and agreed to proceed. ?I discussed the treatment planning with the client. The client was provided an opportunity to ask questions and all were answered. The client agreed with the plan and demonstrated an understanding of the instructions. The client was advised  to call our office if symptoms worsen or feel they are in a crisis state and need immediate contact. Client also reminded of a crisis line number and to use 9-1-1 if there's an emergency.  ?Therapist Location: office; Client Location: home.  ? ?Subjective: Client expressed feeling less afraid of losing her apartment and feeling in survival mode as much, because she finally got her short term disability. Client feeling a little more hopeful, trying to stabilize her moods using food and exercise plans based on your hormone type. Client processed her progress in starting these goals and therapist used MI & SFT with client to validate her progress and struggle with her moods, while helping her to take next steps towards her SMART goals towards her better health. Client processed taking her meds more regularly and making progress in creating a few rythms for her health and to try to regulate her moods. Therapist assessed for client stability; Client denied SI/HI/AVH. ? ?Interventions: Motivational Interviewing, Solution-Oriented/Positive Psychology, and RPT   ? ?Diagnosis: ?  ICD-10-CM   ?1. PMDD (premenstrual dysphoric disorder)  F32.81   ?  ?2. Mood disorder due to known physiological condition with mixed features  F06.34   ?  ? ? ? ?Plan of Care:  ?Client to return for weekly therapy with KSammuel Cooper therapist, to review again in 6 months.  ?Client to engage in positive self talk and challenging negative internal ruminations and self talk causing client to be overly anxious and worried using CBT, on daily practice. ?Client to engage in mindfulness: ie body scans each eveneing to  help process and discharge emotional distress & recognize emotions. ?Client to utilize BSP (brainspotting) with therapist to help client regulate their anxiety in a somatic- felt body sense way: (ie by working to reduce muscle tension, ruminations, increased heart rate, constant worrying and feeling "hyper" ) by decreasing anxiety by 33% in  the next 6 months.  ?Client to prioritize sleep 8+ hours each week night AEB going to bed by 10pm each night.  ?  ?Barnie Del, LCSW, LCAS, CCTP, CCS-I, BSP ?

## 2021-09-17 ENCOUNTER — Telehealth: Payer: Self-pay | Admitting: Psychiatry

## 2021-09-17 NOTE — Telephone Encounter (Signed)
Received fax from [place]  disability and leave paperwork to complete. Placed in [place] box.  ?

## 2021-09-21 ENCOUNTER — Encounter: Payer: Self-pay | Admitting: Psychiatry

## 2021-09-21 ENCOUNTER — Telehealth (INDEPENDENT_AMBULATORY_CARE_PROVIDER_SITE_OTHER): Payer: No Typology Code available for payment source | Admitting: Psychiatry

## 2021-09-21 DIAGNOSIS — F0634 Mood disorder due to known physiological condition with mixed features: Secondary | ICD-10-CM | POA: Diagnosis not present

## 2021-09-21 DIAGNOSIS — R5383 Other fatigue: Secondary | ICD-10-CM | POA: Diagnosis not present

## 2021-09-21 DIAGNOSIS — F3281 Premenstrual dysphoric disorder: Secondary | ICD-10-CM

## 2021-09-21 DIAGNOSIS — F902 Attention-deficit hyperactivity disorder, combined type: Secondary | ICD-10-CM | POA: Diagnosis not present

## 2021-09-21 MED ORDER — LITHIUM CARBONATE 150 MG PO CAPS: 300.0000 mg | ORAL_CAPSULE | Freq: Every day | ORAL | 2 refills | Status: DC

## 2021-09-21 MED ORDER — MODAFINIL 200 MG PO TABS: ORAL_TABLET | ORAL | 1 refills | Status: DC

## 2021-09-21 MED ORDER — LAMOTRIGINE 150 MG PO TABS: 150.0000 mg | ORAL_TABLET | Freq: Every day | ORAL | 2 refills | Status: DC

## 2021-09-21 MED ORDER — LISDEXAMFETAMINE DIMESYLATE 70 MG PO CAPS: 70.0000 mg | ORAL_CAPSULE | Freq: Every day | ORAL | 0 refills | Status: DC

## 2021-09-21 MED ORDER — BREXPIPRAZOLE 2 MG PO TABS: 2.0000 mg | ORAL_TABLET | Freq: Every day | ORAL | 1 refills | Status: DC

## 2021-09-21 NOTE — Progress Notes (Signed)
Sheila James ?[id number] ?01/20/1976 ?46 y.o. ? ?Virtual Visit via Video Note ? ?I connected with pt @ on 09/21/21 at  2:00 PM EDT by a video enabled telemedicine application and verified that I am speaking with the correct person using two identifiers. ?  ?I discussed the limitations of evaluation and management by telemedicine and the availability of in person appointments. The patient expressed understanding and agreed to proceed. ? ?I discussed the assessment and treatment plan with the patient. The patient was provided an opportunity to ask questions and all were answered. The patient agreed with the plan and demonstrated an understanding of the instructions. ?  ?The patient was advised to call back or seek an in-person evaluation if the symptoms worsen or if the condition fails to improve as anticipated. ? ?I provided 40 minutes of non-face-to-face time during this encounter.  The patient was located in her personal vehicle.  The provider was located at [place]. ? ? ?Thayer Headings, PMHNP  ? ?Subjective:  ? ?Patient ID:  Sheila James is a 46 y.o. (DOB 1975/07/26) female. ? ?Chief Complaint:  ?Chief Complaint  ?Patient presents with  ? Follow-up  ?  PMDD, mood disorder, anxiety, insomnia  ? ? ?HPI ?Sheila James presents for follow-up of PMDD, mood disorder, anxiety, and insomnia. She reports that she had severe PMDD s/s in February. She reports that her luteal phase started last week and "it was not good. I had absolutely no energy. I felt like I could not function.... I could not move or leave the house for 3 days." She reports that she had low motivation and interest in things. She reports that she started to feel better on Sunday. She reports that she tried to relax on Monday and "today has been much better." She reports that she has been able to sleep over the last several days. She reports that her energy is affected by glucose levels and PMDD s/s. She has been trying  to keep a closer watch on what she is eating.  ? ?She reports lithium "may be helping" and that today she has noticed an improvement in her mood and typically she does not experience an improvement in her mood between the luteal phase and onset of menses. She reports that she may have missed some Lithium doses for a few days. ? ?She reports that her memory and word finding difficulties have improved some since decrease in Lamictal dose. She reports that she continues to have severe difficulty with concentration and feels "pulled into different things." Easily distracted. Feels that Vyvanse is not as effective as it once was. She reports that she is not as productive as she used to be.  ? ?She reports that overall her mood has been better this month compared to last week and attributes some of this to son having some improvement in behavior. She reports that she has had some mild depression outside of PMDD s/s. She reports that she has had less episodes of tearfulness and most of these episodes were in the luteal phase.  She reports that she has had a "voracious appetite," particularly when experiencing PMDD. Denies impulsive or risky behavior. Denies SI.  ? ?She reports that she may be experiencing some "anxiety issues." She reports anxiety in response to things that she feels are beyond her control. She reports worry and catastrophic thinking about son's behavior.  ? ?She reports that she has been seeking information about strategies to help with PMDD and considering treatment  options. She reports that she has obgyn visit scheduled in early April. in early April and plans to discuss options further then.  ? ?She reports that she is unsure if Modafinil mid-day has been effective.  ? ?Continuing to see Sammuel Cooper, LCSW.  ? ?Past Psychiatric Medication Trials: ?Wellbutrin- allergic reaction. ?Sertraline ?Prozac ?Buspar ?Lamictal- Some improvement in depressive s/s outside of pre-menstrual time ?Latuda- Caused  increased glucose and cholesterol levels.  ?Abilify- helpful for depression. Wt. Gain.  ?Risperdal ?Geodon ?Adderall XR ?Vyvanse ?Concerta- Ineffective (short duration and minimal improvement) ?Modafinil ?Melatonin- Ineffective ?Trazodone ?Doxepin- not helpful for sleep initiation ? ?Review of Systems:  ?Review of Systems  ?Gastrointestinal: Negative.   ?Musculoskeletal:  Negative for gait problem.  ?Neurological:  Negative for tremors.  ?Psychiatric/Behavioral:    ?     Please refer to HPI  ? ?Medications: I have reviewed the patient's current medications. ? ?Current Outpatient Medications  ?Medication Sig Dispense Refill  ? B Complex Vitamins (VITAMIN-B COMPLEX PO) Take by mouth.    ? ferrous sulfate 325 (65 FE) MG tablet Take 325 mg by mouth daily with breakfast.    ? ibuprofen (ADVIL) 200 MG tablet Take 200 mg by mouth every 6 (six) hours as needed.    ? LYSINE PO Take by mouth.    ? Multiple Minerals-Vitamins (CALCIUM-MAGNESIUM-ZINC-D3 PO) Take by mouth.    ? Multiple Vitamins-Minerals (MULTIVITAMIN WOMEN PO) Take by mouth.    ? norethindrone (MICRONOR) 0.35 MG tablet Take 1 tablet by mouth daily.    ? traZODone (DESYREL) 100 MG tablet Take 2 tablets (200 mg total) by mouth at bedtime. (Patient taking differently: Take 200 mg by mouth at bedtime as needed.) 180 tablet 0  ? vitamin E 100 UNIT capsule Take 100 Units by mouth daily.    ? ALBUTEROL SULFATE IN Inhale into the lungs.    ? atorvastatin (LIPITOR) 20 MG tablet Take 20 mg by mouth daily. (Patient not taking: Reported on 08/25/2020)    ? brexpiprazole (REXULTI) 2 MG TABS tablet Take 1 tablet (2 mg total) by mouth daily. 90 tablet 1  ? lamoTRIgine (LAMICTAL) 150 MG tablet Take 1 tablet (150 mg total) by mouth daily. 30 tablet 2  ? lisdexamfetamine (VYVANSE) 70 MG capsule Take 1 capsule (70 mg total) by mouth daily. 30 capsule 0  ? lisdexamfetamine (VYVANSE) 70 MG capsule Take 1 capsule (70 mg total) by mouth daily. 30 capsule 0  ? [START ON 10/19/2021]  lisdexamfetamine (VYVANSE) 70 MG capsule Take 1 capsule (70 mg total) by mouth daily. 30 capsule 0  ? lithium carbonate 150 MG capsule Take 2 capsules (300 mg total) by mouth at bedtime. 60 capsule 2  ? metFORMIN (GLUCOPHAGE-XR) 500 MG 24 hr tablet Take 2 tablets (1,000 mg total) by mouth every morning. (Patient not taking: Reported on 09/21/2021) 180 tablet 3  ? [START ON 10/19/2021] modafinil (PROVIGIL) 200 MG tablet Take 1 tablet in the morning and mid-day 60 tablet 1  ? nabumetone (RELAFEN) 750 MG tablet Take 750 mg by mouth daily as needed.  (Patient not taking: Reported on 08/12/2021)    ? ?No current facility-administered medications for this visit.  ? ? ?Medication Side Effects: Other: Some forgetfulness ? ?Allergies:  ?Allergies  ?Allergen Reactions  ? Wellbutrin Xl [Bupropion] Hives  ? ? ?Past Medical History:  ?Diagnosis Date  ? Asthma   ? Bursitis   ? Diabetes mellitus, type II ([place])   ? Elevated cholesterol   ? Fatigue   ?  H/O: depression   ? Migraines   ? Pica   ? ? ?Family History  ?Problem Relation Age of Onset  ? Diabetes Mother   ? Anxiety disorder Mother   ? Diabetes Father   ? Hypertension Father   ? Asthma Brother   ? ADD / ADHD Brother   ? Diabetes Brother   ? Anxiety disorder Maternal Aunt   ? Anxiety disorder Maternal Aunt   ? ADD / ADHD Son   ? ? ?Social History  ? ?Socioeconomic History  ? Marital status: Divorced  ?  Spouse name: Not on file  ? Number of children: 2  ? Years of education: Not on file  ? Highest education level: Bachelor's degree (e.g., BA, AB, BS)  ?Occupational History  ? Occupation: customer service  ?Tobacco Use  ? Smoking status: Never  ? Smokeless tobacco: Never  ?Substance and Sexual Activity  ? Alcohol use: No  ? Drug use: Never  ? Sexual activity: Not Currently  ?  Partners: Male  ?Other Topics Concern  ? Not on file  ?Social History Narrative  ? Lives with children  ? Caffeine- none  ? ?Social Determinants of Health  ? ?Financial Resource Strain: Not on file  ?Food  Insecurity: Not on file  ?Transportation Needs: Not on file  ?Physical Activity: Not on file  ?Stress: Not on file  ?Social Connections: Not on file  ?Intimate Partner Violence: Not on file  ? ? ?Past Medical Histo

## 2021-09-22 DIAGNOSIS — Z0289 Encounter for other administrative examinations: Secondary | ICD-10-CM

## 2021-09-24 ENCOUNTER — Telehealth: Payer: Self-pay

## 2021-09-24 ENCOUNTER — Telehealth: Payer: Self-pay | Admitting: Psychiatry

## 2021-09-24 NOTE — Telephone Encounter (Signed)
Patient needs a PA. I initiated that today and notified patient. She picked up some capsules and paid OOP.  ?

## 2021-09-24 NOTE — Telephone Encounter (Signed)
Faxed paperwork to [facility]  09/24/21 ?Fax # [phone] ?

## 2021-09-24 NOTE — Telephone Encounter (Signed)
Sheila James lvm stating her Vyvanse Rx was not covered by her insurance due to not being brand name. She is requesting a new script written stating brand name only so she can obtain her full 30 day supply. Direct inquiries to # [phone]. ?

## 2021-09-24 NOTE — Telephone Encounter (Addendum)
Prior Authorization ?Vyvanse 70 mg initiated ?[facility] ? ?Approval received 09/24/21. New Rx sent.  ? ?

## 2021-09-29 ENCOUNTER — Other Ambulatory Visit: Payer: Self-pay

## 2021-09-29 DIAGNOSIS — F902 Attention-deficit hyperactivity disorder, combined type: Secondary | ICD-10-CM

## 2021-09-29 MED ORDER — LISDEXAMFETAMINE DIMESYLATE 70 MG PO CAPS: 70.0000 mg | ORAL_CAPSULE | Freq: Every day | ORAL | 0 refills | Status: DC

## 2021-09-30 ENCOUNTER — Ambulatory Visit: Payer: No Typology Code available for payment source | Admitting: Addiction (Substance Use Disorder)

## 2021-09-30 DIAGNOSIS — F0634 Mood disorder due to known physiological condition with mixed features: Secondary | ICD-10-CM

## 2021-09-30 NOTE — Progress Notes (Signed)
Client wasn't reminded about her appt.  ?No Charge today.  ?

## 2021-10-01 ENCOUNTER — Telehealth: Payer: Self-pay | Admitting: Psychiatry

## 2021-10-01 NOTE — Telephone Encounter (Signed)
Pt lvm at 4:00 pm yesterday saying that she received a letter from [place] that she was only approved to 4/2.23. She said that it has to be escalated by two 2 clinicians to be supported thru may. Please give her a call at [phone] ?

## 2021-10-05 ENCOUNTER — Telehealth: Payer: Self-pay | Admitting: Psychiatry

## 2021-10-05 NOTE — Telephone Encounter (Signed)
Pt called reporting meds working well until added stress. 5 of the last 7 days she has had depression. Contaact # [id number]. Also, reporting [employer] is requiring paperwork every 2 weeks now. Have we received paperwork?  ?

## 2021-10-06 NOTE — Telephone Encounter (Signed)
Attempted to contact pt. No answer

## 2021-10-06 NOTE — Telephone Encounter (Signed)
Patient stating the Lamictal and Rexulti are not helping her depression when she gets stressed. She also says she is not sleeping well but when she takes trazodone she is able to get about 4 hours of sleep. She takes her Vyvanse about 6:30 a.m. ?

## 2021-10-06 NOTE — Telephone Encounter (Signed)
Please contact pt to schedule earlier apt. Virtual apts available next week.  ?

## 2021-10-12 ENCOUNTER — Telehealth: Payer: No Typology Code available for payment source | Admitting: Psychiatry

## 2021-10-12 DIAGNOSIS — Z91199 Patient's noncompliance with other medical treatment and regimen due to unspecified reason: Secondary | ICD-10-CM

## 2021-10-12 NOTE — Progress Notes (Signed)
Sheila James ?[id number] ?03-11-1976 ?46 y.o. ? ? ?Pt did not sign into video visit. Attempted to call patient by phone x 2. Unable to reach pt. Unable to leave message due to voicemail being full.  ?

## 2021-10-13 ENCOUNTER — Telehealth: Payer: Self-pay | Admitting: Psychiatry

## 2021-10-13 NOTE — Telephone Encounter (Signed)
Pt missed appt with Janett Billow yesterday.  Janett Billow is on vac [place].  Pt is stating she needs her Short Term Disability forms sent in before the 17th.  Pls call her back to see if this is something that can be done in Jessica's absence.  She acknowledges that her meds are still not correct and she already has another appt with Janett Billow for 5/3. ? ? ?

## 2021-10-13 NOTE — Telephone Encounter (Signed)
Please see message. °

## 2021-10-14 ENCOUNTER — Ambulatory Visit (INDEPENDENT_AMBULATORY_CARE_PROVIDER_SITE_OTHER): Payer: Self-pay | Admitting: Addiction (Substance Use Disorder)

## 2021-10-14 DIAGNOSIS — Z91199 Patient's noncompliance with other medical treatment and regimen due to unspecified reason: Secondary | ICD-10-CM

## 2021-10-14 NOTE — Progress Notes (Signed)
Client no-showed provider for virtual session. Charge $50 ?

## 2021-10-14 NOTE — Telephone Encounter (Signed)
Will review Sheila James's last note ?

## 2021-10-15 NOTE — Telephone Encounter (Signed)
Her last std was faxed on 09/22/21 after her 09/21/21 visit. There have not been any visits since to update. The last tentative rtw date is 11/08/2021. There isn't any updated information to submit that I am aware of. Will discuss with Janett Billow on Monday then fax. ?

## 2021-10-18 ENCOUNTER — Telehealth: Payer: Self-pay | Admitting: Psychiatry

## 2021-10-18 NOTE — Telephone Encounter (Signed)
Called mobile # twice and mailbox is full. LVM on home #.  ?

## 2021-10-18 NOTE — Telephone Encounter (Signed)
Please add her to cancellation list.  ?

## 2021-10-18 NOTE — Telephone Encounter (Signed)
Pt called reporting she has cut Lamotrigine 150 mg in half to 75 mg daily for last 2 days. Since she can't remember anything from morning to night.  Increasingly gotten worse. Advise how to stop med. [phone]. ?

## 2021-10-28 ENCOUNTER — Ambulatory Visit (INDEPENDENT_AMBULATORY_CARE_PROVIDER_SITE_OTHER): Payer: No Typology Code available for payment source | Admitting: Addiction (Substance Use Disorder)

## 2021-10-28 DIAGNOSIS — F0634 Mood disorder due to known physiological condition with mixed features: Secondary | ICD-10-CM | POA: Diagnosis not present

## 2021-10-28 NOTE — Progress Notes (Signed)
?      [facility] Counselor/Therapist Progress Note ? ?Patient ID: Sheila James, MRN: [id number],   ? ?Date: 10/28/2021 ? ?Time Spent: 59mns ? ?Treatment Type: Individual Therapy ? ?Reported Symptoms: memory loss, panic, depression ? ?Mental Status Exam: ? ?Appearance:   Casual and Well Groomed     ?Behavior:  Appropriate and Sharing  ?Motor:  Normal   ?Speech/Language:   Clear and Coherent  ?Affect:  Depressed  ?Mood:  anxious, depressed, and labile   ?Thought process:  normal  ?  ?Thought content:    Obsessions and Rumination  ?Sensory/Perceptual disturbances:    WNL  ?Orientation:  x4  ?Attention:  Fair  ?Concentration:  Fair  ?Memory:  WNL  ?Fund of knowledge:   Good  ?Insight:    Good  ?Judgment:   Fair  ?Impulse Control:  Fair  ? ?Risk Assessment: ?Danger to Self:  No ?Self-injurious Behavior: No ?Danger to Others: No ?Duty to Warn:no ?Physical Aggression / Violence:No  ?Access to Firearms a concern: No  ?Gang Involvement:No  ? ?Subjective: Client expressed increasing memory loss and worsening depression. Client processed things she normally remembers and can do on her own, but is unable to remember or do on her own without assistance. 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 Therapist used MI & CBT with client to explore and inquire about client's brain fog and memory conflicts possibly due to worsening depression. Client processed feeling more panicked and overwhelmed about the idea of going back to work or having more responsibilities. Therapist used CBT with client to help her process her feelings/fears and her thoughts about the possible triggers for this worsening mental health. Therapist assessed for client safety; Client denied SI/HI/AVH and reported no thoughts of hurting herself.  ? ?Interventions: Cognitive Behavioral  Therapy, Motivational Interviewing, and RPT  & safety planning ? ?Diagnosis: ?  ICD-10-CM   ?1. Mood disorder due to known physiological condition with mixed features  F06.34   ?  ? ?  ?Plan of Care:  ?Client to return for weekly therapy with KSammuel Cooper therapist, to review again in 6 months.  ?Client to engage in positive self talk and challenging negative internal ruminations and self talk causing client to be overly anxious and worried using CBT, on daily practice. ?Client to engage in mindfulness: ie body scans each eveneing to help process and discharge emotional distress & recognize emotions. ?Client to utilize BSP (brainspotting) with therapist to help client regulate their anxiety in a somatic- felt body sense way: (ie by working to reduce muscle tension, ruminations, increased heart rate, constant worrying and feeling "hyper" ) by decreasing anxiety by 33% in the next 6 months.  ?Client to prioritize sleep 8+ hours each week night AEB going to bed by 10pm each night.  ?  ?KBarnie Del LCSW, LCAS, CCTP, CCS-I, BSP ?

## 2021-10-29 ENCOUNTER — Encounter: Payer: Self-pay | Admitting: Psychiatry

## 2021-10-29 ENCOUNTER — Telehealth: Payer: Self-pay | Admitting: Psychiatry

## 2021-10-29 ENCOUNTER — Ambulatory Visit (INDEPENDENT_AMBULATORY_CARE_PROVIDER_SITE_OTHER): Payer: No Typology Code available for payment source | Admitting: Psychiatry

## 2021-10-29 DIAGNOSIS — F902 Attention-deficit hyperactivity disorder, combined type: Secondary | ICD-10-CM | POA: Diagnosis not present

## 2021-10-29 DIAGNOSIS — G4700 Insomnia, unspecified: Secondary | ICD-10-CM

## 2021-10-29 MED ORDER — TRAZODONE HCL 100 MG PO TABS: ORAL_TABLET | ORAL | 0 refills | Status: DC

## 2021-10-29 NOTE — Telephone Encounter (Signed)
Received fax from [place] re: Sheila James. They need completion of [facility] Provider Statement. Placed in Sheila James's box. ?

## 2021-10-29 NOTE — Progress Notes (Signed)
Sheila James ?[id number] ?1975-09-04 ?46 y.o. ? ?Subjective:  ? ?Patient ID:  Sheila James is a 46 y.o. (DOB 1975-12-24) female. ? ?Chief Complaint:  ?Chief Complaint  ?Patient presents with  ? Other  ?  PMDD, mood disturbance, anxiety, insomnia, ADHD  ? ? ?HPI ?Sheila James presents to the office today for follow-up of PMDD, anxiety, ADHD, and insomnia.  ? ?She reports that March was "a good month for PMDD" with one week of symptoms. She reports that her menses started a full week early in April and had 7-8 days of mood decline in PMDD s/s during luteal phase and this continued through menses, which she reports is unusual. She recalls having heavy and severely painful menses in school when her periods started and would need to leave school early.  She reports that she had to start Norplant as a young teenager due to severe menses and this was helpful. She has seen her gynecologist and they told her to call to schedule a time to start the Nexplanon to help with PMDD s/s.  ? ?She reports that she has been having memory issues. She reports that she was unable to recall events the day after her birthday. She has difficulty reading and retaining information. She reports that she has been forgetting to do things she needs to complete. She reports that some tasks are taking much longer than expected for her to complete. Difficulty sustaining focus. She reports that memory and concentration issues occur "all the time" and are worse during PMDD. She reports that she stopped taking Lamictal about 2 weeks ago because she thought it was affecting her memory. She reports that she re-start Neuriva. Notices some improvement in concentration and cognition over the last few days.  ? ?She reports that she has some mild depression before start of luteal phase. Energy and motivation have been low. Some improvement in energy and motivation this week with improvement in "brain fog." ? ?She reports that she has  some generalized anxiety. "I think my whole life makes me anxious."  ? ?She reports that she has been falling asleep in the middle of the day. She continues to fall asleep during the daytime after taking Modafinil and reports this is with normal glucose levels. Has also been taking Vyvanse. Reports excessive somnolence with taking two Trazodone last night and plans to switch to 1.5 tabs. She has not had any severe insomnia recently. She reports some hypersomnia. Slept 8.5 hours last night. Averaging 7-9 hours of sleep. She reports food cravings. Denies SI.  ? ?She reports that she stopped taking Lithium for about 7 days to determine if this was causing excessive somnolence and does not think this was causing it and plans to re-start Lithium.  ? ?She remains out of work.  ? ?Past Psychiatric Medication Trials: ?Wellbutrin- allergic reaction. ?Sertraline ?Prozac ?Buspar ?Lamictal- Some improvement in depressive s/s outside of pre-menstrual time ?Latuda- Caused increased glucose and cholesterol levels. Caused sleepiness ?Abilify- helpful for depression. Wt. Gain.  ?Risperdal ?Geodon ?Adderall XR ?Vyvanse ?Concerta- Ineffective (short duration and minimal improvement) ?Modafinil ?Melatonin- Ineffective ?Trazodone ?Doxepin- not helpful for sleep initiation ? ? ?AIMS   ? ?[place] Office Visit from 10/29/2021 in [place] Visit from 07/23/2021 in [place] Visit from 08/25/2020 in [place] Visit from 11/13/2019 in [place]  ?AIMS Total Score 0 0 0 0  ? ?  ? ?PHQ2-9   ? ?Flowsheet Row Nutrition from 07/16/2020  [facility]  ?PHQ-2 Total Score 0  ? ?  ?  ? ?Review of Systems:  ?Review of Systems  ?Constitutional:  Positive for fatigue.  ?Endocrine:  ?     She reports improved glycemic control.  ?Musculoskeletal:  Negative for gait problem.  ?     Reports injury to hand from fall in April   ?Neurological:  Negative for tremors.  ?Psychiatric/Behavioral:    ?     Please refer to HPI  ? ?Medications: I have reviewed the patient's current medications. ? ?Current Outpatient Medications  ?Medication Sig Dispense Refill  ? ALBUTEROL SULFATE IN Inhale into the lungs.    ? B Complex Vitamins (VITAMIN-B COMPLEX PO) Take by mouth.    ? brexpiprazole (REXULTI) 2 MG TABS tablet Take 1 tablet (2 mg total) by mouth daily. 90 tablet 1  ? ferrous sulfate 325 (65 FE) MG tablet Take 325 mg by mouth daily with breakfast.    ? ibuprofen (ADVIL) 200 MG tablet Take 200 mg by mouth every 6 (six) hours as needed.    ? lisdexamfetamine (VYVANSE) 70 MG capsule Take 1 capsule (70 mg total) by mouth daily. 30 capsule 0  ? LYSINE PO Take by mouth.    ? metFORMIN (GLUCOPHAGE-XR) 500 MG 24 hr tablet Take 2 tablets (1,000 mg total) by mouth every morning. 180 tablet 3  ? Misc Natural Products (NEURIVA PO) Take by mouth.    ? modafinil (PROVIGIL) 200 MG tablet Take 1 tablet in the morning and mid-day 60 tablet 1  ? Multiple Minerals-Vitamins (CALCIUM-MAGNESIUM-ZINC-D3 PO) Take by mouth.    ? Multiple Vitamins-Minerals (MULTIVITAMIN WOMEN PO) Take by mouth.    ? norethindrone (MICRONOR) 0.35 MG tablet Take 1 tablet by mouth daily.    ? UNABLE TO FIND Med Name: Combat Cravings    ? vitamin E 100 UNIT capsule Take 100 Units by mouth daily.    ? atorvastatin (LIPITOR) 20 MG tablet Take 20 mg by mouth daily. (Patient not taking: Reported on 08/25/2020)    ? lisdexamfetamine (VYVANSE) 70 MG capsule Take 1 capsule (70 mg total) by mouth daily. 30 capsule 0  ? lisdexamfetamine (VYVANSE) 70 MG capsule Take 1 capsule (70 mg total) by mouth daily. 30 capsule 0  ? lithium carbonate 150 MG capsule Take 2 capsules (300 mg total) by mouth at bedtime. 60 capsule 2  ? nabumetone (RELAFEN) 750 MG tablet Take 750 mg by mouth daily as needed.  (Patient not taking: Reported on 08/12/2021)    ? traZODone (DESYREL) 100 MG tablet Take 1.5-2 tabs po QHS prn  insomnia 180 tablet 0  ? ?No current facility-administered medications for this visit.  ? ? ?Medication Side Effects: Other: Cognitive side effects with Lamictal ? ?Allergies:  ?Allergies  ?Allergen Reactions  ? Wellbutrin Xl [Bupropion] Hives  ? ? ?Past Medical History:  ?Diagnosis Date  ? Asthma   ? Bursitis   ? Diabetes mellitus, type II ([place])   ? Elevated cholesterol   ? Fatigue   ? H/O: depression   ? Migraines   ? Pica   ? ? ?Past Medical History, Surgical history, Social history, and Family history were reviewed and updated as appropriate.  ? ?Please see review of systems for further details on the patient's review from today.  ? ?Objective:  ? ?Physical Exam:  ?BP 113/71   Pulse 73  ? ?Physical Exam ?Constitutional:   ?   General: She is not in acute distress. ?  Musculoskeletal:     ?   General: No deformity.  ?Neurological:  ?   Mental Status: She is alert and oriented to person, place, and time.  ?   Coordination: Coordination normal.  ?Psychiatric:     ?   Attention and Perception: Attention and perception normal. She does not perceive auditory or visual hallucinations.     ?   Mood and Affect: Mood is anxious and depressed. Affect is not labile, blunt, angry or inappropriate.     ?   Speech: Speech normal.     ?   Behavior: Behavior normal.     ?   Thought Content: Thought content normal. Thought content is not paranoid or delusional. Thought content does not include homicidal or suicidal ideation. Thought content does not include homicidal or suicidal plan.     ?   Cognition and Memory: Cognition and memory normal.     ?   Judgment: Judgment normal.  ?   Comments: Insight intact  ? ? ?Lab Review:  ?No results found for: NA, K, CL, CO2, GLUCOSE, BUN, CREATININE, CALCIUM, PROT, ALBUMIN, AST, ALT, ALKPHOS, BILITOT, GFRNONAA, GFRAA ? ?   ?Component Value Date/Time  ? WBC 12.8 (H) 05/30/2009 0528  ? RBC 2.82 (L) 05/30/2009 0528  ? HGB 8.2 DELTA CHECK NOTED (L) 05/30/2009 0528  ? HCT 24.3 (L) 05/30/2009 0528   ? PLT 188 05/30/2009 0528  ? MCV 86.3 05/30/2009 0528  ? MCHC 33.8 05/30/2009 0528  ? RDW 14.8 05/30/2009 0528  ? ? ?No results found for: POCLITH, LITHIUM  ? ?No results found for: PHENYTOIN, PHENOBARB, VALPROAT

## 2021-11-03 ENCOUNTER — Ambulatory Visit: Payer: No Typology Code available for payment source | Admitting: Psychiatry

## 2021-11-04 DIAGNOSIS — Z0289 Encounter for other administrative examinations: Secondary | ICD-10-CM

## 2021-11-04 NOTE — Telephone Encounter (Signed)
Paper work completed signed and faxed  ?

## 2021-11-04 NOTE — Telephone Encounter (Signed)
Pt LVM at 1:23p today.  She said she left a message yesterday as well.  She said that [facility] requires the paperwork to be received by [place] by 5pm today.  She would also like a copy sent to her in case they say they don't get it, she would be able to send it to them. ? ?Pls let her know if this will get done today. ? ? ?Phone [phone] ?

## 2021-11-04 NOTE — Telephone Encounter (Signed)
See message.

## 2021-11-11 ENCOUNTER — Ambulatory Visit (INDEPENDENT_AMBULATORY_CARE_PROVIDER_SITE_OTHER): Payer: No Typology Code available for payment source | Admitting: Psychiatry

## 2021-11-11 ENCOUNTER — Encounter: Payer: Self-pay | Admitting: Psychiatry

## 2021-11-11 DIAGNOSIS — R5383 Other fatigue: Secondary | ICD-10-CM | POA: Diagnosis not present

## 2021-11-11 DIAGNOSIS — F902 Attention-deficit hyperactivity disorder, combined type: Secondary | ICD-10-CM

## 2021-11-11 DIAGNOSIS — F3281 Premenstrual dysphoric disorder: Secondary | ICD-10-CM | POA: Diagnosis not present

## 2021-11-11 DIAGNOSIS — G4700 Insomnia, unspecified: Secondary | ICD-10-CM

## 2021-11-11 DIAGNOSIS — F0634 Mood disorder due to known physiological condition with mixed features: Secondary | ICD-10-CM | POA: Diagnosis not present

## 2021-11-11 MED ORDER — LITHIUM CARBONATE 150 MG PO CAPS: 150.0000 mg | ORAL_CAPSULE | Freq: Every day | ORAL | 2 refills | Status: DC

## 2021-11-11 MED ORDER — LISDEXAMFETAMINE DIMESYLATE 70 MG PO CAPS: 70.0000 mg | ORAL_CAPSULE | Freq: Every day | ORAL | 0 refills | Status: DC

## 2021-11-11 MED ORDER — MODAFINIL 200 MG PO TABS: ORAL_TABLET | ORAL | 2 refills | Status: DC

## 2021-11-11 NOTE — Progress Notes (Signed)
Sheila James ?[id number] ?08-Mar-1976 ?46 y.o. ? ?Subjective:  ? ?Patient ID:  Sheila James is a 46 y.o. (DOB March 13, 1976) female. ? ?Chief Complaint:  ?Chief Complaint  ?Patient presents with  ? Follow-up  ?  Mood disturbance, Anxiety, insomnia, and ADHD  ? ? ?HPI ?Sheila James presents to the office today for follow-up of PMDD, mood disorder, anxiety, and insomnia. She reports feeling "a lot better. Sticking to my routine." She has started some dietary changes to help with diabetes and PMDD. She reports food cravings have decreased. Has apt with dietician on 11/22/21.  ? ?Mood has improved. Reports that mood was "a little down" on Tuesday and was able to get out of bed with effort. Reports feeling drained and occasionally feels a need to nap later in the day. Energy is lower mid-afternoon and taking Modafinil has helped her stay awake. She reports that her motivation is at 80% "which is better than it has been." Motivation is low in the evening. Denies excessive irritability. She reports feeling "more relaxed than I was before." Denies panic. She reports some anxiety when thinking about managing return to work. She reports that her sleep is occ disturbed due to children getting up during the night. She reports that she normally goes to bed between 9 pm and 10:30 and awakens 4-5 am. Has not been needing Trazodone prn as often and usually only one tab. Some increased interest in things. Denies SI.  ? ?She reports "memory has gotten a lot better" since last visit and is better able to articulate her thoughts and find words. "I feel better about my memory than I have in awhile." She reports that she has forgotten a few things and is trying to use reminders and to-do lists. Occ forgetting what she went into a room to do. She reports that her concentration is usually worse during premenstrual cycle. She reports that she has difficulty recalling what she has read and is reading slower than usual.  Noticed her mind was wandering less during recent meditation session.  ? ?Waiting to start Nexplanon at the start of her menstrual cycle. She reports that she has been trying to take norethindrone more consistently and reports that she has only missed one dose.  ? ?Reports 46 yo has some behavioral issues.  ? ?Past Psychiatric Medication Trials: ?Wellbutrin- allergic reaction. ?Sertraline ?Prozac ?Buspar ?Lamictal- Some improvement in depressive s/s outside of pre-menstrual time ?Latuda- Caused increased glucose and cholesterol levels. Caused sleepiness ?Abilify- helpful for depression. Wt. Gain.  ?Risperdal ?Geodon ?Adderall XR ?Vyvanse ?Concerta- Ineffective (short duration and minimal improvement) ?Modafinil ?Melatonin- Ineffective ?Trazodone ?Doxepin- not helpful for sleep initiation ? ?AIMS   ? ?[place] Office Visit from 10/29/2021 in [place] Visit from 07/23/2021 in [place] Visit from 08/25/2020 in [place] Visit from 11/13/2019 in [place]  ?AIMS Total Score 0 0 0 0  ? ?  ? ?PHQ2-9   ? ?Flowsheet Row Nutrition from 07/16/2020 in [facility]  ?PHQ-2 Total Score 0  ? ?  ?  ? ?Review of Systems:  ?Review of Systems  ?Musculoskeletal:  Negative for gait problem.  ?Neurological:  Negative for tremors.  ?Psychiatric/Behavioral:    ?     Please refer to HPI  ? ?Medications: I have reviewed the patient's current medications. ? ?Current Outpatient Medications  ?Medication Sig Dispense Refill  ? B Complex Vitamins (VITAMIN-B COMPLEX PO) Take by mouth.    ?  brexpiprazole (REXULTI) 2 MG TABS tablet Take 1 tablet (2 mg total) by mouth daily. 90 tablet 1  ? ferrous sulfate 325 (65 FE) MG tablet Take 325 mg by mouth daily with breakfast.    ? ibuprofen (ADVIL) 200 MG tablet Take 200 mg by mouth every 6 (six) hours as needed.    ? LYSINE PO Take by mouth.    ? metFORMIN (GLUCOPHAGE-XR) 500 MG  24 hr tablet Take 2 tablets (1,000 mg total) by mouth every morning. 180 tablet 3  ? Misc Natural Products (NEURIVA PO) Take by mouth.    ? Multiple Minerals-Vitamins (CALCIUM-MAGNESIUM-ZINC-D3 PO) Take by mouth.    ? Multiple Vitamins-Minerals (MULTIVITAMIN WOMEN PO) Take by mouth.    ? norethindrone (MICRONOR) 0.35 MG tablet Take 1 tablet by mouth daily.    ? traZODone (DESYREL) 100 MG tablet Take 1.5-2 tabs po QHS prn insomnia 180 tablet 0  ? UNABLE TO FIND Med Name: Combat Cravings    ? vitamin E 100 UNIT capsule Take 100 Units by mouth daily.    ? ALBUTEROL SULFATE IN Inhale into the lungs.    ? lisdexamfetamine (VYVANSE) 70 MG capsule Take 1 capsule (70 mg total) by mouth daily. 30 capsule 0  ? [START ON 12/26/2021] lisdexamfetamine (VYVANSE) 70 MG capsule Take 1 capsule (70 mg total) by mouth daily. 30 capsule 0  ? [START ON 11/28/2021] lisdexamfetamine (VYVANSE) 70 MG capsule Take 1 capsule (70 mg total) by mouth daily. 30 capsule 0  ? lithium carbonate 150 MG capsule Take 1 capsule (150 mg total) by mouth at bedtime. 30 capsule 2  ? [START ON 11/23/2021] modafinil (PROVIGIL) 200 MG tablet Take 1 tablet in the morning and mid-day 60 tablet 2  ? nabumetone (RELAFEN) 750 MG tablet Take 750 mg by mouth daily as needed.  (Patient not taking: Reported on 08/12/2021)    ? ?No current facility-administered medications for this visit.  ? ? ?Medication Side Effects: None ? ?Allergies:  ?Allergies  ?Allergen Reactions  ? Wellbutrin Xl [Bupropion] Hives  ? ? ?Past Medical History:  ?Diagnosis Date  ? Asthma   ? Bursitis   ? Diabetes mellitus, type II ([place])   ? Elevated cholesterol   ? Fatigue   ? H/O: depression   ? Migraines   ? Pica   ? ? ?Past Medical History, Surgical history, Social history, and Family history were reviewed and updated as appropriate.  ? ?Please see review of systems for further details on the patient's review from today.  ? ?Objective:  ? ?Physical Exam:  ?BP 121/77   Pulse 82  ? ?Physical  Exam ?Constitutional:   ?   General: She is not in acute distress. ?Musculoskeletal:     ?   General: No deformity.  ?Neurological:  ?   Mental Status: She is alert and oriented to person, place, and time.  ?   Coordination: Coordination normal.  ?Psychiatric:     ?   Attention and Perception: Attention and perception normal. She does not perceive auditory or visual hallucinations.     ?   Mood and Affect: Mood is anxious. Mood is not depressed. Affect is not labile, blunt, angry or inappropriate.     ?   Speech: Speech normal.     ?   Behavior: Behavior normal.     ?   Thought Content: Thought content normal. Thought content is not paranoid or delusional. Thought content does not include homicidal or suicidal ideation. Thought content does  not include homicidal or suicidal plan.     ?   Cognition and Memory: Cognition and memory normal.     ?   Judgment: Judgment normal.  ?   Comments: Insight intact  ? ? ?Lab Review:  ?No results found for: NA, K, CL, CO2, GLUCOSE, BUN, CREATININE, CALCIUM, PROT, ALBUMIN, AST, ALT, ALKPHOS, BILITOT, GFRNONAA, GFRAA ? ?   ?Component Value Date/Time  ? WBC 12.8 (H) 05/30/2009 0528  ? RBC 2.82 (L) 05/30/2009 0528  ? HGB 8.2 DELTA CHECK NOTED (L) 05/30/2009 0528  ? HCT 24.3 (L) 05/30/2009 0528  ? PLT 188 05/30/2009 0528  ? MCV 86.3 05/30/2009 0528  ? MCHC 33.8 05/30/2009 0528  ? RDW 14.8 05/30/2009 0528  ? ? ?No results found for: POCLITH, LITHIUM  ? ?No results found for: PHENYTOIN, PHENOBARB, VALPROATE, CBMZ  ? ?.res ?Assessment: Plan:   ? ?Recommend return to work on 11/16/21 with modified work schedule of 32 hours a week. Recommend  that she be moved to a lateral position with email or chat options instead of phone.  ?Will continue current plan of care without changes since she reports some recent improvements in mood, anxiety, and concentration.  ?Agree with plan to follow-up with obgyn to initiate Nexplanon as recommended by obgyn to possibly help stabilize hormone levels and  minimize PMDD s/s.  ?Continue Rexulti 2 mg po qd for mood symptoms. ?Continue Lithium 300 mg po QHS for mood s/s. ?Continue Vyvanse 70 mg po qd for ADHD.  ?Continue Modafinil 200 mg po BID for excessive fatigue and so

## 2021-11-16 ENCOUNTER — Telehealth: Payer: Self-pay | Admitting: Psychiatry

## 2021-11-16 NOTE — Telephone Encounter (Signed)
Sheila James from Dr. Encarnacion Chu LVM @ 12:51p.  He would like a return call from [place] regarding the pt. He is reviewing her disability claim. ? ?His confidential number is [phone] ?M-F 9-5 Eastern time ?

## 2021-11-16 NOTE — Telephone Encounter (Signed)
Received Disability forms from [place]. Placed in Traci's box 5/16 ?

## 2021-11-17 ENCOUNTER — Ambulatory Visit (INDEPENDENT_AMBULATORY_CARE_PROVIDER_SITE_OTHER): Payer: No Typology Code available for payment source | Admitting: Addiction (Substance Use Disorder)

## 2021-11-17 DIAGNOSIS — F3281 Premenstrual dysphoric disorder: Secondary | ICD-10-CM

## 2021-11-17 NOTE — Telephone Encounter (Signed)
Pt said that her company removed remote option. Section on the form is there any work restrictions we need to put that she needs to work from home. 32 hours and switch from phone to email or chat. ?

## 2021-11-17 NOTE — Progress Notes (Signed)
?      [facility] Counselor/Therapist Progress Note ? ?Patient ID: GERRY HEAPHY, MRN: [id number],   ? ?Date: 11/17/2021 ? ?Time Spent: 73mn ? ?Treatment Type: Individual Therapy ? ?Reported Symptoms: panic, depression.  ? ?Mental Status Exam: ? ?Appearance:   Casual     ?Behavior:  Appropriate and Sharing  ?Motor:  Normal   ?Speech/Language:   Clear and Coherent  ?Affect:  Appropriate and Congruent  ?Mood:  anxious and depressed   ?Thought process:  normal  ?  ?Thought content:    Rumination  ?Sensory/Perceptual disturbances:    WNL  ?Orientation:  x4  ?Attention:  Good  ?Concentration:  Fair  ?Memory:  WNL  ?Fund of knowledge:   Good  ?Insight:    Good  ?Judgment:   Fair  ?Impulse Control:  Good  ? ?Risk Assessment: ?Danger to Self:  No ?Self-injurious Behavior: No ?Danger to Others: No ?Duty to Warn:no ?Physical Aggression / Violence:No  ?Access to Firearms a concern: No  ?Gang Involvement:No  ? ?Subjective: Client expressed some stressors at home with her sons and one of them has ODD and defies her and causes additional stress in her life when she cannot monitor their behavior. Therapist used MI to inquire about client's stress response. Client processed the anxiety she felt since they revoked her ability to work from home. Client reported having a panic attack and having to extend her leave to give her more time to talk with her doctor to ask for the accommodations letter to include working from home. Client having low to no motivation and still dealing with depression. Therapist used MI & CBT with client to challenge self-defeating thoughts that she cant "hold it all together" with her PMDD. Therapist assessed for client safety; Client denied SI/HI/AVH and reported no thoughts of hurting herself.  ? ?Interventions: Cognitive Behavioral Therapy, Motivational Interviewing, and RPT   ? ?Diagnosis: ?  ICD-10-CM   ?1. PMDD (premenstrual dysphoric disorder)  F32.81   ?  ? ? ?Plan of Care:  ?Client to return  for weekly therapy with KSammuel Cooper therapist, to review again in 6 months.  ?Client to engage in positive self talk and challenging negative internal ruminations and self talk causing client to be overly anxious and worried using CBT, on daily practice. ?Client to engage in mindfulness: ie body scans each eveneing to help process and discharge emotional distress & recognize emotions. ?Client to utilize BSP (brainspotting) with therapist to help client regulate their anxiety in a somatic- felt body sense way: (ie by working to reduce muscle tension, ruminations, increased heart rate, constant worrying and feeling "hyper" ) by decreasing anxiety by 33% in the next 6 months.  ?Client to prioritize sleep 8+ hours each week night AEB going to bed by 10pm each night.  ?  ?KBarnie Del LCSW, LCAS, CCTP, CCS-I, BSP ?

## 2021-11-17 NOTE — Telephone Encounter (Signed)
Please see message in regards to disability claim.  ?

## 2021-11-19 NOTE — Telephone Encounter (Signed)
Attempted to return call to Dr. Encarnacion Chu. VM greeting instructed to leave pt information, length of treatment, diagnosis, recommended restrictions and accommodations. Left message that pt has been seen for several years and recommendation was made for her to return to work with reduced work schedule of 32 hours a week and ability to switch to responding to clients over email or chat instead of phone, and that she has been working remotely in the past and that she continue to be allowed to work remotely. Recommend accommodations since this has been helpful for her anxiety, concentration due to ADHD, and during PMDD flares when she experiences increased fatigue, difficulty getting out of bed, and depression. Recommended that he call office back with any further questions or need for additional information. Left contact information on VM message.

## 2021-11-25 DIAGNOSIS — Z0289 Encounter for other administrative examinations: Secondary | ICD-10-CM

## 2021-11-26 ENCOUNTER — Ambulatory Visit: Payer: No Typology Code available for payment source | Admitting: Psychiatry

## 2021-12-16 ENCOUNTER — Ambulatory Visit: Payer: No Typology Code available for payment source | Admitting: Psychiatry

## 2021-12-20 ENCOUNTER — Telehealth: Payer: Self-pay | Admitting: Psychiatry

## 2021-12-20 NOTE — Telephone Encounter (Signed)
Received fax from [place] re: Sheila James. Need completion of Provider Medical Accomodation Form. Placed in Traci's box.

## 2021-12-20 NOTE — Telephone Encounter (Signed)
Pt is scheduled 6/21 with Janett Billow. Will update paper work once seen.

## 2021-12-20 NOTE — Telephone Encounter (Signed)
Noted  

## 2021-12-22 ENCOUNTER — Ambulatory Visit: Payer: No Typology Code available for payment source | Admitting: Psychiatry

## 2021-12-22 ENCOUNTER — Encounter: Payer: Self-pay | Admitting: Psychiatry

## 2021-12-22 DIAGNOSIS — F902 Attention-deficit hyperactivity disorder, combined type: Secondary | ICD-10-CM | POA: Diagnosis not present

## 2021-12-22 DIAGNOSIS — F3281 Premenstrual dysphoric disorder: Secondary | ICD-10-CM | POA: Diagnosis not present

## 2021-12-22 DIAGNOSIS — F0634 Mood disorder due to known physiological condition with mixed features: Secondary | ICD-10-CM

## 2021-12-22 DIAGNOSIS — F411 Generalized anxiety disorder: Secondary | ICD-10-CM | POA: Diagnosis not present

## 2021-12-22 DIAGNOSIS — G4700 Insomnia, unspecified: Secondary | ICD-10-CM

## 2021-12-22 MED ORDER — LORAZEPAM 0.5 MG PO TABS: ORAL_TABLET | ORAL | 1 refills | Status: DC

## 2021-12-22 MED ORDER — TRAZODONE HCL 100 MG PO TABS: ORAL_TABLET | ORAL | 0 refills | Status: DC

## 2021-12-22 MED ORDER — LISDEXAMFETAMINE DIMESYLATE 70 MG PO CAPS: 70.0000 mg | ORAL_CAPSULE | Freq: Every day | ORAL | 0 refills | Status: DC

## 2021-12-22 MED ORDER — LITHIUM CARBONATE 150 MG PO CAPS: 300.0000 mg | ORAL_CAPSULE | Freq: Every day | ORAL | 1 refills | Status: DC

## 2021-12-22 MED ORDER — BREXPIPRAZOLE 3 MG PO TABS: 3.0000 mg | ORAL_TABLET | Freq: Every day | ORAL | 1 refills | Status: DC

## 2021-12-22 NOTE — Progress Notes (Signed)
Sheila James [id number] 1975-07-30 46 y.o.  Subjective:   Patient ID:  Sheila James is a 46 y.o. (DOB 04-11-1976) female.  Chief Complaint:  Chief Complaint  Patient presents with   Panic Attack   Anxiety   Other    Mood disturbance   Follow-up    ADHD, Insomnia    Anxiety Patient reports no palpitations.     Sheila James presents to the office today for follow-up of anxiety, mood disturbance, ADHD, and insomnia.   She reports that she was having panic attacks and was unable to leave the house. "My anxiety has evidently been higher than I thought." She reports that yesterday she got ready to go to work and when she went to leave she was unable to bring herself to open the door. She reports that she tried deep breathing and her heart was racing and HR was above 100. She reports that she then used meditation. She reports that anxiety with leaving home has resulted in tardiness. She had another day where she was unable to get out of the car when she drove to work.   She reports that her mood was "a little spicy" last week with some irritability. She reports "for the most part I have been happy." She notices some occasional mood lability. Reports that she was crying at work in response to frustration. She reports she has had some periods of depression. She reports some difficulty with energy and motivation and attributes this to ADHD. She reports difficulty getting started on tasks. Needing to use organizer to stay on task. Not sleeping well. Waking up around 3-5 am. Describes being easily awakened by her children. Appetite has been "better." Eating more than she normally would recently. Has had less time to meal prep. Denies SI.   She was not able to get Vyvanse for a period of time due to pharmacy running out. She reports that she had severe difficulty with concentration when she was not able to take Vyvanse. She reports that one day she forgot to take her  medication.   Reports that she has been seeing another therapist virtually to help with ADHD s/s.   She reports that since last visit she received a call from supervisor who told her that she could not borrow against her PTO, she had to come into the office to work, and that she was not eligible to use voluntary time off. Schedule was also changed to 9:30am- 6:30 pm. "I feel like she took away all my lifeboats." She was told that she needs to renew FMLA accommodations.  She reports that her appointments for her and her children are closer to her home than work. Returned to work on 11/29/21.   Got Nexplanon 2 weeks ago. She reports that PMDD s/s had a shorter duration during her last cycle and did not lat 14 days. She reports that PMDD s/s have been better over the last few months compared to previous months.   Modafinil filled on 6/13. Vyvanse 70 filled on 6/1.  Past Psychiatric Medication Trials: Wellbutrin- allergic reaction. Sertraline Prozac Buspar Lamictal- Some improvement in depressive s/s outside of pre-menstrual time Latuda- Caused increased glucose and cholesterol levels. Caused sleepiness Abilify- helpful for depression. Wt. Gain.  Risperdal Geodon Adderall XR Vyvanse Concerta- Ineffective (short duration and minimal improvement) Modafinil Melatonin- Ineffective Trazodone Doxepin- not helpful for sleep initiation    [place] Office Visit from 12/22/2021 in [place] Visit from  10/29/2021 in [place] Visit from 07/23/2021 in [place] Visit from 08/25/2020 in [place] Visit from 11/13/2019 in [place] Total Score 0 0 0 0 0      PHQ2-9    Flowsheet Row Nutrition [facility]  PHQ-2 Total Score 0        Review of Systems:  Review of Systems  Cardiovascular:  Negative for  palpitations.  Musculoskeletal:  Negative for gait problem.  Neurological:  Negative for tremors.  Psychiatric/Behavioral:         Please refer to HPI    Medications: I have reviewed the patient's current medications.  Current Outpatient Medications  Medication Sig Dispense Refill   ALBUTEROL SULFATE IN Inhale into the lungs.     B Complex Vitamins (VITAMIN-B COMPLEX PO) Take by mouth.     etonogestrel (NEXPLANON) 68 MG IMPL implant 1 each by Subdermal route once.     ferrous sulfate 325 (65 FE) MG tablet Take 325 mg by mouth daily with breakfast.     ibuprofen (ADVIL) 200 MG tablet Take 200 mg by mouth every 6 (six) hours as needed.     LORazepam (ATIVAN) 0.5 MG tablet Take 1/2- tab po BID prn 30 tablet 1   LYSINE PO Take by mouth.     metFORMIN (GLUCOPHAGE-XR) 500 MG 24 hr tablet Take 2 tablets (1,000 mg total) by mouth every morning. 180 tablet 3   modafinil (PROVIGIL) 200 MG tablet Take 1 tablet in the morning and mid-day 60 tablet 2   Multiple Minerals-Vitamins (CALCIUM-MAGNESIUM-ZINC-D3 PO) Take by mouth.     Multiple Vitamins-Minerals (MULTIVITAMIN WOMEN PO) Take by mouth.     UNABLE TO FIND Med Name: Dynamic Brain     vitamin E 100 UNIT capsule Take 100 Units by mouth daily.     Brexpiprazole (REXULTI) 3 MG TABS Take 1 tablet (3 mg total) by mouth daily. 30 tablet 1   glimepiride (AMARYL) 2 MG tablet Take 2 mg by mouth daily.     lisdexamfetamine (VYVANSE) 70 MG capsule Take 1 capsule (70 mg total) by mouth daily. 30 capsule 0   [START ON 01/27/2022] lisdexamfetamine (VYVANSE) 70 MG capsule Take 1 capsule (70 mg total) by mouth daily. 30 capsule 0   [START ON 12/30/2021] lisdexamfetamine (VYVANSE) 70 MG capsule Take 1 capsule (70 mg total) by mouth daily. 30 capsule 0   lithium carbonate 150 MG capsule Take 2 capsules (300 mg total) by mouth at bedtime. 60 capsule 1   nabumetone (RELAFEN) 750 MG tablet Take 750 mg by mouth daily as needed.  (Patient not taking: Reported on  08/12/2021)     traZODone (DESYREL) 100 MG tablet Take 1.5-2 tabs po QHS prn insomnia 180 tablet 0   No current facility-administered medications for this visit.    Medication Side Effects: None  Allergies:  Allergies  Allergen Reactions   Wellbutrin Xl [Bupropion] Hives    Past Medical History:  Diagnosis Date   Asthma    Bursitis    Diabetes mellitus, type II ([place])    Elevated cholesterol    Fatigue    H/O: depression    Migraines    Pica     Past Medical History, Surgical history, Social history, and Family history were reviewed and updated as appropriate.   Please see review of systems for further details on the patient's review from today.   Objective:   Physical  Exam:  There were no vitals taken for this visit.  Physical Exam Constitutional:      General: She is not in acute distress. Musculoskeletal:        General: No deformity.  Neurological:     Mental Status: She is alert and oriented to person, place, and time.     Coordination: Coordination normal.  Psychiatric:        Attention and Perception: Attention and perception normal. She does not perceive auditory or visual hallucinations.        Mood and Affect: Mood is anxious. Mood is not depressed. Affect is not labile, blunt, angry or inappropriate.        Speech: Speech normal.        Behavior: Behavior normal.        Thought Content: Thought content normal. Thought content is not paranoid or delusional. Thought content does not include homicidal or suicidal ideation. Thought content does not include homicidal or suicidal plan.        Cognition and Memory: Cognition and memory normal.        Judgment: Judgment normal.     Comments: Insight intact     Lab Review:  No results found for: "NA", "K", "CL", "CO2", "GLUCOSE", "BUN", "CREATININE", "CALCIUM", "PROT", "ALBUMIN", "AST", "ALT", "ALKPHOS", "BILITOT", "GFRNONAA", "GFRAA"     Component Value Date/Time   WBC 12.8 (H) 05/30/2009 0528   RBC 2.82  (L) 05/30/2009 0528   HGB 8.2 DELTA CHECK NOTED (L) 05/30/2009 0528   HCT 24.3 (L) 05/30/2009 0528   PLT 188 05/30/2009 0528   MCV 86.3 05/30/2009 0528   MCHC 33.8 05/30/2009 0528   RDW 14.8 05/30/2009 0528    No results found for: "POCLITH", "LITHIUM"   No results found for: "PHENYTOIN", "PHENOBARB", "VALPROATE", "CBMZ"   .res Assessment: Plan:    Pt seen for 30 minutes and time spent discussing treatment plan. Discussed potential benefits, risks, and side effects of increasing Rexulti to 3 mg po qd to further improve mood symptoms and also anxiety. Discussed that Rexulti is not approved for anxiety and this is an off-label indicated. Pt agrees to increase in [place].  Discussed potential benefits, risk, and side effects of benzodiazepines to include potential risk of tolerance and dependence, as well as possible drowsiness.  Advised patient not to drive if experiencing drowsiness and to take lowest possible effective dose to minimize risk of dependence and tolerance. Will start Lorazepam 0.5 mg 1/2-1 tab po BID prn panic.  Discussed continuing to monitor PMDD s/s after recently re-starting Nexplanon. Continue Lithium 300 mg po QHS for mood symptoms.  Continue Vyvanse 70 mg po qd for ADHD.  Continue Modafinil 200 mg po BID for excessive fatigue. Continue Trazodone 100 mg 1.5-2 tabs po QHS prn insomnia.  Will renew FMLA accommodations. Recommend the following: -Ability to work from home -32-35 hour work week -Allow voluntary time off when available -Consider moving to chat/email position if/when available Recommend continuing psychotherapy.  Pt to follow-up with this provider in 4 weeks or sooner if clinically indicated.  Patient advised to contact office with any questions, adverse effects, or acute worsening in signs and symptoms.  [place] was seen today for panic attack, anxiety, other and follow-up.  Diagnoses and all orders for this visit:  Generalized anxiety disorder -      LORazepam (ATIVAN) 0.5 MG tablet; Take 1/2- tab po BID prn  Mood disorder due to known physiological condition with mixed features -     Brexpiprazole (  REXULTI) 3 MG TABS; Take 1 tablet (3 mg total) by mouth daily. -     lithium carbonate 150 MG capsule; Take 2 capsules (300 mg total) by mouth at bedtime.  PMDD (premenstrual dysphoric disorder) -     Brexpiprazole (REXULTI) 3 MG TABS; Take 1 tablet (3 mg total) by mouth daily. -     lithium carbonate 150 MG capsule; Take 2 capsules (300 mg total) by mouth at bedtime.  Attention deficit hyperactivity disorder (ADHD), combined type -     lisdexamfetamine (VYVANSE) 70 MG capsule; Take 1 capsule (70 mg total) by mouth daily. -     lisdexamfetamine (VYVANSE) 70 MG capsule; Take 1 capsule (70 mg total) by mouth daily.  Insomnia, unspecified type -     traZODone (DESYREL) 100 MG tablet; Take 1.5-2 tabs po QHS prn insomnia     Please see After Visit Summary for patient specific instructions.  Future Appointments  Date Time Provider [place]  01/20/2022 11:00 AM Barnie Del,  CP-CP None  01/21/2022  3:00 PM Thayer Headings, PMHNP CP-CP None  02/01/2022 10:00 AM Barnie Del, LCSW CP-CP None  02/15/2022 11:00 AM Barnie Del, LCSW CP-CP None  03/01/2022  1:00 PM Barnie Del, LCSW CP-CP None  03/15/2022  1:00 PM Barnie Del, LCSW CP-CP None    No orders of the defined types were placed in this encounter.   -------------------------------

## 2021-12-22 NOTE — Telephone Encounter (Signed)
Noted will update her forms

## 2021-12-22 NOTE — Telephone Encounter (Signed)
Pt seen today and documented recommended accommodations.

## 2021-12-23 ENCOUNTER — Ambulatory Visit: Payer: No Typology Code available for payment source | Admitting: Psychiatry

## 2021-12-27 ENCOUNTER — Telehealth: Payer: Self-pay | Admitting: Psychiatry

## 2021-12-28 NOTE — Telephone Encounter (Signed)
Form updated and given to Shanda Bumps to sign.

## 2021-12-29 NOTE — Telephone Encounter (Signed)
Error

## 2022-01-20 ENCOUNTER — Ambulatory Visit: Payer: No Typology Code available for payment source | Admitting: Addiction (Substance Use Disorder)

## 2022-01-21 ENCOUNTER — Encounter: Payer: Self-pay | Admitting: Psychiatry

## 2022-01-21 ENCOUNTER — Ambulatory Visit: Payer: No Typology Code available for payment source | Admitting: Psychiatry

## 2022-01-21 VITALS — BP 113/73 | HR 77

## 2022-01-21 DIAGNOSIS — R5383 Other fatigue: Secondary | ICD-10-CM

## 2022-01-21 DIAGNOSIS — F3281 Premenstrual dysphoric disorder: Secondary | ICD-10-CM

## 2022-01-21 DIAGNOSIS — F0634 Mood disorder due to known physiological condition with mixed features: Secondary | ICD-10-CM | POA: Diagnosis not present

## 2022-01-21 DIAGNOSIS — F411 Generalized anxiety disorder: Secondary | ICD-10-CM | POA: Diagnosis not present

## 2022-01-21 DIAGNOSIS — F902 Attention-deficit hyperactivity disorder, combined type: Secondary | ICD-10-CM

## 2022-01-21 DIAGNOSIS — G4700 Insomnia, unspecified: Secondary | ICD-10-CM

## 2022-01-21 MED ORDER — MODAFINIL 200 MG PO TABS: ORAL_TABLET | ORAL | 5 refills | Status: DC

## 2022-01-21 MED ORDER — LISDEXAMFETAMINE DIMESYLATE 70 MG PO CAPS: 70.0000 mg | ORAL_CAPSULE | Freq: Every day | ORAL | 0 refills | Status: DC

## 2022-01-21 MED ORDER — TRAZODONE HCL 100 MG PO TABS: ORAL_TABLET | ORAL | 2 refills | Status: DC

## 2022-01-21 MED ORDER — REXULTI 3 MG PO TABS: 3.0000 mg | ORAL_TABLET | Freq: Every day | ORAL | 1 refills | Status: DC

## 2022-01-21 MED ORDER — LITHIUM CARBONATE 150 MG PO CAPS: 300.0000 mg | ORAL_CAPSULE | Freq: Every day | ORAL | 2 refills | Status: DC

## 2022-01-21 NOTE — Progress Notes (Signed)
Sheila James [id number] 1976/04/02 46 y.o.  Subjective:   Patient ID:  Sheila James is a 46 y.o. (DOB [birth year]) female.  Chief Complaint: No chief complaint on file.   HPI Sheila James presents to the office today for follow-up of mood disturbance, anxiety, and insomnia.   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 She reports "my mood has improved." Reports less mood lability. She reports that in the past she was "annoyed" with family. She reports that she is no longer "snapping at them" as much. She reports that she has had some mild-moderate depression at times in response to some stressors. Motivation has been good. She has been using a planner to help with organization and productivity. She reports that she has been having less difficulty getting out of bed. Had period of fatigue and needed more sleep. She felt more energized after she slept more. Sleeping well overall. Sleeping 6.5 hours on average. Does not sleep as soundly when menstruating. Appetite has been ok overall. She reports difficulty with concentration at times. She reports some occ word finding difficulties. Does not feel as focused as she did 1.5-2 years ago. Has been enjoying some breaks. Denies SI. "I'm looking forward to the coming months."   She reports that her PMDD symptoms have improved some since the Nexplanon. Mood was down for a few days and depression was not as severe compared to the past. She reports that her body felt "heavy" but she was able to function and get out of bed. Noticed some fatigue around menstrual cycle. She reports that she did not have a period in June and has had a period in July.  Noticed food cravings right before the onset of menses.   She reports that her HR accommodation was approved several days ago. She is working 6.5 hours a day. Her schedule is adjusted  temporarily for 2 months and was told she will need to return to her regular work schedule in October. She is now able to work from home.   She has been using an ap to track her mood and learn psycho-education.   Vyvanse last filled 01/02/22 Lorazepam last filled 12/28/21. Modafinil last filled 12/14/21. Filled x 2.   Past Psychiatric Medication Trials: Wellbutrin- allergic reaction. Sertraline Prozac Buspar Lamictal- Some improvement in depressive s/s outside of pre-menstrual time Latuda- Caused increased glucose and cholesterol levels. Caused sleepiness Abilify- helpful for depression. Wt. Gain.  Risperdal Geodon Adderall XR Vyvanse Concerta- Ineffective (short duration and minimal improvement) Modafinil Melatonin- Ineffective Trazodone Doxepin- not helpful for sleep initiation  [place] Office Visit from 12/22/2021 in [place] Visit from 10/29/2021 in [place] Visit from 07/23/2021 in [place] Visit from 08/25/2020 in [place] Visit from 11/13/2019 in [place] Total Score 0 0 0 0 0      PHQ2-9    Flowsheet Row Nutrition [facility]16/2020 [facility]  PHQ-2 Total Score 0        Review of Systems:  Review of Systems  Musculoskeletal:  Negative for gait problem.  Neurological:  Negative for tremors.  Psychiatric/Behavioral:         Please refer to HPI    Medications: I have reviewed the patient's current medications.  Current Outpatient Medications  Medication Sig  Dispense Refill  . ALBUTEROL SULFATE IN Inhale into the lungs.    . B Complex Vitamins (VITAMIN-B COMPLEX PO) Take by mouth.    . Brexpiprazole (REXULTI) 3 MG TABS Take 1 tablet (3 mg total) by mouth daily. 30 tablet 1  . etonogestrel (NEXPLANON) 68 MG IMPL implant 1 each by Subdermal route once.    . ferrous sulfate 325 (65 FE) MG  tablet Take 325 mg by mouth daily with breakfast.    . glimepiride (AMARYL) 2 MG tablet Take 2 mg by mouth daily.    Marland Kitchen ibuprofen (ADVIL) 200 MG tablet Take 200 mg by mouth every 6 (six) hours as needed.    Marland Kitchen lisdexamfetamine (VYVANSE) 70 MG capsule Take 1 capsule (70 mg total) by mouth daily. 30 capsule 0  . [START ON 01/27/2022] lisdexamfetamine (VYVANSE) 70 MG capsule Take 1 capsule (70 mg total) by mouth daily. 30 capsule 0  . lisdexamfetamine (VYVANSE) 70 MG capsule Take 1 capsule (70 mg total) by mouth daily. 30 capsule 0  . lithium carbonate 150 MG capsule Take 2 capsules (300 mg total) by mouth at bedtime. 60 capsule 1  . LORazepam (ATIVAN) 0.5 MG tablet Take 1/2- tab po BID prn 30 tablet 1  . LYSINE PO Take by mouth.    . metFORMIN (GLUCOPHAGE-XR) 500 MG 24 hr tablet Take 2 tablets (1,000 mg total) by mouth every morning. 180 tablet 3  . modafinil (PROVIGIL) 200 MG tablet Take 1 tablet in the morning and mid-day 60 tablet 2  . Multiple Minerals-Vitamins (CALCIUM-MAGNESIUM-ZINC-D3 PO) Take by mouth.    . Multiple Vitamins-Minerals (MULTIVITAMIN WOMEN PO) Take by mouth.    . nabumetone (RELAFEN) 750 MG tablet Take 750 mg by mouth daily as needed.  (Patient not taking: Reported on 08/12/2021)    . traZODone (DESYREL) 100 MG tablet Take 1.5-2 tabs po QHS prn insomnia 180 tablet 0  . UNABLE TO FIND Med Name: Dynamic Brain    . vitamin E 100 UNIT capsule Take 100 Units by mouth daily.     No current facility-administered medications for this visit.    Medication Side Effects: None  Allergies:  Allergies  Allergen Reactions  . Wellbutrin Xl [Bupropion] Hives    Past Medical History:  Diagnosis Date  . Asthma   . Bursitis   . Diabetes mellitus, type II ([place])   . Elevated cholesterol   . Fatigue   . H/O: depression   . Migraines   . Pica     Past Medical History, Surgical history, Social history, and Family history were reviewed and updated as appropriate.   Please see review  of systems for further details on the patient's review from today.   Objective:   Physical Exam:  There were no vitals taken for this visit.  Physical Exam  Lab Review:  No results found for: "NA", "K", "CL", "CO2", "GLUCOSE", "BUN", "CREATININE", "CALCIUM", "PROT", "ALBUMIN", "AST", "ALT", "ALKPHOS", "BILITOT", "GFRNONAA", "GFRAA"     Component Value Date/Time   WBC 12.8 (H) 05/30/2009 0528   RBC 2.82 (L) 05/30/2009 0528   HGB 8.2 DELTA CHECK NOTED (L) 05/30/2009 0528   HCT 24.3 (L) 05/30/2009 0528   PLT 188 05/30/2009 0528   MCV 86.3 05/30/2009 0528   MCHC 33.8 05/30/2009 0528   RDW 14.8 05/30/2009 0528    No results found for: "POCLITH", "LITHIUM"   No results found for: "PHENYTOIN", "PHENOBARB", "VALPROATE", "CBMZ"   .res Assessment: Plan:    There are no diagnoses linked  to this encounter.   Please see After Visit Summary for patient specific instructions.  Future Appointments  Date Time Provider [place]  02/01/2022 10:00 AM Barnie Del, LCSW CP-CP None  02/15/2022 11:00 AM Barnie Del, LCSW [facility] None  03/01/2022  1:00 PM Barnie Del, LCSW CP[facility]CP None  03/15/2022  1:00 PM Barnie Del, LCSW CP-CP None    No orders of the defined types were placed in this encounter.   -------------------------------

## 2022-02-01 ENCOUNTER — Ambulatory Visit: Payer: No Typology Code available for payment source | Admitting: Addiction (Substance Use Disorder)

## 2022-02-02 ENCOUNTER — Telehealth: Payer: Self-pay | Admitting: Psychiatry

## 2022-02-02 ENCOUNTER — Other Ambulatory Visit: Payer: Self-pay

## 2022-02-02 DIAGNOSIS — F902 Attention-deficit hyperactivity disorder, combined type: Secondary | ICD-10-CM

## 2022-02-02 NOTE — Telephone Encounter (Signed)
Next visit is 03/24/22. Sheila James states that her Vyvanse 70 mg is on back order and pharmacy doesn't know when they will get it in. Can she be put on another similar medication? She took her last Vyvanse yesterday. Pharmacy is:  [id number] - [place], [place] - [address] AT [place]  Phone:  [phone]  Fax:  [phone]

## 2022-02-02 NOTE — Telephone Encounter (Signed)
Would you please call [facility] and see if they have it? She has been more stable recently and would prefer not to make a change. She may also want to check with [facility] or [facility].

## 2022-02-02 NOTE — Telephone Encounter (Signed)
Checked multiple pharmacies. Found at [facility], had enough to fill her Rx. Pended to [place].

## 2022-02-02 NOTE — Telephone Encounter (Signed)
Vyvanse 70 mg is on backorder at most, if not all, pharmacies. I called patient's pharmacy and they are out of 60 mg also, but do have 50 mg. Patient is asking for something similar to Vyvanse, but Adderall and methylphenidate are also in short supply. Please advise.

## 2022-02-03 ENCOUNTER — Other Ambulatory Visit (HOSPITAL_COMMUNITY): Payer: Self-pay

## 2022-02-03 MED ORDER — LISDEXAMFETAMINE DIMESYLATE 70 MG PO CAPS
70.0000 mg | ORAL_CAPSULE | Freq: Every day | ORAL | 0 refills | Status: DC
  Filled 2022-02-03: qty 30, 30d supply, fill #0

## 2022-02-04 ENCOUNTER — Other Ambulatory Visit (HOSPITAL_COMMUNITY): Payer: Self-pay

## 2022-02-15 ENCOUNTER — Ambulatory Visit: Payer: No Typology Code available for payment source | Admitting: Addiction (Substance Use Disorder)

## 2022-03-01 ENCOUNTER — Ambulatory Visit: Payer: No Typology Code available for payment source | Admitting: Addiction (Substance Use Disorder)

## 2022-03-05 ENCOUNTER — Other Ambulatory Visit: Payer: Self-pay | Admitting: Psychiatry

## 2022-03-05 DIAGNOSIS — F902 Attention-deficit hyperactivity disorder, combined type: Secondary | ICD-10-CM

## 2022-03-08 NOTE — Telephone Encounter (Signed)
Pharmacy does not have in stock.

## 2022-03-11 ENCOUNTER — Other Ambulatory Visit (HOSPITAL_COMMUNITY): Payer: Self-pay

## 2022-03-15 ENCOUNTER — Ambulatory Visit: Payer: No Typology Code available for payment source | Admitting: Addiction (Substance Use Disorder)

## 2022-03-24 ENCOUNTER — Ambulatory Visit: Payer: No Typology Code available for payment source | Admitting: Psychiatry

## 2022-04-20 ENCOUNTER — Telehealth: Payer: Self-pay | Admitting: Psychiatry

## 2022-04-20 ENCOUNTER — Other Ambulatory Visit: Payer: Self-pay

## 2022-04-20 ENCOUNTER — Encounter: Payer: Self-pay | Admitting: Psychiatry

## 2022-04-20 ENCOUNTER — Ambulatory Visit: Payer: No Typology Code available for payment source | Admitting: Psychiatry

## 2022-04-20 DIAGNOSIS — F3281 Premenstrual dysphoric disorder: Secondary | ICD-10-CM | POA: Diagnosis not present

## 2022-04-20 DIAGNOSIS — F0634 Mood disorder due to known physiological condition with mixed features: Secondary | ICD-10-CM

## 2022-04-20 DIAGNOSIS — F902 Attention-deficit hyperactivity disorder, combined type: Secondary | ICD-10-CM

## 2022-04-20 DIAGNOSIS — G4700 Insomnia, unspecified: Secondary | ICD-10-CM | POA: Diagnosis not present

## 2022-04-20 MED ORDER — VYVANSE 70 MG PO CAPS: 70.0000 mg | ORAL_CAPSULE | Freq: Every day | ORAL | 0 refills | Status: DC

## 2022-04-20 MED ORDER — LITHIUM CARBONATE 300 MG PO CAPS: ORAL_CAPSULE | ORAL | 2 refills | Status: DC

## 2022-04-20 MED ORDER — LISDEXAMFETAMINE DIMESYLATE 70 MG PO CAPS: 70.0000 mg | ORAL_CAPSULE | Freq: Every day | ORAL | 0 refills | Status: DC

## 2022-04-20 MED ORDER — TRAZODONE HCL 100 MG PO TABS: ORAL_TABLET | ORAL | 2 refills | Status: DC

## 2022-04-20 NOTE — Telephone Encounter (Signed)
Pended.

## 2022-04-20 NOTE — Telephone Encounter (Signed)
Patient was seen today she states that pharmacy is out of generic Vyvanse and would like the name brand sent instead as they have it in stock. Ph: [phone] Appt 12/18 Pharmacy [facility] [address] [place], [place]

## 2022-04-20 NOTE — Progress Notes (Signed)
Sheila James [id number] 1975-08-03 46 y.o.  Subjective:   Patient ID:  Sheila James is a 46 y.o. (DOB 04/05/1976) female.  Chief Complaint:  Chief Complaint  Patient presents with   Depression   Anxiety   ADHD    HPI Sheila James presents to the office today for follow-up of mood disturbance, anxiety, ADHD, and insomnia.  She reports, "I haven't been doing well at all." She reports that she had a difficult "cycle in August like I had PMDD the whole time." She reports that she "literally had to work from bed" due to low energy and back pain. She reports doing the "bare minimum" to include taking children to school. Reports that she was   She reports frequent anxiety attacks and increased HR (105 vs. Usual resting HR of 70). She reports that at times Lorazepam was minimally effective and she was unable to work afterwards. She reports that she has severe financial stressors.   She reports that she has had chronic fatigue and states that she has anemia. She reports low energy and motivation. 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 She reports difficulty with concentration and distractibility. She reports that she is frequently on her phone and feels "bored" without it. She noticed that she did not take HS meds last week, to include Trazodone, and that her sleep was reduced. She reports that she has been intentional about taking HS meds over the last 5-6 days and her sleep has improved over the last week. Appetite has been "fine." Had some recent stomach issues and was eating different foods to try to minimize GI issues. Denies impulsive or risky behavior.   Reports that she is "not interested in anything, including living." She reports that she has had suicidal thoughts without intent, stating that she does not want to leave her children without a parent. "I am only here for them."   She  reports that she has not filled Rexulti due to finances and has not taken it since late July. She reports that she did not have Lithium in August and was irritable and said things she regretted. She reports that she was not able to obtain Vyvanse for a month (most of September).   She reports that the person that had greed to transport children to and from school backed out of the arrangement. She reports work denied her request to modify her work schedule. She has had to use PTO and voluntary time off (VTO) to pick sons up from school. She reports that she has also had to use PTO and VTO due to her symptoms. She reports that using VTO results in less income.   Vyvanse last filled 03/21/22. Lorazepam last filled 12/28/21.   Past Psychiatric Medication Trials: Wellbutrin- allergic reaction. Sertraline Prozac Buspar Lamictal- Some improvement in depressive s/s outside of pre-menstrual time Latuda- Caused increased glucose and cholesterol levels. Caused sleepiness Abilify- helpful for depression. Wt. Gain.  Risperdal Geodon Adderall XR Vyvanse Concerta- Ineffective (short duration and minimal improvement) Modafinil Melatonin- Ineffective Trazodone Doxepin- not helpful for sleep initiation   [place] Office Visit from 04/20/2022 in [place] Visit from 01/21/2022 in [place] Visit from 12/22/2021 in [place] Visit from 10/29/2021 in [place] Visit from 07/23/2021 in [facility]  AIMS Total Score 0 0 0 0 0  PHQ2-9    Flowsheet Row Nutrition [facility]  PHQ-2 Total Score 0        Review of Systems:  Review of Systems  Constitutional:  Positive for fatigue.  Gastrointestinal:  Positive for constipation.  Endocrine:       Hot flashes  Musculoskeletal:  Positive for back pain. Negative for gait  problem.  Neurological:  Negative for tremors.       Reports having frequent HA's around recent menstrual cycles  Psychiatric/Behavioral:         Please refer to HPI    Medications: I have reviewed the patient's current medications.  Current Outpatient Medications  Medication Sig Dispense Refill   ALBUTEROL SULFATE IN Inhale into the lungs.     B Complex Vitamins (VITAMIN-B COMPLEX PO) Take by mouth.     docusate sodium (COLACE) 100 MG capsule Take 100 mg by mouth daily as needed.     etonogestrel (NEXPLANON) 68 MG IMPL implant 1 each by Subdermal route once.     ferrous sulfate 325 (65 FE) MG tablet Take 325 mg by mouth daily with breakfast.     glimepiride (AMARYL) 2 MG tablet Take 2 mg by mouth daily.     ibuprofen (ADVIL) 200 MG tablet Take 200 mg by mouth every 6 (six) hours as needed.     LORazepam (ATIVAN) 0.5 MG tablet Take 1/2- tab po BID prn 30 tablet 1   modafinil (PROVIGIL) 200 MG tablet Take 1 tablet in the morning and mid-day 60 tablet 5   Multiple Minerals-Vitamins (CALCIUM-MAGNESIUM-ZINC-D3 PO) Take by mouth.     vitamin E 100 UNIT capsule Take 100 Units by mouth daily.     Brexpiprazole (REXULTI) 3 MG TABS Take 1 tablet (3 mg total) by mouth daily. (Patient not taking: Reported on 04/20/2022) 90 tablet 1   [START ON 06/15/2022] lisdexamfetamine (VYVANSE) 70 MG capsule Take 1 capsule (70 mg total) by mouth daily. 30 capsule 0   [START ON 05/18/2022] lisdexamfetamine (VYVANSE) 70 MG capsule Take 1 capsule (70 mg total) by mouth daily. 30 capsule 0   lithium carbonate 300 MG capsule Take 2 capsules at bedtime 60 capsule 2   LYSINE PO Take by mouth.     metFORMIN (GLUCOPHAGE-XR) 500 MG 24 hr tablet Take 2 tablets (1,000 mg total) by mouth every morning. (Patient not taking: Reported on 04/20/2022) 180 tablet 3   Multiple Vitamins-Minerals (MULTIVITAMIN WOMEN PO) Take by mouth. (Patient not taking: Reported on 04/20/2022)     nabumetone (RELAFEN) 750 MG tablet Take 750 mg by  mouth daily as needed.  (Patient not taking: Reported on 08/12/2021)     traZODone (DESYREL) 100 MG tablet Take 1.5-2 tabs po QHS prn insomnia 60 tablet 2   VYVANSE 70 MG capsule Take 1 capsule (70 mg total) by mouth daily. 30 capsule 0   No current facility-administered medications for this visit.    Medication Side Effects: None  Allergies:  Allergies  Allergen Reactions   Wellbutrin Xl [Bupropion] Hives    Past Medical History:  Diagnosis Date   Asthma    Bursitis    Diabetes mellitus, type II ([place])    Elevated cholesterol    Fatigue    H/O: depression    Migraines    Pica     Past Medical History, Surgical history, Social history, and Family history were reviewed and updated as appropriate.   Please see review of systems for further details on the  patient's review from today.   Objective:   Physical Exam:  There were no vitals taken for this visit.  Physical Exam Constitutional:      General: She is not in acute distress. Musculoskeletal:        General: No deformity.  Neurological:     Mental Status: She is alert and oriented to person, place, and time.     Coordination: Coordination normal.  Psychiatric:        Attention and Perception: Attention and perception normal. She does not perceive auditory or visual hallucinations.        Mood and Affect: Mood is anxious and depressed. Affect is not labile, blunt, angry or inappropriate.        Speech: Speech normal.        Behavior: Behavior normal.        Thought Content: Thought content is not paranoid or delusional. Thought content does not include homicidal ideation. Thought content does not include homicidal plan.        Cognition and Memory: Cognition and memory normal.        Judgment: Judgment normal.     Comments: Insight intact Suicidal thoughts without intent or plan     Lab Review:  No results found for: "NA", "K", "CL", "CO2", "GLUCOSE", "BUN", "CREATININE", "CALCIUM", "PROT", "ALBUMIN", "AST",  "ALT", "ALKPHOS", "BILITOT", "GFRNONAA", "GFRAA"     Component Value Date/Time   WBC 12.8 (H) 05/30/2009 0528   RBC 2.82 (L) 05/30/2009 0528   HGB 8.2 DELTA CHECK NOTED (L) 05/30/2009 0528   HCT 24.3 (L) 05/30/2009 0528   PLT 188 05/30/2009 0528   MCV 86.3 05/30/2009 0528   MCHC 33.8 05/30/2009 0528   RDW 14.8 05/30/2009 0528    No results found for: "POCLITH", "LITHIUM"   No results found for: "PHENYTOIN", "PHENOBARB", "VALPROATE", "CBMZ"   .res Assessment: Plan:    Pt seen for 45 minutes and time spent discussing treatment plan and response to medications. Discussed re-starting Rexulti since this has been helpful for her depression in the past. Discussed that Rexulti would need to be gradually re-started to minimize risk of side effects. Pt provided samples to re-initiate Rexulti. Pt advised to take Rexulti 0.5 mg daily for one week, then increase to 1 mg daily for 11 days, then 2 mg daily for  3 weeks, then 3 mg daily.  Also discussed that Lithium has been somewhat effective for her mood symptoms and that she may benefit from increase in dose to 600 mg po QHS since she is currently on a lower dose. Pt agrees to increase Lithium to two 300 mg capsules at bedtime to improve depression and stabilize mood.  Continue Vyvanse 70 mg po qd for ADHD.  Continue Ativan 0.5 mg 1/2-1 tab po BID prn anxiety.  Continue Modafinil 200 mg in the morning and mid-day for excessive daytime somnolence.  Continue Trazodone 150-200 mg po QHS prn insomnia.  Pt to follow-up in 2 months or sooner if clinically indicated.  Patient advised to contact office with any questions, adverse effects, or acute worsening in signs and symptoms.   [place] was seen today for depression, anxiety and adhd.  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 -     lisdexamfetamine (VYVANSE) 70 MG capsule; Take 1 capsule (70 mg total) by mouth daily.     Please see After Visit Summary for patient specific instructions.  Future Appointments  Date Time Provider [place]  06/20/2022  8:30 AM Thayer Headings, PMHNP CP-CP None    No orders of the defined types were placed in this encounter.   -------------------------------

## 2022-04-20 NOTE — Patient Instructions (Signed)
Take Rexulti 0.5 mg daily for one week, then increase to 1 mg daily for 11 days ( using tabs from 14-day starter pack box #1 and 7-day starter pack box #2), then 2 mg daily for  3 weeks, then 3 mg daily.   Increase Lithium to two 300 mg capsules at bedtime.  Continue all other medications without changes.

## 2022-05-24 IMAGING — MR MR HEAD WO/W CM
8 of 10 series · 34 of 48 positions shown · IV contrast (gadavist)
Comparison: None.

CLINICAL DATA: Frontal headaches for 3 months. History of remote
head injury.

EXAM:
MRI HEAD WITHOUT AND WITH CONTRAST
TECHNIQUE: Multiplanar, multiecho pulse sequences of the brain and surrounding
structures were obtained without and with intravenous contrast.
CONTRAST:  10mL GADAVIST GADOBUTROL 1 MMOL/ML IV SOLN

[Series 2: T1 · sagittal · 5.0mm · 0.45mm/px · 2 of 23 slices shown]
[im 1/23]
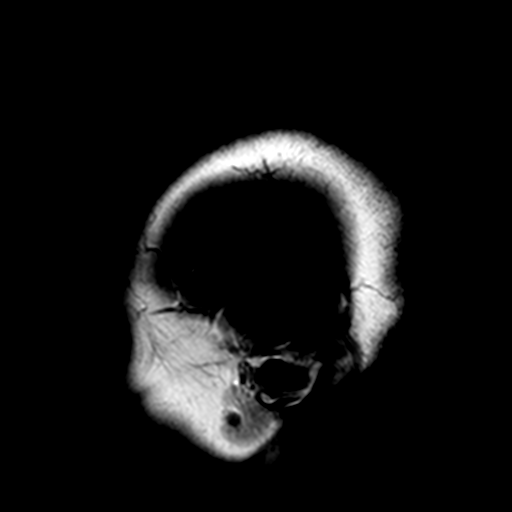
[im 23/23]
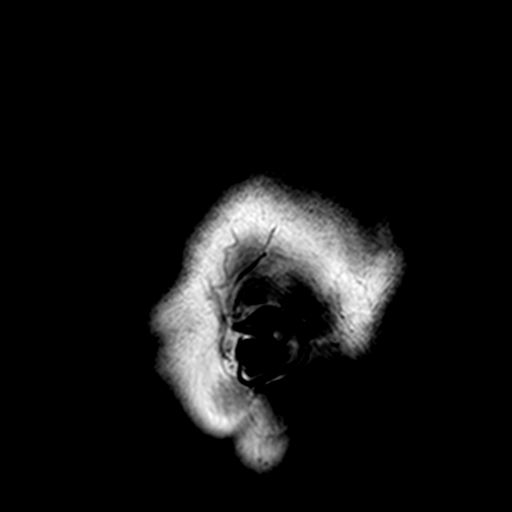

[Series 3: DWI · axial · 3.0mm · 2.19mm/px · z∈[-41,+114]mm · 10 of 96 slices shown (1 of 2)]
[im 1/96]
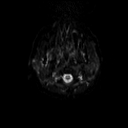
[im 11/96]
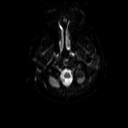
[im 22/96]
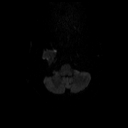
[im 32/96]
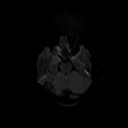
[im 43/96]
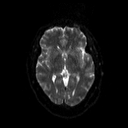
[im 53/96]
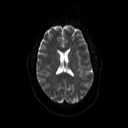
[im 64/96]
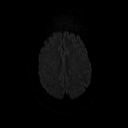
[im 74/96]
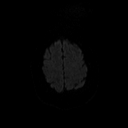
[im 85/96]
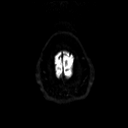
[im 96/96]
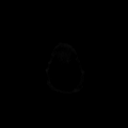

[Series 4: DWI · axial · 3.0mm · 2.19mm/px · z∈[-41,+114]mm · 5 of 48 slices shown (2 of 2)]
[im 1/48]
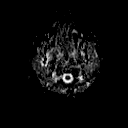
[im 12/48]
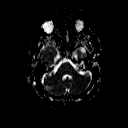
[im 24/48]
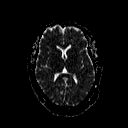
[im 36/48]
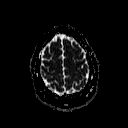
[im 48/48]
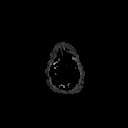

[Series 5: T2 · axial · 5.0mm · 0.45mm/px · z∈[-48,+120]mm · 3 of 25 slices shown (1 of 2)]
[im 1/25]
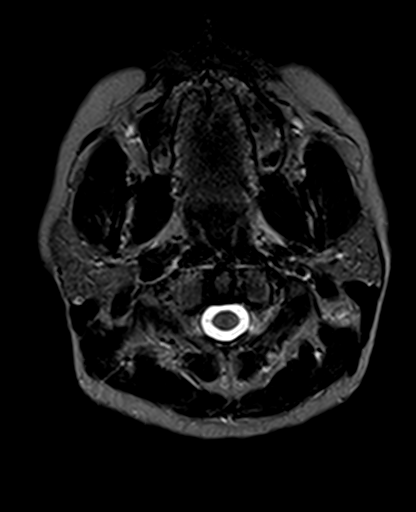
[im 13/25]
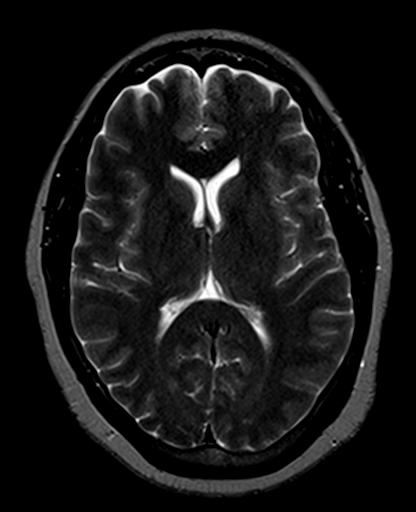
[im 25/25]
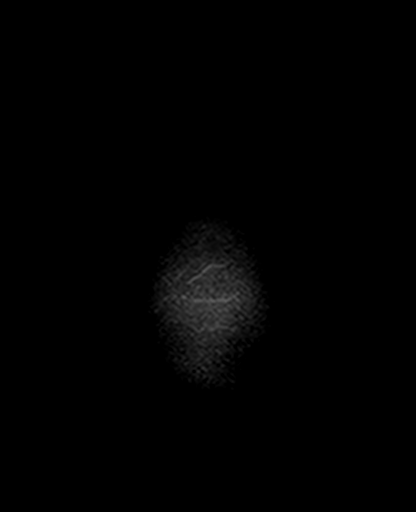

[Series 6: T2 · axial · 5.0mm · 0.45mm/px · z∈[-48,+120]mm · 3 of 25 slices shown (2 of 2)]
[im 1/25]
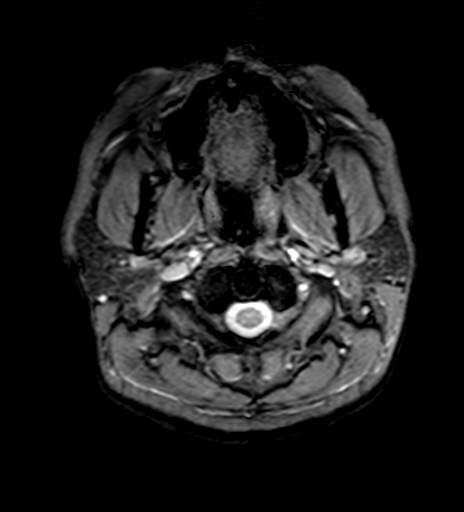
[im 13/25]
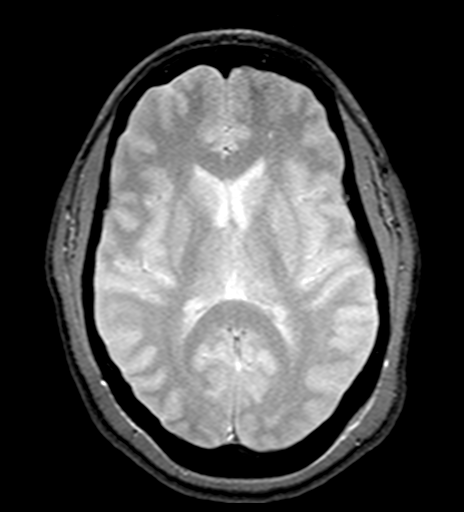
[im 25/25]
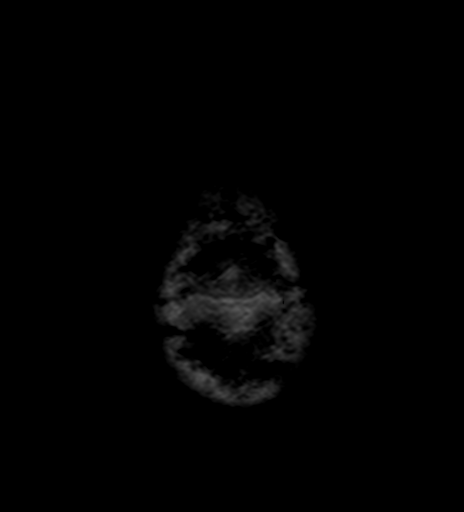

[Series 7: FLAIR · axial · 3.0mm · 0.45mm/px · z∈[-42,+114]mm · 3 of 27 slices shown]
[im 1/27]
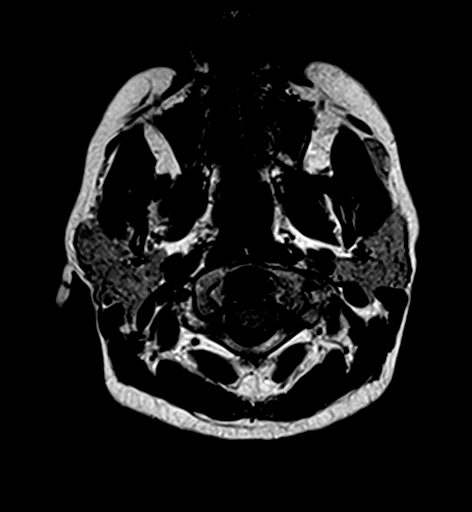
[im 14/27]
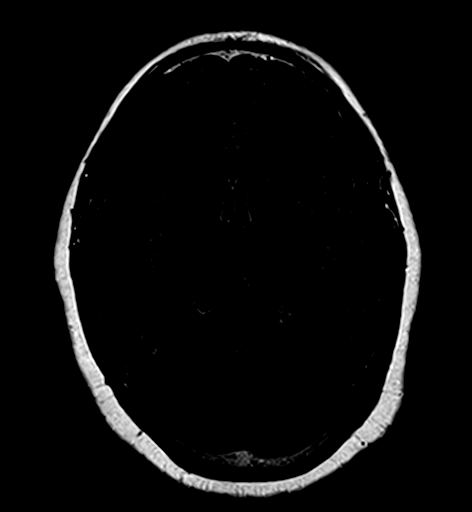
[im 27/27]
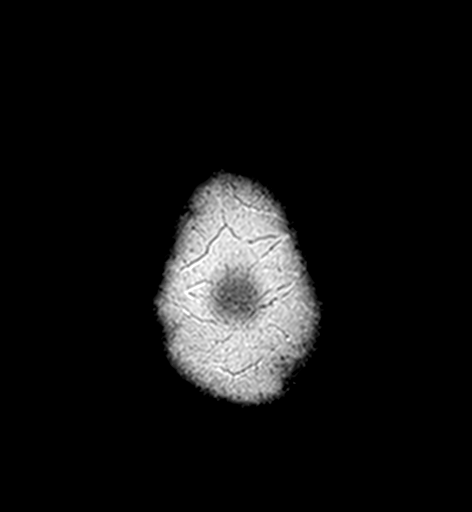

[Series 9: T2 post-contrast · coronal · 5.0mm · 0.45mm/px · 4 of 32 slices shown]
[im 1/32]
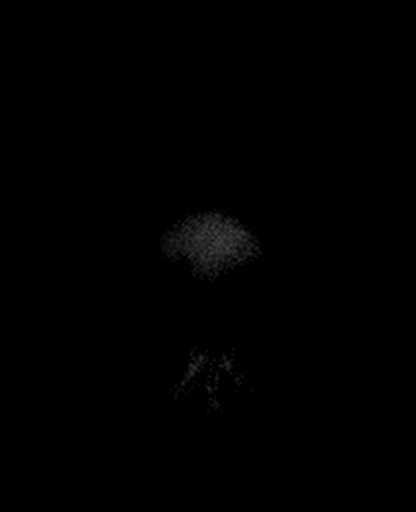
[im 11/32]
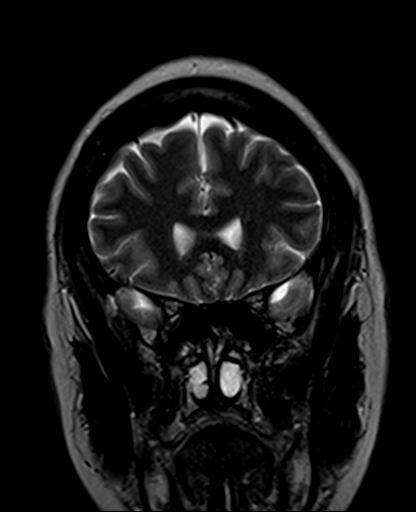
[im 21/32]
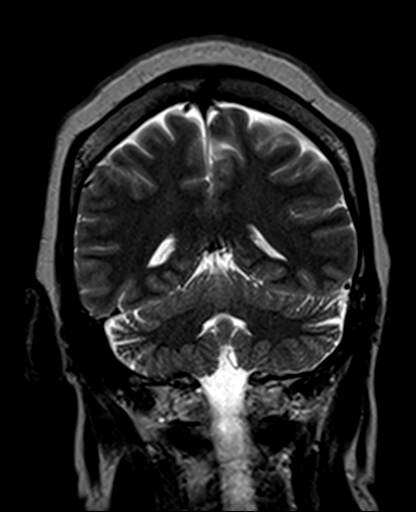
[im 32/32]
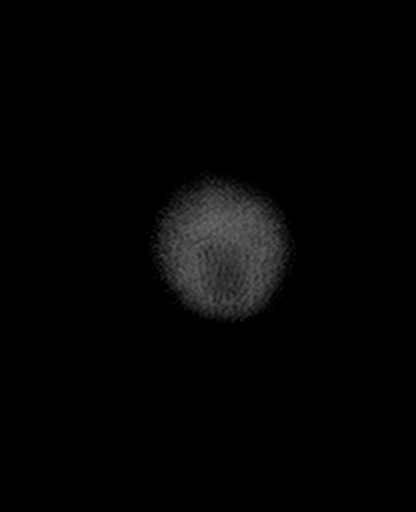

[Series 11: T1 post-contrast · coronal · 5.0mm · 0.45mm/px · 4 of 32 slices shown]
[im 1/32]
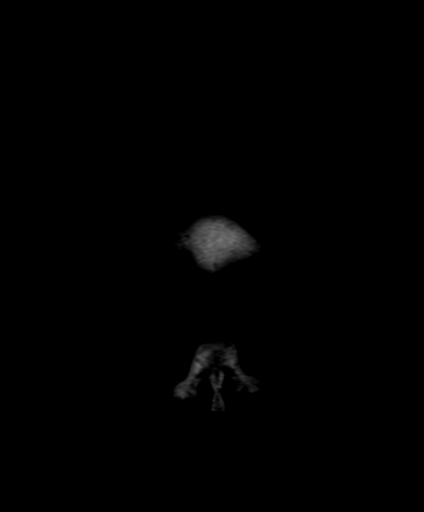
[im 11/32]
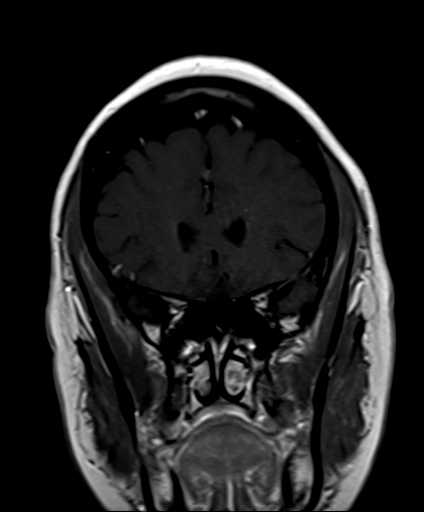
[im 21/32]
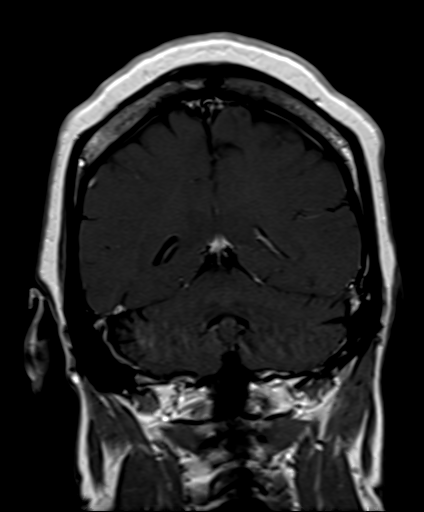
[im 32/32]
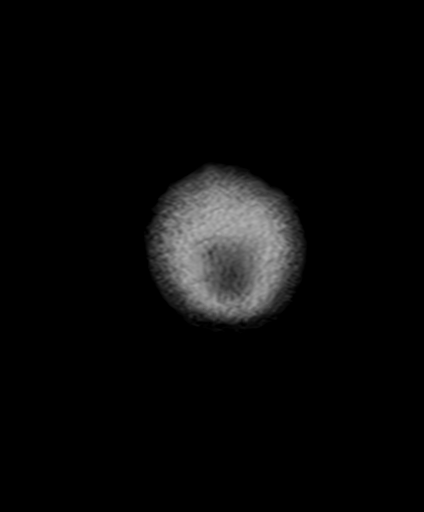

[34 of 48 positions shown; findings below may reference images not displayed]

FINDINGS: Brain: There is no evidence of an acute infarct, intracranial
hemorrhage, mass, midline shift, or extra-axial fluid collection.
The ventricles and sulci are normal. The brain is normal in signal.
A developmental venous anomaly is incidentally noted in the left
frontal lobe.

Vascular: Major intracranial vascular flow voids are preserved.

Skull and upper cervical spine: Unremarkable bone marrow signal.

Sinuses/Orbits: Unremarkable orbits. Minimal mucosal thickening in
the paranasal sinuses. Clear mastoid air cells.

Other: None.
IMPRESSION: Unremarkable appearance of the brain aside from an incidental left
frontal developmental venous anomaly.

## 2022-06-16 ENCOUNTER — Telehealth: Payer: Self-pay

## 2022-06-16 NOTE — Telephone Encounter (Signed)
Prior Authorization submitted for Rexulti 3 mg with [facility], pending response from cover my meds

## 2022-06-16 NOTE — Telephone Encounter (Signed)
Prior Approval received effective through 06/17/2023 with [facility] Rexulti 3 mg, PA# [id number]

## 2022-06-20 ENCOUNTER — Ambulatory Visit: Payer: No Typology Code available for payment source | Admitting: Psychiatry

## 2022-06-20 ENCOUNTER — Encounter: Payer: Self-pay | Admitting: Psychiatry

## 2022-06-20 DIAGNOSIS — G4700 Insomnia, unspecified: Secondary | ICD-10-CM | POA: Diagnosis not present

## 2022-06-20 DIAGNOSIS — F0634 Mood disorder due to known physiological condition with mixed features: Secondary | ICD-10-CM | POA: Diagnosis not present

## 2022-06-20 DIAGNOSIS — F3281 Premenstrual dysphoric disorder: Secondary | ICD-10-CM | POA: Diagnosis not present

## 2022-06-20 DIAGNOSIS — F902 Attention-deficit hyperactivity disorder, combined type: Secondary | ICD-10-CM

## 2022-06-20 DIAGNOSIS — F411 Generalized anxiety disorder: Secondary | ICD-10-CM

## 2022-06-20 DIAGNOSIS — R5383 Other fatigue: Secondary | ICD-10-CM

## 2022-06-20 MED ORDER — LORAZEPAM 0.5 MG PO TABS: ORAL_TABLET | ORAL | 1 refills | Status: DC

## 2022-06-20 MED ORDER — REXULTI 3 MG PO TABS: 3.0000 mg | ORAL_TABLET | Freq: Every day | ORAL | 1 refills | Status: DC

## 2022-06-20 MED ORDER — TRAZODONE HCL 100 MG PO TABS: ORAL_TABLET | ORAL | 1 refills | Status: DC

## 2022-06-20 MED ORDER — MODAFINIL 200 MG PO TABS: ORAL_TABLET | ORAL | 5 refills | Status: DC

## 2022-06-20 MED ORDER — LITHIUM CARBONATE 300 MG PO CAPS: 600.0000 mg | ORAL_CAPSULE | Freq: Every day | ORAL | 0 refills | Status: DC

## 2022-06-20 MED ORDER — LISDEXAMFETAMINE DIMESYLATE 70 MG PO CAPS: 70.0000 mg | ORAL_CAPSULE | Freq: Every day | ORAL | 0 refills | Status: DC

## 2022-06-20 NOTE — Progress Notes (Signed)
AIGNER HORSEMAN [id number] 04-Sep-1975 46 y.o.  Subjective:   Patient ID:  Sheila James is a 46 y.o. (DOB June 18, 1976) female.  Chief Complaint:  Chief Complaint  Patient presents with   ADHD   Follow-up    Mood disturbance, anxiety, insomnia, PMDD    HPI Sheila James presents to the office today for follow-up of Bipolar Disorder, PMDD, anxiety, and ADHD.  She reports, "I've been doing better." She reports that she has been taking Rexulti consistently until she recently needed a PA for it. She reports that her mood was stable while taking Rexulti consistently. She has noticed tearfulness and suicidal thoughts since running out of Rexulti in the last few days.   She reports that her mood was stable without any severe depression or irritability. She reports occasional irritability in response to teenage sons not doing what they have been told to do. Denies impulsive or risky behavior. She reports that she has been having menstrual irregularities and she has difficulty tracking symptoms. She reports excessive somnolence after her menstrual cycle. She notices insomnia during her menstrual cycle. She reports difficulty staying awake, especially when she does not have Modafinil. She has had to clock out on a few occasions due to excessive somnolence and needing to nap.   She reports that she had anxiety in response to financial stressors. She was having difficulty sleeping due to back pain from mattress. She bought a new bed and sleep has improved. She has started going to the gyn and this has been helpful for stretching her out. She reports that motivation is ok and has been getting up early when not having PMDD s/s.   She reports some difficulty with concentration and misread eviction/payment notice from landlord. Forgetting to write things down and requests reminders from others. Denies SI other than with being off [place].   Reports that bunny has been helpful for sons'  emotional needs. Children have been having some health and emotional needs.   Has not taken Ativan prn recently.   Vyvanse last filled 06/07/22. Modafinil last filled 06/07/22. Lorazepam last filled 12/28/21.   Past Psychiatric Medication Trials: Wellbutrin- allergic reaction. Sertraline Prozac Buspar Lamictal- Some improvement in depressive s/s outside of pre-menstrual time Latuda- Caused increased glucose and cholesterol levels. Caused sleepiness Abilify- helpful for depression. Wt. Gain.  Risperdal Geodon Adderall XR Vyvanse Concerta- Ineffective (short duration and minimal improvement) Modafinil Melatonin- Ineffective Trazodone Doxepin- not helpful for sleep initiation       [place] Office Visit from 06/20/2022 in [place] Visit from 04/20/2022 in [place] Visit from 01/21/2022 in [place] Visit from 12/22/2021 in [place] Visit from 10/29/2021 in [place] Total Score 0 0 0 0 0      PHQ2-9    Flowsheet Row Nutrition [facility]16/2020 [facility]  PHQ-2 Total Score 0        Review of Systems:  Review of Systems  Genitourinary:        Menstrual irregularities  Musculoskeletal:  Negative for gait problem.  Allergic/Immunologic: Positive for environmental allergies.  Neurological:  Negative for tremors.  Psychiatric/Behavioral:         Please refer to HPI    Medications: I have reviewed the patient's current medications.  Current Outpatient Medications  Medication Sig Dispense Refill   B Complex Vitamins (VITAMIN-B COMPLEX PO) Take by mouth.  docusate sodium (COLACE) 100 MG capsule Take 100 mg by mouth daily as needed.     etonogestrel (NEXPLANON) 68 MG IMPL implant 1 each by Subdermal route once.     ferrous sulfate 325 (65 FE) MG tablet Take 325 mg by mouth daily with breakfast.      glimepiride (AMARYL) 2 MG tablet Take 2 mg by mouth daily.     Multiple Minerals-Vitamins (CALCIUM-MAGNESIUM-ZINC-D3 PO) Take by mouth.     nabumetone (RELAFEN) 750 MG tablet Take 750 mg by mouth daily as needed.     ALBUTEROL SULFATE IN Inhale into the lungs.     Brexpiprazole (REXULTI) 3 MG TABS Take 1 tablet (3 mg total) by mouth daily. 90 tablet 1   ibuprofen (ADVIL) 200 MG tablet Take 200 mg by mouth every 6 (six) hours as needed.     [START ON 07/05/2022] lisdexamfetamine (VYVANSE) 70 MG capsule Take 1 capsule (70 mg total) by mouth daily. 30 capsule 0   [START ON 08/02/2022] lisdexamfetamine (VYVANSE) 70 MG capsule Take 1 capsule (70 mg total) by mouth daily. 30 capsule 0   [START ON 08/30/2022] lisdexamfetamine (VYVANSE) 70 MG capsule Take 1 capsule (70 mg total) by mouth daily. 30 capsule 0   lithium carbonate 300 MG capsule Take 2 capsules (600 mg total) by mouth at bedtime. 180 capsule 0   [START ON 07/18/2022] LORazepam (ATIVAN) 0.5 MG tablet Take 1/2- tab po BID prn 30 tablet 1   LYSINE PO Take by mouth.     metFORMIN (GLUCOPHAGE-XR) 500 MG 24 hr tablet Take 2 tablets (1,000 mg total) by mouth every morning. (Patient taking differently: Take 1,000 mg by mouth 2 (two) times daily with a meal.) 180 tablet 3   [START ON 08/02/2022] modafinil (PROVIGIL) 200 MG tablet Take 1 tablet in the morning and mid-day 60 tablet 5   Multiple Vitamins-Minerals (MULTIVITAMIN WOMEN PO) Take by mouth. (Patient not taking: Reported on 04/20/2022)     traZODone (DESYREL) 100 MG tablet Take 1.5-2 tabs po QHS prn insomnia 180 tablet 1   vitamin E 100 UNIT capsule Take 100 Units by mouth daily. (Patient not taking: Reported on 06/20/2022)     No current facility-administered medications for this visit.    Medication Side Effects: None  Allergies:  Allergies  Allergen Reactions   Wellbutrin Xl [Bupropion] Hives    Past Medical History:  Diagnosis Date   Asthma    Bursitis    Diabetes mellitus, type  II ([place])    Elevated cholesterol    Fatigue    H/O: depression    Migraines    Pica     Past Medical History, Surgical history, Social history, and Family history were reviewed and updated as appropriate.   Please see review of systems for further details on the patient's review from today.   Objective:   Physical Exam:  BP 122/79   Pulse 88   Physical Exam Constitutional:      General: She is not in acute distress. Musculoskeletal:        General: No deformity.  Neurological:     Mental Status: She is alert and oriented to person, place, and time.     Coordination: Coordination normal.  Psychiatric:        Attention and Perception: Attention and perception normal. She does not perceive auditory or visual hallucinations.        Mood and Affect: Mood is not depressed. Affect is not labile, blunt, angry or inappropriate.  Speech: Speech normal.        Behavior: Behavior normal.        Thought Content: Thought content normal. Thought content is not paranoid or delusional. Thought content does not include homicidal or suicidal ideation. Thought content does not include homicidal or suicidal plan.        Cognition and Memory: Cognition and memory normal.        Judgment: Judgment normal.     Comments: Insight intact Some anxiety in response to financial stress     Lab Review:  No results found for: "NA", "K", "CL", "CO2", "GLUCOSE", "BUN", "CREATININE", "CALCIUM", "PROT", "ALBUMIN", "AST", "ALT", "ALKPHOS", "BILITOT", "GFRNONAA", "GFRAA"     Component Value Date/Time   WBC 12.8 (H) 05/30/2009 0528   RBC 2.82 (L) 05/30/2009 0528   HGB 8.2 DELTA CHECK NOTED (L) 05/30/2009 0528   HCT 24.3 (L) 05/30/2009 0528   PLT 188 05/30/2009 0528   MCV 86.3 05/30/2009 0528   MCHC 33.8 05/30/2009 0528   RDW 14.8 05/30/2009 0528    No results found for: "POCLITH", "LITHIUM"   No results found for: "PHENYTOIN", "PHENOBARB", "VALPROATE", "CBMZ"   .res Assessment: Plan:    Pt  provided with samples of Rexulti 3 mg today to resume immediately since she reports worsening mood symptoms with SI with missed doses of Rexulti. She reports that she had a recent lapse in [place] while awaiting prior authorization and was informed by insurance that it was recently approved.  Will continue Rexulti 3 mg daily for mood symptoms.  Continue Lithium 600 mg po QHS for mood symptoms. She reports that she has had labs drawn at PCP's office in the last 3-6 months. Will request labs from PCP's office.  Continue Vyvanse 70 mg daily for ADHD. Continue Lorazepam 0.5 mg 1/2-1 tab po BID prn anxiety.  Continue Modafinil 200 mg in the morning and mid-day for excessive daytime somnolence and fatigue.  Continue Trazodone 100 mg 1.5-2 tabs po QHS prn insomnia.  Pt to follow-up in 3 months or sooner if clinically indicated.  Patient advised to contact office with any questions, adverse effects, or acute worsening in signs and symptoms.  Sheila James was seen today for adhd and follow-up.  Diagnoses and all orders for this visit:  PMDD (premenstrual dysphoric disorder) -     Brexpiprazole (REXULTI) 3 MG TABS; Take 1 tablet (3 mg total) by mouth daily. -     lithium carbonate 300 MG capsule; Take 2 capsules (600 mg total) by mouth at bedtime.  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 -     lithium carbonate 300 MG capsule; Take 2 capsules (600 mg total) by mouth at bedtime.  Attention deficit hyperactivity disorder (ADHD), combined type -     lisdexamfetamine (VYVANSE) 70 MG capsule; Take 1 capsule (70 mg total) by mouth daily. -     lisdexamfetamine (VYVANSE) 70 MG capsule; Take 1 capsule (70 mg total) by mouth daily. -     lisdexamfetamine (VYVANSE) 70 MG capsule; Take 1 capsule (70 mg total) by mouth daily.  Insomnia, unspecified type -     traZODone (DESYREL) 100 MG tablet; Take  1.5-2 tabs po QHS prn insomnia  Fatigue, unspecified type -     modafinil (PROVIGIL) 200 MG tablet; Take 1 tablet in the morning and mid-day  Generalized anxiety disorder -  LORazepam (ATIVAN) 0.5 MG tablet; Take 1/2- tab po BID prn     Please see After Visit Summary for patient specific instructions.  Future Appointments  Date Time Provider [place]  09/19/2022 10:00 AM Thayer Headings, PMHNP CP-CP None    No orders of the defined types were placed in this encounter.   -------------------------------

## 2022-06-21 ENCOUNTER — Encounter: Payer: Self-pay | Admitting: Family Medicine

## 2022-08-10 ENCOUNTER — Telehealth: Payer: Self-pay | Admitting: Psychiatry

## 2022-08-10 ENCOUNTER — Other Ambulatory Visit: Payer: Self-pay

## 2022-08-10 DIAGNOSIS — F902 Attention-deficit hyperactivity disorder, combined type: Secondary | ICD-10-CM

## 2022-08-10 MED ORDER — LISDEXAMFETAMINE DIMESYLATE 70 MG PO CAPS
70.0000 mg | ORAL_CAPSULE | Freq: Every day | ORAL | 0 refills | Status: DC
  Filled 2022-08-10: qty 30, 30d supply, fill #0

## 2022-08-10 NOTE — Telephone Encounter (Signed)
Pended.

## 2022-08-10 NOTE — Telephone Encounter (Signed)
Next visit is 09/19/22. Sheila James says that [facility] is out of Vyvanse and [facility] is on back order now. She would like the Vyvanse called in to:  [facility]   Phone: [phone]  Fax: [phone]   It is in stock at this location.

## 2022-08-11 ENCOUNTER — Other Ambulatory Visit (HOSPITAL_COMMUNITY): Payer: Self-pay

## 2022-08-30 ENCOUNTER — Other Ambulatory Visit (HOSPITAL_COMMUNITY): Payer: Self-pay

## 2022-09-12 ENCOUNTER — Encounter: Payer: Self-pay | Admitting: Addiction (Substance Use Disorder)

## 2022-09-19 ENCOUNTER — Ambulatory Visit: Payer: No Typology Code available for payment source | Admitting: Psychiatry

## 2022-09-19 ENCOUNTER — Encounter: Payer: Self-pay | Admitting: Psychiatry

## 2022-09-19 DIAGNOSIS — G4700 Insomnia, unspecified: Secondary | ICD-10-CM | POA: Diagnosis not present

## 2022-09-19 DIAGNOSIS — F0634 Mood disorder due to known physiological condition with mixed features: Secondary | ICD-10-CM

## 2022-09-19 DIAGNOSIS — F3281 Premenstrual dysphoric disorder: Secondary | ICD-10-CM | POA: Diagnosis not present

## 2022-09-19 DIAGNOSIS — F902 Attention-deficit hyperactivity disorder, combined type: Secondary | ICD-10-CM

## 2022-09-19 MED ORDER — REXULTI 3 MG PO TABS: 3.0000 mg | ORAL_TABLET | Freq: Every day | ORAL | 1 refills | Status: DC

## 2022-09-19 MED ORDER — TRAZODONE HCL 100 MG PO TABS: ORAL_TABLET | ORAL | 1 refills | Status: DC

## 2022-09-19 MED ORDER — LISDEXAMFETAMINE DIMESYLATE 70 MG PO CAPS: 70.0000 mg | ORAL_CAPSULE | Freq: Every day | ORAL | 0 refills | Status: DC

## 2022-09-19 MED ORDER — LITHIUM CARBONATE 300 MG PO CAPS: 600.0000 mg | ORAL_CAPSULE | Freq: Every day | ORAL | 1 refills | Status: DC

## 2022-09-19 NOTE — Progress Notes (Signed)
Sheila James [id number] [birth year] 47 y.o.  Subjective:   Patient ID:  Sheila James is a 47 y.o. (DOB 07-07-1975) female.  Chief Complaint:  Chief Complaint  Patient presents with   Follow-up    Bipolar Disorder, PMDD, anxiety, and ADHD    HPI Sheila James presents to the office today for follow-up of Bipolar Disorder, PMDD, anxiety, and ADHD.   She reports, "this month is a good month before this month, things weren't going well. "  She reports that she had COVID and was out of work for 2-3 weeks. She reports that she is coping with long COVID symptoms. She reports that yesterday she became short of breath after walking a much shorter duration than usual.   "My mood has been fine. I have not missed any of my pills." Denies depressed mood. Denies any irritability. She reports that she may have missed some doses while she had COVID. She reports less PMDD symptoms are less compared to the past. She reports that she increased her iron and notices PMDD sx's are improved. She reports that she has not been sleeping well. Reports that medication no longer puts her to sleep and sometimes will not fall asleep until 3-4 hours after taking medications. "Now, I think I am sleeping better just naturally. I don't think the medicine is working at all." She reports that she goes to bed around 9-10 pm and sleeping until about 5 am. She reports that her sleep is ok now, however she had a period of early morning awakenings. She reports that she is maintaining a consistent schedule and this has been helpful for her sleep. Denies impulsive or risky behavior. Energy and motivation have been improved.  "Concentration and focus are ok." She reports that she has been able to work. She reports that she has difficulty when there are lulls between calls. She uses and under-the-desk cycle machine when feeling restless. Some difficulty with memory. Appetite has been normal. Denies SI.   Has a new  desk for her work.   Seeing a therapist virtually through [facility].   She reports that she usually takes both Modafinil in the morning since she was occasionally falling asleep during the day with taking one Modafinil in the morning and mid-day.   Vyvanse last filled 08/14/22.  Modafinil last filled 08/14/22 x 2.  Lorazepam last filled 07/30/22  Past Psychiatric Medication Trials: Wellbutrin- allergic reaction. Sertraline Prozac Buspar Lamictal- Some improvement in depressive s/s outside of pre-menstrual time Latuda- Caused increased glucose and cholesterol levels. Caused sleepiness Abilify- helpful for depression. Wt. Gain.  Risperdal Geodon Adderall XR Vyvanse Concerta- Ineffective (short duration and minimal improvement) Modafinil Melatonin- Ineffective Trazodone Doxepin- not helpful for sleep initiation     [place] Office Visit from 09/19/2022 in [place] Office Visit from 06/20/2022 in [facility] Office Visit from 04/20/2022 in [facility] Office Visit from 01/21/2022 in [place] Office Visit from 12/22/2021 in [facility] Total Score 0 0 0 0 0      PHQ2-9    Flowsheet Row Nutrition from 07/16/2020 in [place] [facility] Total Score 0        Review of Systems:  Review of Systems  Musculoskeletal:  Negative for gait problem.  Neurological:  Negative for tremors.  Psychiatric/Behavioral:  Please refer to HPI   Creatinine 1.96 in Dec 2023   Medications: I have reviewed the patient's current medications.  Current Outpatient Medications  Medication Sig Dispense Refill   B Complex Vitamins (VITAMIN-B COMPLEX PO) Take by mouth.     cyclobenzaprine (FLEXERIL) 5 MG tablet 1 tablet Orally three times a day as needed for 7 days      etonogestrel (NEXPLANON) 68 MG IMPL implant 1 each by Subdermal route once.     ferrous sulfate 325 (65 FE) MG tablet Take 325 mg by mouth 2 (two) times daily with a meal.     glimepiride (AMARYL) 2 MG tablet Take 2 mg by mouth daily.     ibuprofen (ADVIL) 200 MG tablet Take 200 mg by mouth every 6 (six) hours as needed.     LORazepam (ATIVAN) 0.5 MG tablet Take 1/2- tab po BID prn 30 tablet 1   LYSINE PO Take by mouth.     metFORMIN (GLUCOPHAGE-XR) 500 MG 24 hr tablet Take 2 tablets (1,000 mg total) by mouth every morning. (Patient taking differently: Take 1,000 mg by mouth 2 (two) times daily with a meal.) 180 tablet 3   modafinil (PROVIGIL) 200 MG tablet Take 1 tablet in the morning and mid-day 60 tablet 5   Multiple Minerals-Vitamins (CALCIUM-MAGNESIUM-ZINC-D3 PO) Take by mouth.     Multiple Vitamins-Minerals (MULTIVITAMIN WOMEN PO) Take by mouth.     naproxen (EC NAPROSYN) 500 MG EC tablet Take 500 mg by mouth 2 (two) times daily with a meal.     Brexpiprazole (REXULTI) 3 MG TABS Take 1 tablet (3 mg total) by mouth daily. 90 tablet 1   docusate sodium (COLACE) 100 MG capsule Take 100 mg by mouth daily as needed.     lisdexamfetamine (VYVANSE) 70 MG capsule Take 1 capsule (70 mg total) by mouth daily. 30 capsule 0   [START ON 10/17/2022] lisdexamfetamine (VYVANSE) 70 MG capsule Take 1 capsule (70 mg total) by mouth daily. 30 capsule 0   [START ON 11/14/2022] lisdexamfetamine (VYVANSE) 70 MG capsule Take 1 capsule (70 mg total) by mouth daily. 30 capsule 0   lithium carbonate 300 MG capsule Take 2 capsules (600 mg total) by mouth at bedtime. 180 capsule 1   nabumetone (RELAFEN) 750 MG tablet Take 750 mg by mouth daily as needed. (Patient not taking: Reported on 09/19/2022)     traZODone (DESYREL) 100 MG tablet Take 1.5-2 tabs po QHS prn insomnia 180 tablet 1   vitamin E 100 UNIT capsule Take 100 Units by mouth daily. (Patient not taking: Reported on 06/20/2022)     No current  facility-administered medications for this visit.    Medication Side Effects: None  Allergies:  Allergies  Allergen Reactions   Wellbutrin Xl [Bupropion] Hives    Past Medical History:  Diagnosis Date   Asthma    Bursitis    Diabetes mellitus, type II ([place])    Elevated cholesterol    Fatigue    H/O: depression    Migraines    Pica     Past Medical History, Surgical history, Social history, and Family history were reviewed and updated as appropriate.   Please see review of systems for further details on the patient's review from today.   Objective:   Physical Exam:  BP 116/71   Pulse 75   Physical Exam Constitutional:      General: She is not in acute distress. Musculoskeletal:        General: No deformity.  Neurological:     Mental Status: She is alert and oriented to person, place, and time.     Coordination: Coordination normal.  Psychiatric:        Attention and Perception: Attention and perception normal. She does not perceive auditory or visual hallucinations.        Mood and Affect: Mood normal. Mood is not anxious or depressed. Affect is not labile, blunt, angry or inappropriate.        Speech: Speech normal.        Behavior: Behavior normal.        Thought Content: Thought content normal. Thought content is not paranoid or delusional. Thought content does not include homicidal or suicidal ideation. Thought content does not include homicidal or suicidal plan.        Cognition and Memory: Cognition and memory normal.        Judgment: Judgment normal.     Comments: Insight intact     Lab Review:  No results found for: "NA", "K", "CL", "CO2", "GLUCOSE", "BUN", "CREATININE", "CALCIUM", "PROT", "ALBUMIN", "AST", "ALT", "ALKPHOS", "BILITOT", "GFRNONAA", "GFRAA"     Component Value Date/Time   WBC 12.8 (H) 05/30/2009 0528   RBC 2.82 (L) 05/30/2009 0528   HGB 8.2 DELTA CHECK NOTED (L) 05/30/2009 0528   HCT 24.3 (L) 05/30/2009 0528   PLT 188 05/30/2009 0528    MCV 86.3 05/30/2009 0528   MCHC 33.8 05/30/2009 0528   RDW 14.8 05/30/2009 0528    No results found for: "POCLITH", "LITHIUM"   No results found for: "PHENYTOIN", "PHENOBARB", "VALPROATE", "CBMZ"   .res Assessment: Plan:   Will continue current plan of care since target signs and symptoms are well controlled without any tolerability issues. Continue Rexulti 3 mg po qd for depression.  Continue Lithium 600 mg po QHS for mood symptoms. Most recent creatinine level was WNL.   Continue Vyvanse 70 mg daily for ADHD. Continue Lorazepam 0.5 mg 1/2-1 tab po BID prn anxiety.  Continue Modafinil 400 mg in the morning for daytime somnolence and fatigue.  Continue Trazodone 100 mg 1.5-2 tabs po QHS prn insomnia.  Continue Lorazepam 0.5 mg 1/2-1 tab po BID prn anxiety.  Recommend continuing psychotherapy.  Pt to follow-up in 3 months or sooner if clinically indicated.  Patient advised to contact office with any questions, adverse effects, or acute worsening in signs and symptoms.   Cherrill was seen today for follow-up.  Diagnoses and all orders for this visit:  Attention deficit hyperactivity disorder (ADHD), combined type -     lisdexamfetamine (VYVANSE) 70 MG capsule; Take 1 capsule (70 mg total) by mouth daily. -     lisdexamfetamine (VYVANSE) 70 MG capsule; Take 1 capsule (70 mg total) by mouth daily. -     lisdexamfetamine (VYVANSE) 70 MG capsule; Take 1 capsule (70 mg total) by mouth daily.  PMDD (premenstrual dysphoric disorder) -     lithium carbonate 300 MG capsule; Take 2 capsules (600 mg total) by mouth at bedtime. -     Brexpiprazole (REXULTI) 3 MG TABS; Take 1 tablet (3 mg total) by mouth daily.  Mood disorder due to known physiological condition with mixed features -     lithium carbonate 300 MG capsule; Take 2 capsules (600 mg total) by mouth at bedtime. -     Brexpiprazole (REXULTI) 3 MG TABS; Take 1 tablet (3 mg total) by mouth daily.  Insomnia, unspecified type -      traZODone (DESYREL) 100 MG tablet; Take 1.5-2  tabs po QHS prn insomnia     Please see After Visit Summary for patient specific instructions.  Future Appointments  Date Time Provider [place]  12/20/2022 10:00 AM Thayer Headings, PMHNP [facility] None    No orders of the defined types were placed in this encounter.   -------------------------------

## 2022-12-20 ENCOUNTER — Ambulatory Visit (INDEPENDENT_AMBULATORY_CARE_PROVIDER_SITE_OTHER): Payer: No Typology Code available for payment source | Admitting: Psychiatry

## 2022-12-20 ENCOUNTER — Encounter: Payer: Self-pay | Admitting: Psychiatry

## 2022-12-20 DIAGNOSIS — F411 Generalized anxiety disorder: Secondary | ICD-10-CM

## 2022-12-20 DIAGNOSIS — F3281 Premenstrual dysphoric disorder: Secondary | ICD-10-CM

## 2022-12-20 DIAGNOSIS — F902 Attention-deficit hyperactivity disorder, combined type: Secondary | ICD-10-CM

## 2022-12-20 DIAGNOSIS — G4700 Insomnia, unspecified: Secondary | ICD-10-CM

## 2022-12-20 DIAGNOSIS — R5383 Other fatigue: Secondary | ICD-10-CM | POA: Diagnosis not present

## 2022-12-20 MED ORDER — REXULTI 3 MG PO TABS: 3.0000 mg | ORAL_TABLET | Freq: Every day | ORAL | 1 refills | Status: DC

## 2022-12-20 MED ORDER — LISDEXAMFETAMINE DIMESYLATE 70 MG PO CAPS
70.0000 mg | ORAL_CAPSULE | Freq: Every day | ORAL | 0 refills | Status: AC
Start: 2022-12-24 — End: ?

## 2022-12-20 MED ORDER — LISDEXAMFETAMINE DIMESYLATE 70 MG PO CAPS
70.0000 mg | ORAL_CAPSULE | Freq: Every day | ORAL | 0 refills | Status: DC
Start: 2023-02-18 — End: 2023-04-20

## 2022-12-20 MED ORDER — LISDEXAMFETAMINE DIMESYLATE 70 MG PO CAPS
70.0000 mg | ORAL_CAPSULE | Freq: Every day | ORAL | 0 refills | Status: DC
Start: 2023-01-21 — End: 2023-04-20

## 2022-12-20 MED ORDER — LITHIUM CARBONATE 300 MG PO CAPS
600.0000 mg | ORAL_CAPSULE | Freq: Every day | ORAL | 1 refills | Status: DC
Start: 2022-12-20 — End: 2023-04-20

## 2022-12-20 MED ORDER — TRAZODONE HCL 100 MG PO TABS: ORAL_TABLET | ORAL | 1 refills | Status: DC

## 2022-12-20 MED ORDER — MODAFINIL 200 MG PO TABS
ORAL_TABLET | ORAL | 5 refills | Status: DC
Start: 2023-01-05 — End: 2023-08-10

## 2022-12-20 MED ORDER — LORAZEPAM 0.5 MG PO TABS
ORAL_TABLET | ORAL | 1 refills | Status: DC
Start: 2022-12-20 — End: 2023-04-20

## 2022-12-20 NOTE — Progress Notes (Signed)
Sheila James [id number] [birth year] 47 y.o.  Subjective:   Patient ID:  Sheila James is a 47 y.o. (DOB January 05, 1976) female.  Chief Complaint:  Chief Complaint  Patient presents with   Follow-up    Mood disturbance, anxiety, ADHD, and insomnia    HPI Sheila James presents to the office today for follow-up of anxiety, mood disturbance, ADHD, PMDD, and sleep disturbance.   "The last few weeks have been better." She reports that she had severe fatigue and "couldn't hold my head up." She missed a week of work as a result. She later realized she had run out of Modafinil. She reports that she was continuing to take Vyvanse and this was not helping with alertness. She reports that energy has improved since re-starting Modafinil. She reports that she has been having difficulty staying asleep. She reports sleeping 10 hours a day the week she had severe fatigue. Other weeks she reports sleeping 7-8 hours a night. She reports intermittent awakenings. She has been trying not to procrastinate so that she can go to bed and wind down earlier. She reports that her concentration and focus are "fine." She reports that her appetite has been "good. Even with the PMDD, I have not been over-eating." Denies risky or impulsive behavior. Denies SI.   "I'm not having any mood issues" outside of PMDD symptoms. Denies depressed mood. Denies irritability. She has had some anxiety. She reports that missing work caused some financial stress. Has some anxiety with PMDD.   She reports "dreading" work. Reports motivation has been lower towards work.   She reports that she continues to have severe PMDD. She reports that she is continuing to menstruate after starting Nexplanon. She reports that she is now having 2 menstrual cycles a month. She reports that Nexplanon has helped reduce PMDD symptoms.   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   Has been trying to increase  physical activity.   She has been seeing a therapist.   Modafinil last filled 12/08/22 Vyvanse last filled 11/26/22  Past Psychiatric Medication Trials: Wellbutrin- allergic reaction. Sertraline Prozac Buspar Lamictal- Some improvement in depressive s/s outside of pre-menstrual time Latuda- Caused increased glucose and cholesterol levels. Caused sleepiness Abilify- helpful for depression. Wt. Gain.  Risperdal Geodon Adderall XR Vyvanse Concerta- Ineffective (short duration and minimal improvement) Modafinil Melatonin- Ineffective Trazodone Doxepin- not helpful for sleep initiation     AIMS    Flowsheet Row Office Visit from 12/20/2022 in [facility] Office Visit from 09/19/2022 in [facility] Office Visit from 06/20/2022 in [facility] Office Visit from 04/20/2022 in [facility] Office Visit from 01/21/2022 in [facility]  AIMS Total Score 0 0 0 0 0      PHQ2-9    Flowsheet Row Nutrition from 07/16/2020 in [facility] Total Score 0        Review of Systems:  Review of Systems  Endocrine:       She reports improved glycemic control  Musculoskeletal:  Positive for back pain. Negative for gait problem.  Skin:  Negative for rash.  Neurological:  Negative for tremors.  Psychiatric/Behavioral:         Please refer to HPI    Medications: I have reviewed the patient's current medications.  Current Outpatient Medications  Medication Sig Dispense Refill   etonogestrel (  NEXPLANON) 68 MG IMPL implant 1 each by Subdermal route once.     B Complex Vitamins (VITAMIN-B COMPLEX PO) Take by mouth.     Brexpiprazole (REXULTI) 3 MG TABS Take 1 tablet (3 mg total) by mouth daily. 90 tablet 1   cyclobenzaprine (FLEXERIL) 5 MG tablet 1 tablet Orally three times a day as needed for 7  days     docusate sodium (COLACE) 100 MG capsule Take 100 mg by mouth daily as needed.     ferrous sulfate 325 (65 FE) MG tablet Take 325 mg by mouth 2 (two) times daily with a meal.     glimepiride (AMARYL) 2 MG tablet Take 2 mg by mouth daily.     ibuprofen (ADVIL) 200 MG tablet Take 200 mg by mouth every 6 (six) hours as needed.     [START ON 12/24/2022] lisdexamfetamine (VYVANSE) 70 MG capsule Take 1 capsule (70 mg total) by mouth daily. 30 capsule 0   [START ON 01/21/2023] lisdexamfetamine (VYVANSE) 70 MG capsule Take 1 capsule (70 mg total) by mouth daily. 30 capsule 0   [START ON 02/18/2023] lisdexamfetamine (VYVANSE) 70 MG capsule Take 1 capsule (70 mg total) by mouth daily. 30 capsule 0   lithium carbonate 300 MG capsule Take 2 capsules (600 mg total) by mouth at bedtime. 180 capsule 1   LORazepam (ATIVAN) 0.5 MG tablet Take 1/2- tab po BID prn 30 tablet 1   LYSINE PO Take by mouth.     metFORMIN (GLUCOPHAGE-XR) 500 MG 24 hr tablet Take 2 tablets (1,000 mg total) by mouth every morning. (Patient taking differently: Take 1,000 mg by mouth 2 (two) times daily with a meal.) 180 tablet 3   [START ON 01/05/2023] modafinil (PROVIGIL) 200 MG tablet Take 1 tablet in the morning and mid-day 60 tablet 5   Multiple Minerals-Vitamins (CALCIUM-MAGNESIUM-ZINC-D3 PO) Take by mouth.     Multiple Vitamins-Minerals (MULTIVITAMIN WOMEN PO) Take by mouth.     nabumetone (RELAFEN) 750 MG tablet Take 750 mg by mouth daily as needed. (Patient not taking: Reported on 09/19/2022)     naproxen (EC NAPROSYN) 500 MG EC tablet Take 500 mg by mouth 2 (two) times daily with a meal.     traZODone (DESYREL) 100 MG tablet Take 1.5-2 tabs po QHS prn insomnia 180 tablet 1   vitamin E 100 UNIT capsule Take 100 Units by mouth daily. (Patient not taking: Reported on 06/20/2022)     No current facility-administered medications for this visit.    Medication Side Effects: None  Allergies:  Allergies  Allergen Reactions    Wellbutrin Xl [Bupropion] Hives    Past Medical History:  Diagnosis Date   Asthma    Bursitis    Diabetes mellitus, type II ([facility])    Elevated cholesterol    Fatigue    H/O: depression    Migraines    Pica     Past Medical History, Surgical history, Social history, and Family history were reviewed and updated as appropriate.   Please see review of systems for further details on the patient's review from today.   Objective:   Physical Exam:  BP 132/81   Pulse 82   Physical Exam Constitutional:      General: She is not in acute distress. Musculoskeletal:        General: No deformity.  Neurological:     Mental Status: She is alert and oriented to person, place, and time.     Coordination: Coordination normal.  Psychiatric:  Attention and Perception: Attention and perception normal. She does not perceive auditory or visual hallucinations.        Mood and Affect: Mood normal. Mood is not anxious or depressed. Affect is not labile, blunt, angry or inappropriate.        Speech: Speech normal.        Behavior: Behavior normal.        Thought Content: Thought content normal. Thought content is not paranoid or delusional. Thought content does not include homicidal or suicidal ideation. Thought content does not include homicidal or suicidal plan.        Cognition and Memory: Cognition and memory normal.        Judgment: Judgment normal.     Comments: Insight intact     Lab Review:  No results found for: "NA", "K", "CL", "CO2", "GLUCOSE", "BUN", "CREATININE", "CALCIUM", "PROT", "ALBUMIN", "AST", "ALT", "ALKPHOS", "BILITOT", "GFRNONAA", "GFRAA"     Component Value Date/Time   WBC 12.8 (H) 05/30/2009 0528   RBC 2.82 (L) 05/30/2009 0528   HGB 8.2 DELTA CHECK NOTED (L) 05/30/2009 0528   HCT 24.3 (L) 05/30/2009 0528   PLT 188 05/30/2009 0528   MCV 86.3 05/30/2009 0528   MCHC 33.8 05/30/2009 0528   RDW 14.8 05/30/2009 0528    No results found for: "POCLITH", "LITHIUM"    No results found for: "PHENYTOIN", "PHENOBARB", "VALPROATE", "CBMZ"   .res Assessment: Plan:    Recommended contacting obgyn to discuss that she is menstruating twice a month with Nexplanon, and to ask if they have any additional recommendations.  Continue Lithium 600 mg po QHS for mood symptoms. Continue Rexulti 3 mg po qd for depression.  Continue Modafinil 400 mg in the morning for daytime somnolence and fatigue.  Continue Vyvanse 70 mg daily for ADHD. Continue Lorazepam 0.5 mg 1/2-1 tab po BID prn anxiety.  Continue Trazodone 100 mg 1.5-2 tabs po QHS prn insomnia.  Continue Lorazepam 0.5 mg 1/2-1 tab po BID prn anxiety.  Pt to follow-up in 3 months or sooner if clinically indicated.  Recommend continuing psychotherapy.  Patient advised to contact office with any questions, adverse effects, or acute worsening in signs and symptoms.   Sheila James was seen today for follow-up.  Diagnoses and all orders for this visit:  Generalized anxiety disorder -     LORazepam (ATIVAN) 0.5 MG tablet; Take 1/2- tab po BID prn  PMDD (premenstrual dysphoric disorder) -     Brexpiprazole (REXULTI) 3 MG TABS; Take 1 tablet (3 mg total) by mouth daily. -     lithium carbonate 300 MG capsule; Take 2 capsules (600 mg total) by mouth at bedtime.  Attention deficit hyperactivity disorder (ADHD), combined type -     lisdexamfetamine (VYVANSE) 70 MG capsule; Take 1 capsule (70 mg total) by mouth daily. -     lisdexamfetamine (VYVANSE) 70 MG capsule; Take 1 capsule (70 mg total) by mouth daily. -     lisdexamfetamine (VYVANSE) 70 MG capsule; Take 1 capsule (70 mg total) by mouth daily.  Fatigue, unspecified type -     modafinil (PROVIGIL) 200 MG tablet; Take 1 tablet in the morning and mid-day  Insomnia, unspecified type -     traZODone (DESYREL) 100 MG tablet; Take 1.5-2 tabs po QHS prn insomnia     Please see After Visit Summary for patient specific instructions.  Future Appointments  Date Time  Provider Department Center  03/22/2023 11:00 AM Corie Chiquito, PMHNP CP-[facility] None     No orders  of the defined types were placed in this encounter.   -------------------------------

## 2023-03-22 ENCOUNTER — Ambulatory Visit: Payer: No Typology Code available for payment source | Admitting: Psychiatry

## 2023-04-03 ENCOUNTER — Telehealth: Payer: Self-pay | Admitting: Psychiatry

## 2023-04-03 NOTE — Telephone Encounter (Signed)
LF 9/4, due 10/2

## 2023-04-03 NOTE — Telephone Encounter (Signed)
Pt called asking for a refill on her vyvanse 70 mg. Pharmacy is [facility] [address] in [place]

## 2023-04-05 ENCOUNTER — Other Ambulatory Visit: Payer: Self-pay

## 2023-04-05 DIAGNOSIS — F902 Attention-deficit hyperactivity disorder, combined type: Secondary | ICD-10-CM

## 2023-04-05 MED ORDER — LISDEXAMFETAMINE DIMESYLATE 70 MG PO CAPS
70.0000 mg | ORAL_CAPSULE | Freq: Every day | ORAL | 0 refills | Status: DC
Start: 2023-04-05 — End: 2023-04-20

## 2023-04-05 NOTE — Telephone Encounter (Signed)
Pended.

## 2023-04-20 ENCOUNTER — Encounter: Payer: Self-pay | Admitting: Psychiatry

## 2023-04-20 ENCOUNTER — Ambulatory Visit: Payer: No Typology Code available for payment source | Admitting: Psychiatry

## 2023-04-20 DIAGNOSIS — F902 Attention-deficit hyperactivity disorder, combined type: Secondary | ICD-10-CM

## 2023-04-20 DIAGNOSIS — F3281 Premenstrual dysphoric disorder: Secondary | ICD-10-CM

## 2023-04-20 DIAGNOSIS — F411 Generalized anxiety disorder: Secondary | ICD-10-CM | POA: Diagnosis not present

## 2023-04-20 DIAGNOSIS — G4700 Insomnia, unspecified: Secondary | ICD-10-CM | POA: Diagnosis not present

## 2023-04-20 MED ORDER — LISDEXAMFETAMINE DIMESYLATE 70 MG PO CAPS
70.0000 mg | ORAL_CAPSULE | Freq: Every day | ORAL | 0 refills | Status: DC
Start: 2023-06-28 — End: 2023-08-24

## 2023-04-20 MED ORDER — LISDEXAMFETAMINE DIMESYLATE 70 MG PO CAPS
70.0000 mg | ORAL_CAPSULE | Freq: Every day | ORAL | 0 refills | Status: DC
Start: 2023-05-31 — End: 2023-08-24

## 2023-04-20 MED ORDER — LISDEXAMFETAMINE DIMESYLATE 70 MG PO CAPS
70.0000 mg | ORAL_CAPSULE | Freq: Every day | ORAL | 0 refills | Status: DC
Start: 2023-05-03 — End: 2023-08-10

## 2023-04-20 MED ORDER — REXULTI 3 MG PO TABS
3.0000 mg | ORAL_TABLET | Freq: Every day | ORAL | 1 refills | Status: DC
Start: 2023-04-20 — End: 2023-08-24

## 2023-04-20 MED ORDER — TRAZODONE HCL 100 MG PO TABS
ORAL_TABLET | ORAL | 1 refills | Status: DC
Start: 2023-04-20 — End: 2023-08-24

## 2023-04-20 MED ORDER — LORAZEPAM 0.5 MG PO TABS
ORAL_TABLET | ORAL | 1 refills | Status: DC
Start: 2023-04-20 — End: 2023-11-21

## 2023-04-20 MED ORDER — LITHIUM CARBONATE 300 MG PO CAPS: 600.0000 mg | ORAL_CAPSULE | Freq: Every day | ORAL | 1 refills | Status: DC

## 2023-04-20 NOTE — Progress Notes (Signed)
Sheila James [id number] June 21, 1976 47 y.o.  Subjective:   Patient ID:  Sheila James is a 47 y.o. (DOB Aug 14, 1975) female.  Chief Complaint:  Chief Complaint  Patient presents with   Follow-up    Anxiety, mood disturbance, ADHD, insomnia    HPI Sheila James presents to the office today for follow-up of ADHD, anxiety, and mood disturbances.   She reports, "this is probably one of my better months" after several months that were not going as well. She reports that she had inadvertently been missing Lithium and falling asleep before taking it. She reports that her mood was depressed, "down-trodden, lazy" when not taking Lithium. She reports that her mood has improved with re-starting Lithium. Denies depressed mood at this time. Denies any irritability. Denies anxiety. She reports some financial stress.   She reports adequate sleep. She reports that she was having difficulty staying asleep when she was not taking Lithium and Trazodone. She is now taking bedtime medications consistently around 9:30 pm. Energy and motivation have improved some. She has been using some of her exercise equipment and feels this has helped her energy. Denies any enjoyment in things- "I just don't have time." She reports, "I haven't had a lot to be happy about." She reports that she has been trying to read and notices her thoughts will wander. She reports some difficulties with concentration. Appetite has been "ok." She reports that she has been craving sweets around the time of her menstrual cycle. She reports that she has been missing less work due to PMDD symptoms and is missing a few hours instead of entire days. Denies SI.   Reports that one of her sons has been doing very well with homebound school. Her other son requires frequent reminders and prompts. Oldest son is about to turn 44 yo and youngest son is almost 44 yo.   She has been moved to a different team and reports that this team  receives less inbound calls. She reports that she has to work 7 days a week. She reports limited time for her needs and rest.   She was seeing a therapist virtually and it trying to resume therapy. Reports that she had an appointment yesterday with therapist that was cancelled by therapist. She typically sees therapist pt bi-weekly.   She reports taking Lorazepam prn on occasion, typically when PMDD symptoms occur.   Reports taking Modafinil once daily and needs to take it twice daily during PMDD.   Vyvanse last filled 04/05/23. Modafinil last filled 03/04/23.  Past Psychiatric Medication Trials: Wellbutrin- allergic reaction. Sertraline Prozac Buspar Lamictal- Some improvement in depressive s/s outside of pre-menstrual time Latuda- Caused increased glucose and cholesterol levels. Caused sleepiness Abilify- helpful for depression. Wt. Gain.  Risperdal Geodon Adderall XR Vyvanse Concerta- Ineffective (short duration and minimal improvement) Modafinil Melatonin- Ineffective Trazodone Doxepin- not helpful for sleep initiation   AIMS    Flowsheet Row Office Visit from 04/20/2023 in [facility] Office Visit from 12/20/2022 in [facility] Office Visit from 09/19/2022 in [facility] Office Visit from 06/20/2022 in [facility] Office Visit from 04/20/2022 in [facility]  AIMS Total Score 0 0 0 0 0      [id number]    Flowsheet Row Nutrition from 07/16/2020 in [facility] Total Score 0        Review of Systems:  Review of Systems  Cardiovascular:  Negative for palpitations.  Musculoskeletal:  Negative for gait problem.  Neurological:  Negative for tremors.  Psychiatric/Behavioral:         Please refer to HPI    Medications: I have reviewed the patient's current  medications.  Current Outpatient Medications  Medication Sig Dispense Refill   B Complex Vitamins (VITAMIN-B COMPLEX PO) Take by mouth.     etonogestrel (NEXPLANON) 68 MG IMPL implant 1 each by Subdermal route once.     ferrous sulfate 325 (65 FE) MG tablet Take 325 mg by mouth 2 (two) times daily with a meal.     glimepiride (AMARYL) 2 MG tablet Take 2 mg by mouth daily.     ibuprofen (ADVIL) 200 MG tablet Take 200 mg by mouth every 6 (six) hours as needed.     metFORMIN (GLUCOPHAGE-XR) 500 MG 24 hr tablet Take 2 tablets (1,000 mg total) by mouth every morning. (Patient taking differently: Take 1,000 mg by mouth 2 (two) times daily with a meal.) 180 tablet 3   modafinil (PROVIGIL) 200 MG tablet Take 1 tablet in the morning and mid-day 60 tablet 5   Multiple Minerals-Vitamins (CALCIUM-MAGNESIUM-ZINC-D3 PO) Take by mouth.     Brexpiprazole (REXULTI) 3 MG TABS Take 1 tablet (3 mg total) by mouth daily. 90 tablet 1   docusate sodium (COLACE) 100 MG capsule Take 100 mg by mouth daily as needed. (Patient not taking: Reported on 04/20/2023)     [START ON 05/03/2023] lisdexamfetamine (VYVANSE) 70 MG capsule Take 1 capsule (70 mg total) by mouth daily. 30 capsule 0   [START ON 05/31/2023] lisdexamfetamine (VYVANSE) 70 MG capsule Take 1 capsule (70 mg total) by mouth daily. 30 capsule 0   [START ON 06/28/2023] lisdexamfetamine (VYVANSE) 70 MG capsule Take 1 capsule (70 mg total) by mouth daily. 30 capsule 0   lithium carbonate 300 MG capsule Take 2 capsules (600 mg total) by mouth at bedtime. 180 capsule 1   LORazepam (ATIVAN) 0.5 MG tablet Take 1/2- tab po BID prn 30 tablet 1   Multiple Vitamins-Minerals (MULTIVITAMIN WOMEN PO) Take by mouth. (Patient not taking: Reported on 04/20/2023)     nabumetone (RELAFEN) 750 MG tablet Take 750 mg by mouth daily as needed. (Patient not taking: Reported on 09/19/2022)     naproxen (EC NAPROSYN) 500 MG EC tablet Take 500 mg by mouth as needed.     traZODone  (DESYREL) 100 MG tablet Take 1.5-2 tabs po QHS prn insomnia 180 tablet 1   No current facility-administered medications for this visit.    Medication Side Effects: None  Allergies:  Allergies  Allergen Reactions   Wellbutrin Xl [Bupropion] Hives    Past Medical History:  Diagnosis Date   Asthma    Bursitis    Diabetes mellitus, type II (HCC)    Elevated cholesterol    Fatigue    H/O: depression    Migraines    Pica     Past Medical History, Surgical history, Social history, and Family history were reviewed and updated as appropriate.   Please see review of systems for further details on the patient's review from today.   Objective:   Physical Exam:  BP 136/84   Pulse 87   Physical Exam Constitutional:      General: She is not in acute distress. Musculoskeletal:        General: No deformity.  Neurological:     Mental Status: She is alert and oriented to person,  place, and time.     Coordination: Coordination normal.  Psychiatric:        Attention and Perception: Attention and perception normal. She does not perceive auditory or visual hallucinations.        Mood and Affect: Mood is not depressed. Affect is not labile, blunt, angry or inappropriate.        Speech: Speech normal.        Behavior: Behavior normal.        Thought Content: Thought content normal. Thought content is not paranoid or delusional. Thought content does not include homicidal or suicidal ideation. Thought content does not include homicidal or suicidal plan.        Cognition and Memory: Cognition and memory normal.        Judgment: Judgment normal.     Comments: Insight intact Mood is mildly anxious in response to financial stress     Lab Review:  No results found for: "NA", "K", "CL", "CO2", "GLUCOSE", "BUN", "CREATININE", "CALCIUM", "PROT", "ALBUMIN", "AST", "ALT", "ALKPHOS", "BILITOT", "GFRNONAA", "GFRAA"     Component Value Date/Time   WBC 12.8 (H) 05/30/2009 0528   RBC 2.82 (L)  05/30/2009 0528   HGB 8.2 DELTA CHECK NOTED (L) 05/30/2009 0528   HCT 24.3 (L) 05/30/2009 0528   PLT 188 05/30/2009 0528   MCV 86.3 05/30/2009 0528   MCHC 33.8 05/30/2009 0528   RDW 14.8 05/30/2009 0528    No results found for: "POCLITH", "LITHIUM"   No results found for: "PHENYTOIN", "PHENOBARB", "VALPROATE", "CBMZ"   .res Assessment: Plan:    Pt reports that she is now FMLA eligible. She reports that intermittent leave of 6 occurrences of up to 8 hours duration would be sufficient since PMDD symptoms have been better controlled recently compared to the past. Agree with re-starting intermittent FMLA to cover missed work due to flares of PMDD and anxiety.  Continue Vyvanse 70 mg daily for ADHD.  Continue Rexulti 3 mg daily for mood symptoms.  Continue Lithium 600 mg at bedtime for mood since pt reports worsening depression with missed doses.  Continue Trazodone for insomnia.  Continue Lorazepam prn panic.  Continue Modafinil for chronic fatigue and excessive daytime somnolence.  Pt to follow-up in 3 months or sooner if clinically indicated.  Recommend continuing psychotherapy.  Patient advised to contact office with any questions, adverse effects, or acute worsening in signs and symptoms.   Sheila James was seen today for follow-up.  Diagnoses and all orders for this visit:  Attention deficit hyperactivity disorder (ADHD), combined type -     lisdexamfetamine (VYVANSE) 70 MG capsule; Take 1 capsule (70 mg total) by mouth daily. -     lisdexamfetamine (VYVANSE) 70 MG capsule; Take 1 capsule (70 mg total) by mouth daily. -     lisdexamfetamine (VYVANSE) 70 MG capsule; Take 1 capsule (70 mg total) by mouth daily.  PMDD (premenstrual dysphoric disorder) -     Brexpiprazole (REXULTI) 3 MG TABS; Take 1 tablet (3 mg total) by mouth daily. -     lithium carbonate 300 MG capsule; Take 2 capsules (600 mg total) by mouth at bedtime.  Insomnia, unspecified type -     traZODone (DESYREL) 100  MG tablet; Take 1.5-2 tabs po QHS prn insomnia  Generalized anxiety disorder -     LORazepam (ATIVAN) 0.5 MG tablet; Take 1/2- tab po BID prn     Please see After Visit Summary for patient specific instructions.  Future Appointments  Date Time Provider Department Center  07/19/2023  1:30 PM Corie Chiquito, PMHNP [facility] None    No orders of the defined types were placed in this encounter.   -------------------------------

## 2023-05-17 ENCOUNTER — Encounter: Payer: Self-pay | Admitting: Psychiatry

## 2023-05-22 ENCOUNTER — Telehealth: Payer: Self-pay

## 2023-05-22 NOTE — Telephone Encounter (Signed)
Prior Authorization submitted and approved for Rexulti 3 mg with [facility] effective 05/22/2023-05/21/2024, PA# [id number]

## 2023-07-19 ENCOUNTER — Ambulatory Visit: Payer: No Typology Code available for payment source | Admitting: Psychiatry

## 2023-07-19 ENCOUNTER — Ambulatory Visit: Payer: No Typology Code available for payment source | Admitting: Adult Health

## 2023-07-28 ENCOUNTER — Ambulatory Visit: Payer: No Typology Code available for payment source | Admitting: Physician Assistant

## 2023-08-10 ENCOUNTER — Telehealth: Payer: Self-pay | Admitting: Psychiatry

## 2023-08-10 ENCOUNTER — Other Ambulatory Visit: Payer: Self-pay

## 2023-08-10 DIAGNOSIS — F902 Attention-deficit hyperactivity disorder, combined type: Secondary | ICD-10-CM

## 2023-08-10 DIAGNOSIS — R5383 Other fatigue: Secondary | ICD-10-CM

## 2023-08-10 MED ORDER — MODAFINIL 200 MG PO TABS
ORAL_TABLET | ORAL | 0 refills | Status: DC
Start: 2023-08-10 — End: 2023-08-24

## 2023-08-10 MED ORDER — LISDEXAMFETAMINE DIMESYLATE 70 MG PO CAPS: 70.0000 mg | ORAL_CAPSULE | Freq: Every day | ORAL | 0 refills | Status: DC

## 2023-08-10 NOTE — Telephone Encounter (Signed)
 Pended Vyvanse  70 mg and modafinil  to [facility] on [address]

## 2023-08-10 NOTE — Telephone Encounter (Signed)
 Pt has an appt with Sheila James on 08/24/23. She needs refills on her vyvanse  70 mg and her modafinil  200 mg. Pharmacy is [facility] [address] in [place]

## 2023-08-24 ENCOUNTER — Encounter: Payer: Self-pay | Admitting: Physician Assistant

## 2023-08-24 ENCOUNTER — Ambulatory Visit (INDEPENDENT_AMBULATORY_CARE_PROVIDER_SITE_OTHER): Payer: No Typology Code available for payment source | Admitting: Physician Assistant

## 2023-08-24 DIAGNOSIS — F411 Generalized anxiety disorder: Secondary | ICD-10-CM

## 2023-08-24 DIAGNOSIS — F902 Attention-deficit hyperactivity disorder, combined type: Secondary | ICD-10-CM | POA: Diagnosis not present

## 2023-08-24 DIAGNOSIS — R5383 Other fatigue: Secondary | ICD-10-CM | POA: Diagnosis not present

## 2023-08-24 DIAGNOSIS — G4700 Insomnia, unspecified: Secondary | ICD-10-CM

## 2023-08-24 DIAGNOSIS — F3281 Premenstrual dysphoric disorder: Secondary | ICD-10-CM

## 2023-08-24 DIAGNOSIS — F0634 Mood disorder due to known physiological condition with mixed features: Secondary | ICD-10-CM

## 2023-08-24 MED ORDER — REXULTI 3 MG PO TABS
3.0000 mg | ORAL_TABLET | Freq: Every day | ORAL | 5 refills | Status: DC
Start: 2023-08-24 — End: 2024-03-11

## 2023-08-24 MED ORDER — LISDEXAMFETAMINE DIMESYLATE 70 MG PO CAPS: 70.0000 mg | ORAL_CAPSULE | Freq: Every day | ORAL | 0 refills | Status: DC

## 2023-08-24 MED ORDER — LITHIUM CARBONATE 300 MG PO CAPS
600.0000 mg | ORAL_CAPSULE | Freq: Every day | ORAL | 5 refills | Status: DC
Start: 2023-08-24 — End: 2024-03-11

## 2023-08-24 MED ORDER — TRAZODONE HCL 100 MG PO TABS: ORAL_TABLET | ORAL | 5 refills | Status: DC

## 2023-08-24 MED ORDER — MODAFINIL 200 MG PO TABS
ORAL_TABLET | ORAL | 5 refills | Status: DC
Start: 2023-08-24 — End: 2024-03-11

## 2023-08-24 NOTE — Progress Notes (Signed)
 [facility]  Patient ID: Sheila James,  MRN: [id number]  PCP: Default, Provider, MD  Date of Evaluation: 08/24/2023 Time spent:25 minutes  Chief Complaint:  Chief Complaint   Follow-up    HISTORY/CURRENT STATUS: HPI Transfer from Corie Chiquito, NP who is no longer with the practice  Feels like her meds are working as well as they can.  If she does not take both stimulants she cannot focus and feels extremely tired during the day.  The modafinil helps that and the Vyvanse helps the ADHD. States that attention is good without easy distractibility.  Able to focus on things and finish tasks to completion.   Anxiety is well controlled.  She takes lorazepam for that and tries not to take it if at all possible.  She has been on that for a very long time, along with the stimulants.  She is not having panic attacks very often.  Patient is able to enjoy things.  Energy and motivation are good.  Work is going well.   No extreme sadness, tearfulness, or feelings of hopelessness.  Sleeps well most of the time.  She has to take the trazodone for that as needed.  It is still effective.  ADLs and personal hygiene are normal.  No change in memory.  Appetite has not changed.  Weight is stable.  She denies suicidal or homicidal thoughts.  Denies dizziness, syncope, seizures, numbness, tingling, tremor, tics, unsteady gait, slurred speech, confusion. Denies muscle or joint pain, stiffness, or dystonia. Denies unexplained weight loss, frequent infections, or sores that heal slowly.  No polyphagia, polydipsia, or polyuria. Denies visual changes or paresthesias.   Individual Medical History/ Review of Systems: Changes? :No   Past Psychiatric Medication Trials: Wellbutrin- allergic reaction. Sertraline Prozac Buspar Lamictal- Some improvement in depressive s/s outside of pre-menstrual time, memory decreased Latuda- Caused increased glucose and cholesterol levels. Caused  sleepiness Abilify- helpful for depression. Wt. Gain.  Risperdal Geodon Adderall XR-only lasted 6 hours. Vyvanse Concerta- Ineffective (short duration and minimal improvement) Modafinil Melatonin- Ineffective Trazodone Doxepin- not helpful for sleep initiation  Allergies: Wellbutrin xl [bupropion]  Current Medications:  Current Outpatient Medications:    B Complex Vitamins (VITAMIN-B COMPLEX PO), Take by mouth., Disp: , Rfl:    etonogestrel (NEXPLANON) 68 MG IMPL implant, 1 each by Subdermal route once., Disp: , Rfl:    ferrous sulfate 325 (65 FE) MG tablet, Take 325 mg by mouth 2 (two) times daily with a meal., Disp: , Rfl:    ibuprofen (ADVIL) 200 MG tablet, Take 200 mg by mouth every 6 (six) hours as needed., Disp: , Rfl:    LORazepam (ATIVAN) 0.5 MG tablet, Take 1/2- tab po BID prn, Disp: 30 tablet, Rfl: 1   metFORMIN (GLUCOPHAGE-XR) 500 MG 24 hr tablet, Take 2 tablets (1,000 mg total) by mouth every morning. (Patient taking differently: Take 1,000 mg by mouth 2 (two) times daily with a meal.), Disp: 180 tablet, Rfl: 3   MOUNJARO 2.5 MG/0.5ML Pen, Inject 2.5 mg into the skin once a week., Disp: , Rfl:    Multiple Minerals-Vitamins (CALCIUM-MAGNESIUM-ZINC-D3 PO), Take by mouth., Disp: , Rfl:    naproxen (EC NAPROSYN) 500 MG EC tablet, Take 500 mg by mouth as needed., Disp: , Rfl:    Brexpiprazole (REXULTI) 3 MG TABS, Take 1 tablet (3 mg total) by mouth daily., Disp: 30 tablet, Rfl: 5   docusate sodium (COLACE) 100 MG capsule, Take 100 mg by mouth daily as needed. (Patient not taking: Reported  on 08/24/2023), Disp: , Rfl:    glimepiride (AMARYL) 2 MG tablet, Take 2 mg by mouth daily. (Patient not taking: Reported on 08/24/2023), Disp: , Rfl:    [START ON 09/07/2023] lisdexamfetamine (VYVANSE) 70 MG capsule, Take 1 capsule (70 mg total) by mouth daily., Disp: 30 capsule, Rfl: 0   [START ON 10/07/2023] lisdexamfetamine (VYVANSE) 70 MG capsule, Take 1 capsule (70 mg total) by mouth daily.,  Disp: 30 capsule, Rfl: 0   [START ON 11/05/2023] lisdexamfetamine (VYVANSE) 70 MG capsule, Take 1 capsule (70 mg total) by mouth daily., Disp: 30 capsule, Rfl: 0   lithium carbonate 300 MG capsule, Take 2 capsules (600 mg total) by mouth at bedtime., Disp: 60 capsule, Rfl: 5   modafinil (PROVIGIL) 200 MG tablet, Take 1 tablet in the morning and mid-day, Disp: 60 tablet, Rfl: 5   Multiple Vitamins-Minerals (MULTIVITAMIN WOMEN PO), Take by mouth. (Patient not taking: Reported on 04/20/2023), Disp: , Rfl:    nabumetone (RELAFEN) 750 MG tablet, Take 750 mg by mouth daily as needed. (Patient not taking: Reported on 08/24/2023), Disp: , Rfl:    traZODone (DESYREL) 100 MG tablet, Take 1.5-2 tabs po QHS prn insomnia, Disp: 60 tablet, Rfl: 5 Medication Side Effects: none  Family Medical/ Social History: Changes? No  MENTAL HEALTH EXAM:  There were no vitals taken for this visit.There is no height or weight on file to calculate BMI.  General Appearance: Casual and Well Groomed  Eye Contact:  Good  Speech:  Clear and Coherent and Normal Rate  Volume:  Normal  Mood:  Euthymic  Affect:  Congruent  Thought Process:  Goal Directed and Descriptions of Associations: Circumstantial  Orientation:  Full (Time, Place, and Person)  Thought Content: Logical   Suicidal Thoughts:  No  Homicidal Thoughts:  No  Memory:  WNL  Judgement:  Good  Insight:  Good  Psychomotor Activity:  Normal  Concentration:  Concentration: Good and Attention Span: Good  Recall:  Good  Fund of Knowledge: Good  Language: Good  Assets:  Desire for Improvement Financial Resources/Insurance Housing Transportation Vocational/Educational  ADL's:  Intact  Cognition: WNL  Prognosis:  Good   PCP follows labs.  DIAGNOSES:    ICD-10-CM   1. Attention deficit hyperactivity disorder (ADHD), combined type  F90.2 lisdexamfetamine (VYVANSE) 70 MG capsule    lisdexamfetamine (VYVANSE) 70 MG capsule    lisdexamfetamine (VYVANSE) 70 MG  capsule    2. PMDD (premenstrual dysphoric disorder)  F32.81 Brexpiprazole (REXULTI) 3 MG TABS    lithium carbonate 300 MG capsule    3. Generalized anxiety disorder  F41.1     4. Fatigue, unspecified type  R53.83 modafinil (PROVIGIL) 200 MG tablet    5. Mood disorder due to known physiological condition with mixed features  F06.34     6. Insomnia, unspecified type  G47.00 traZODone (DESYREL) 100 MG tablet     Receiving Psychotherapy: Yes   RECOMMENDATIONS:  PDMP reviewed.  Modafinil filled 08/11/2023.  Vyvanse filled 08/11/2023. I provided 25 minutes of face to face time during this encounter, including time spent before and after the visit in records review, medical decision making, counseling pertinent to today's visit, and charting.   She is stable on current meds so no changes will be made.  Continue Rexulti 3 mg, 1 p.o. daily. Continue Vyvanse 70 mg, 1 p.o. every morning. Continue lithium 300 mg, 2 p.o. nightly. Continue Ativan 0.5 mg, 1/2-1 p.o. twice daily as needed.  Take as sparingly as possible. Continue  modafinil 200 mg, 1 p.o. every morning and 1 p.o. midday. Continue trazodone 100 mg, 1.5 to 2 pills nightly as needed. Stressed importance of taking supplements. Continue therapy. Return in 3 months.  Melony Overly, PA-C

## 2023-11-21 ENCOUNTER — Ambulatory Visit (INDEPENDENT_AMBULATORY_CARE_PROVIDER_SITE_OTHER): Payer: No Typology Code available for payment source | Admitting: Physician Assistant

## 2023-11-21 ENCOUNTER — Encounter: Payer: Self-pay | Admitting: Physician Assistant

## 2023-11-21 DIAGNOSIS — F411 Generalized anxiety disorder: Secondary | ICD-10-CM

## 2023-11-21 DIAGNOSIS — F0634 Mood disorder due to known physiological condition with mixed features: Secondary | ICD-10-CM | POA: Diagnosis not present

## 2023-11-21 DIAGNOSIS — R5383 Other fatigue: Secondary | ICD-10-CM | POA: Diagnosis not present

## 2023-11-21 DIAGNOSIS — F902 Attention-deficit hyperactivity disorder, combined type: Secondary | ICD-10-CM | POA: Diagnosis not present

## 2023-11-21 MED ORDER — LORAZEPAM 0.5 MG PO TABS: ORAL_TABLET | ORAL | 1 refills | Status: DC

## 2023-11-21 MED ORDER — JORNAY PM 100 MG PO CP24: 100.0000 mg | ORAL_CAPSULE | Freq: Every evening | ORAL | 0 refills | Status: DC

## 2023-11-21 NOTE — Progress Notes (Unsigned)
 [facility]  Patient ID: Sheila James,  MRN: [id number]  PCP: Default, Provider, MD  Date of Evaluation: 11/21/2023 Time spent:25 minutes  Chief Complaint:  Chief Complaint   ADD; Anxiety; Follow-up    HISTORY/CURRENT STATUS: HPI  For routine med check.  Even with the Vyvanse  and Modafinil , she doesn't have a lot of energy. Has trouble getting up and doing things. She usually [employer] every morning, but in March and April, it slacked off d/t low energy. Then in May, she hasn't done it at all. The focus is ok, just doesn't have the motivation to get going.  Not getting on her bike every morning for about 15 mins like she did, and thinks that is probably a lot of the problem. Also anemic, but hasn't had labs since Feb.  Work is going well but she has a lot of down time and that it's hard for her not to lie down, since she works from.   Doesn't feel depressed.  Patient is able to enjoy things.  Energy and motivation are good.   No extreme sadness, tearfulness, or feelings of hopelessness.  Sleeps well most of the time. ADLs and personal hygiene are normal.   Appetite has not changed.  Weight is stable.   Anxiety is well-controlled.  She tries not to take the Ativan  at all but it's helpful when needed.  She's been on a stimulant and BZ for a long time.  Denies suicidal or homicidal thoughts.  Patient denies increased energy with decreased need for sleep, increased talkativeness, racing thoughts, impulsivity or risky behaviors, increased spending, increased libido, grandiosity, increased irritability or anger, paranoia, or hallucinations.  Denies dizziness, syncope, seizures, numbness, tingling, tremor, tics, unsteady gait, slurred speech, confusion. Denies muscle or joint pain, stiffness, or dystonia.  Individual Medical History/ Review of Systems: Changes? :No   Past Psychiatric Medication Trials: Wellbutrin - allergic reaction. Sertraline  Prozac  Buspar  Lamictal - Some  improvement in depressive s/s outside of pre-menstrual time, memory decreased Latuda - Caused increased glucose and cholesterol levels. Caused sleepiness Abilify - helpful for depression. Wt. Gain.  Risperdal  Geodon  Adderall  XR-only lasted 6 hours. Vyvanse  Concerta - Ineffective (short duration and minimal improvement) Modafinil  Melatonin- Ineffective Trazodone  Doxepin - not helpful for sleep initiation  Allergies: Wellbutrin  xl [bupropion ]  Current Medications:  Current Outpatient Medications:    B Complex Vitamins (VITAMIN-B COMPLEX PO), Take by mouth., Disp: , Rfl:    Brexpiprazole  (REXULTI ) 3 MG TABS, Take 1 tablet (3 mg total) by mouth daily., Disp: 30 tablet, Rfl: 5   etonogestrel (NEXPLANON) 68 MG IMPL implant, 1 each by Subdermal route once., Disp: , Rfl:    ferrous sulfate 325 (65 FE) MG tablet, Take 325 mg by mouth 2 (two) times daily with a meal., Disp: , Rfl:    ibuprofen (ADVIL) 200 MG tablet, Take 200 mg by mouth every 6 (six) hours as needed., Disp: , Rfl:    lithium  carbonate 300 MG capsule, Take 2 capsules (600 mg total) by mouth at bedtime., Disp: 60 capsule, Rfl: 5   metFORMIN  (GLUCOPHAGE -XR) 500 MG 24 hr tablet, Take 2 tablets (1,000 mg total) by mouth every morning. (Patient taking differently: Take 1,000 mg by mouth 2 (two) times daily with a meal.), Disp: 180 tablet, Rfl: 3   Methylphenidate  HCl ER, PM, (JORNAY PM ) 100 MG CP24, Take 1 capsule (100 mg total) by mouth at bedtime., Disp: 30 capsule, Rfl: 0   modafinil  (PROVIGIL ) 200 MG tablet, Take 1 tablet in the morning and mid-day, Disp: 60 tablet,  Rfl: 5   MOUNJARO 2.5 MG/0.5ML Pen, Inject 2.5 mg into the skin once a week., Disp: , Rfl:    Multiple Minerals-Vitamins (CALCIUM-MAGNESIUM-ZINC-D3 PO), Take by mouth., Disp: , Rfl:    naproxen (EC NAPROSYN) 500 MG EC tablet, Take 500 mg by mouth as needed., Disp: , Rfl:    traZODone  (DESYREL ) 100 MG tablet, Take 1.5-2 tabs po QHS prn insomnia, Disp: 60 tablet, Rfl: 5    docusate sodium (COLACE) 100 MG capsule, Take 100 mg by mouth daily as needed. (Patient not taking: Reported on 04/20/2023), Disp: , Rfl:    glimepiride (AMARYL) 2 MG tablet, Take 2 mg by mouth daily. (Patient not taking: Reported on 11/21/2023), Disp: , Rfl:    LORazepam  (ATIVAN ) 0.5 MG tablet, Take 1/2- tab po BID prn, Disp: 30 tablet, Rfl: 1   Multiple Vitamins-Minerals (MULTIVITAMIN WOMEN PO), Take by mouth. (Patient not taking: Reported on 11/21/2023), Disp: , Rfl:    nabumetone (RELAFEN) 750 MG tablet, Take 750 mg by mouth daily as needed. (Patient not taking: Reported on 09/19/2022), Disp: , Rfl:  Medication Side Effects: none  Family Medical/ Social History: Changes? No  MENTAL HEALTH EXAM:  There were no vitals taken for this visit.There is no height or weight on file to calculate BMI.  General Appearance: Casual and Well Groomed  Eye Contact:  Good  Speech:  Clear and Coherent and Normal Rate  Volume:  Normal  Mood:  Euthymic  Affect:  Congruent  Thought Process:  Goal Directed and Descriptions of Associations: Circumstantial  Orientation:  Full (Time, Place, and Person)  Thought Content: Logical   Suicidal Thoughts:  No  Homicidal Thoughts:  No  Memory:  WNL  Judgement:  Good  Insight:  Good  Psychomotor Activity:  Normal  Concentration:  Concentration: Good and Attention Span: Good  Recall:  Good  Fund of Knowledge: Good  Language: Good  Assets:  Communication Skills Desire for Improvement Financial Resources/Insurance Housing Transportation Vocational/Educational  ADL's:  Intact  Cognition: WNL  Prognosis:  Good   PCP follows labs.  DIAGNOSES:    ICD-10-CM   1. Attention deficit hyperactivity disorder (ADHD), combined type  F90.2     2. Generalized anxiety disorder  F41.1 LORazepam  (ATIVAN ) 0.5 MG tablet    3. Fatigue, unspecified type  R53.83     4. Mood disorder due to known physiological condition with mixed features  F06.34      Receiving  Psychotherapy: Yes   RECOMMENDATIONS:  PDMP reviewed.  Modafinil  filled 11/14/2023.  Vyvanse  filled 11/14/2023. I provided 25 minutes of face to face time during this encounter, including time spent before and after the visit in records review, medical decision making, counseling pertinent to today's visit, and charting.   Discussed Jornay PM  instead of Vyvanse .  Pros and cons were given.  She would like to try it.  She will continue the Vyvanse  until the Jornay PM  is approved and she is due to start it.  No co-pay cards were available in office but she went to the website and downloaded that while in my office and knows to present it to the pharmacy when she picks up the Jornay PM .  Get back in with her PCP for eval, labs and management of history of anemia.  The fatigue could be caused from a physical issue.  Continue Rexulti  3 mg, 1 p.o. daily. Continue lithium  300 mg, 2 p.o. nightly. Continue Ativan  0.5 mg, 1/2-1 p.o. twice daily as needed.  Take as sparingly  as possible. Start Jornay PM , 100 mg at bedtime. Continue modafinil  200 mg, 1 p.o. every morning and 1 p.o. midday. Continue trazodone  100 mg, 1.5 to 2 pills nightly as needed. Stressed importance of taking supplements. Continue therapy. Return in 6-8 weeks.    Marvia Slocumb, PA-C

## 2023-11-22 ENCOUNTER — Telehealth: Payer: Self-pay

## 2023-11-23 NOTE — Telephone Encounter (Signed)
 Sheila James

## 2023-12-04 ENCOUNTER — Telehealth: Payer: Self-pay | Admitting: Physician Assistant

## 2023-12-04 NOTE — Telephone Encounter (Signed)
 Pt called office and LM. She is stating that her pharmacy is waiting for a PA on Journay PM. Has this been sent in yet?

## 2023-12-05 NOTE — Telephone Encounter (Signed)
 Pt has 2 different coverages for her medication, [employer] and [employer] [place] [employer]. PA's initiated with both for Jornay 100 mg #30/30.  Pt received approval with [employer] effective 12/05/23-12/04/24, PA# [id number] and approval with [employer]  Medicaid effective through 12/04/24, PA# [id number]

## 2023-12-05 NOTE — Telephone Encounter (Signed)
 No PA request received that I am aware of. A PA can be initiated

## 2023-12-06 NOTE — Telephone Encounter (Signed)
 LVM to [facility]

## 2024-01-04 ENCOUNTER — Ambulatory Visit (INDEPENDENT_AMBULATORY_CARE_PROVIDER_SITE_OTHER): Admitting: Physician Assistant

## 2024-01-04 ENCOUNTER — Encounter: Payer: Self-pay | Admitting: Physician Assistant

## 2024-01-04 DIAGNOSIS — Z79899 Other long term (current) drug therapy: Secondary | ICD-10-CM | POA: Diagnosis not present

## 2024-01-04 DIAGNOSIS — F0634 Mood disorder due to known physiological condition with mixed features: Secondary | ICD-10-CM

## 2024-01-04 DIAGNOSIS — F411 Generalized anxiety disorder: Secondary | ICD-10-CM

## 2024-01-04 MED ORDER — JORNAY PM 100 MG PO CP24: 100.0000 mg | ORAL_CAPSULE | Freq: Every evening | ORAL | 0 refills | Status: DC

## 2024-01-04 MED ORDER — LORAZEPAM 0.5 MG PO TABS: ORAL_TABLET | ORAL | 1 refills | Status: DC

## 2024-01-04 NOTE — Progress Notes (Signed)
 [facility]  Patient ID: Sheila James,  MRN: [id number]  PCP: Default, Provider, MD  Date of Evaluation: 01/04/2024 Time spent:30 minutes  Chief Complaint:  Chief Complaint   ADHD; Depression; Anxiety; Follow-up    HISTORY/CURRENT STATUS: HPI  For routine med check.  Jornay PM  was started at the [facility]OV. Has been helpful. More energy and motivation. States that attention is good without easy distractibility.  Able to focus on things and finish tasks to completion.   Work is going well.   No extreme sadness, tearfulness, or feelings of hopelessness.  Sleeps ok. ADLs and personal hygiene are normal.   Appetite has not changed.  Weight is stable.  No mania, psychosis, delirium. Denies suicidal or homicidal thoughts.  Denies dizziness, syncope, seizures, numbness, tingling, tremor, tics, unsteady gait, slurred speech, confusion. Denies muscle or joint pain, stiffness, or dystonia. Denies unexplained weight loss, frequent infections, or sores that heal slowly.  No polyphagia, polydipsia, or polyuria. Denies visual changes or paresthesias.   Individual Medical History/ Review of Systems: Changes? :Yes  back pain. Seeing Chiro.  Past Psychiatric Medication Trials: Wellbutrin - allergic reaction. Sertraline  Prozac  Buspar  Lamictal - Some improvement in depressive s/s outside of pre-menstrual time, memory decreased Latuda - Caused increased glucose and cholesterol levels. Caused sleepiness Abilify - helpful for depression. Wt. Gain.  Risperdal  Geodon  Adderall  XR-only lasted 6 hours. Vyvanse  Concerta - Ineffective (short duration and minimal improvement) Modafinil  Melatonin- Ineffective Trazodone  Doxepin - not helpful for sleep initiation  Allergies: Wellbutrin  xl [bupropion ]  Current Medications:  Current Outpatient Medications:    B Complex Vitamins (VITAMIN-B COMPLEX PO), Take by mouth., Disp: , Rfl:    Brexpiprazole  (REXULTI ) 3 MG TABS, Take 1 tablet (3 mg total)  by mouth daily., Disp: 30 tablet, Rfl: 5   etonogestrel (NEXPLANON) 68 MG IMPL implant, 1 each by Subdermal route once., Disp: , Rfl:    ferrous sulfate 325 (65 FE) MG tablet, Take 325 mg by mouth 2 (two) times daily with a meal., Disp: , Rfl:    lithium  carbonate 300 MG capsule, Take 2 capsules (600 mg total) by mouth at bedtime., Disp: 60 capsule, Rfl: 5   Methylphenidate  HCl ER, PM, (JORNAY PM ) 100 MG CP24, Take 1 capsule (100 mg total) by mouth at bedtime., Disp: 30 capsule, Rfl: 0   MOUNJARO 2.5 MG/0.5ML Pen, Inject 2.5 mg into the skin once a week., Disp: , Rfl:    Multiple Minerals-Vitamins (CALCIUM-MAGNESIUM-ZINC-D3 PO), Take by mouth., Disp: , Rfl:    naproxen (EC NAPROSYN) 500 MG EC tablet, Take 500 mg by mouth as needed., Disp: , Rfl:    traZODone  (DESYREL ) 100 MG tablet, Take 1.5-2 tabs po QHS prn insomnia, Disp: 60 tablet, Rfl: 5   docusate sodium (COLACE) 100 MG capsule, Take 100 mg by mouth daily as needed. (Patient not taking: Reported on 01/04/2024), Disp: , Rfl:    glimepiride (AMARYL) 2 MG tablet, Take 2 mg by mouth daily. (Patient not taking: Reported on 11/21/2023), Disp: , Rfl:    ibuprofen (ADVIL) 200 MG tablet, Take 200 mg by mouth every 6 (six) hours as needed., Disp: , Rfl:    LORazepam  (ATIVAN ) 0.5 MG tablet, Take 1/2- tab po BID prn, Disp: 30 tablet, Rfl: 1   metFORMIN  (GLUCOPHAGE -XR) 500 MG 24 hr tablet, Take 2 tablets (1,000 mg total) by mouth every morning. (Patient taking differently: Take 1,000 mg by mouth 2 (two) times daily with a meal.), Disp: 180 tablet, Rfl: 3   [START ON 02/03/2024] Methylphenidate  HCl ER, PM, (JORNAY  PM) 100 MG CP24, Take 1 capsule (100 mg total) by mouth at bedtime., Disp: 30 capsule, Rfl: 0   modafinil  (PROVIGIL ) 200 MG tablet, Take 1 tablet in the morning and mid-day, Disp: 60 tablet, Rfl: 5   Multiple Vitamins-Minerals (MULTIVITAMIN WOMEN PO), Take by mouth. (Patient not taking: Reported on 11/21/2023), Disp: , Rfl:    nabumetone (RELAFEN) 750  MG tablet, Take 750 mg by mouth daily as needed. (Patient not taking: Reported on 09/19/2022), Disp: , Rfl:  Medication Side Effects: none  Family Medical/ Social History: Changes? No  MENTAL HEALTH EXAM:  There were no vitals taken for this visit.There is no height or weight on file to calculate BMI.  General Appearance: Casual and Well Groomed  Eye Contact:  Good  Speech:  Clear and Coherent and Normal Rate  Volume:  Normal  Mood:  Euthymic  Affect:  Congruent  Thought Process:  Goal Directed and Descriptions of Associations: Circumstantial  Orientation:  Full (Time, Place, and Person)  Thought Content: Logical   Suicidal Thoughts:  No  Homicidal Thoughts:  No  Memory:  WNL  Judgement:  Good  Insight:  Good  Psychomotor Activity:  Normal  Concentration:  Concentration: Good and Attention Span: Good  Recall:  Good  Fund of Knowledge: Good  Language: Good  Assets:  Communication Skills Desire for Improvement Financial Resources/Insurance Housing Resilience Transportation Vocational/Educational  ADL's:  Intact  Cognition: WNL  Prognosis:  Good   PCP follows labs.  DIAGNOSES:    ICD-10-CM   1. Encounter for long-term (current) use of medications  Z79.899 Lithium  level    2. Generalized anxiety disorder  F41.1 LORazepam  (ATIVAN ) 0.5 MG tablet    3. Mood disorder due to known physiological condition with mixed features  F06.34 Lithium  level     Receiving Psychotherapy: Yes   RECOMMENDATIONS:  PDMP reviewed.  Jornay PM  filled 12/08/2023.  Ativan  filled 12/06/2023. I provided approximately 30 minutes of face to face time during this encounter, including time spent before and after the visit in records review, medical decision making, counseling pertinent to today's visit, and charting.   She's responding well to Jornay PM  so no changes will be made. At some point, we'll discuss getting off Ativan , but now isn't a good time.   Continue Rexulti  3 mg, 1 p.o.  daily. Continue lithium  300 mg, 2 p.o. nightly. Continue Ativan  0.5 mg, 1/2-1 p.o. twice daily as needed.  Take as sparingly as possible. Continue Jornay PM , 100 mg at bedtime. Continue modafinil  200 mg, 1 p.o. every morning and 1 p.o. midday prn. Continue trazodone  100 mg, 1.5 to 2 pills nightly as needed. Lab ordered as above. Continue therapy. Return in 2 months.   Verneita Cooks, PA-C

## 2024-03-11 ENCOUNTER — Encounter: Payer: Self-pay | Admitting: Physician Assistant

## 2024-03-11 ENCOUNTER — Ambulatory Visit (INDEPENDENT_AMBULATORY_CARE_PROVIDER_SITE_OTHER): Admitting: Physician Assistant

## 2024-03-11 DIAGNOSIS — F411 Generalized anxiety disorder: Secondary | ICD-10-CM | POA: Diagnosis not present

## 2024-03-11 DIAGNOSIS — F3281 Premenstrual dysphoric disorder: Secondary | ICD-10-CM

## 2024-03-11 DIAGNOSIS — F0634 Mood disorder due to known physiological condition with mixed features: Secondary | ICD-10-CM

## 2024-03-11 DIAGNOSIS — R5383 Other fatigue: Secondary | ICD-10-CM

## 2024-03-11 DIAGNOSIS — G4700 Insomnia, unspecified: Secondary | ICD-10-CM

## 2024-03-11 MED ORDER — LITHIUM CARBONATE 300 MG PO CAPS
600.0000 mg | ORAL_CAPSULE | Freq: Every day | ORAL | 5 refills | Status: AC
Start: 2024-03-11 — End: ?

## 2024-03-11 MED ORDER — LORAZEPAM 0.5 MG PO TABS
ORAL_TABLET | ORAL | 5 refills | Status: AC
Start: 2024-03-11 — End: ?

## 2024-03-11 MED ORDER — REXULTI 3 MG PO TABS
3.0000 mg | ORAL_TABLET | Freq: Every day | ORAL | 5 refills | Status: AC
Start: 2024-03-11 — End: ?

## 2024-03-11 MED ORDER — JORNAY PM 100 MG PO CP24: 100.0000 mg | ORAL_CAPSULE | Freq: Every evening | ORAL | 0 refills | Status: DC

## 2024-03-11 MED ORDER — TRAZODONE HCL 100 MG PO TABS: ORAL_TABLET | ORAL | 5 refills | Status: AC

## 2024-03-11 MED ORDER — MODAFINIL 200 MG PO TABS
ORAL_TABLET | ORAL | 5 refills | Status: AC
Start: 2024-03-11 — End: ?

## 2024-03-11 NOTE — Progress Notes (Signed)
 [facility]  Patient ID: Sheila James,  MRN: [id number]  PCP: Default, Provider, MD  Date of Evaluation: 03/11/2024 Time spent:25 minutes  Chief Complaint:  Chief Complaint   Anxiety; Follow-up    HISTORY/CURRENT STATUS: HPI  For routine med check.  Feels like her meds are working well. Sometimes she'll wake up and can't go back to sleep for an hour or so. Takes Traz 150 mg, if she takes more, it causes morning sedation. She gets up early to [place] at 6:00, then to her regular job, so she can't be drowsy in the mornings. The midnocturnal awakening occurred maybe 3 times this past month.   Patient is able to enjoy things.  Energy and motivation are good.  Work is ok.   No extreme sadness, tearfulness, or feelings of hopelessness.   ADLs and personal hygiene are normal.   Appetite has not changed.  Weight is stable. Anxiety is controlled.  Takes Ativan  occas and it is effective.  No mania, delirium, AH/VH.  No SI/HI.  Individual Medical History/ Review of Systems: Changes? :Yes   still having back pain, no longer seeing chiro. Doing exercises and walking which helps.   Past Psychiatric Medication Trials: Wellbutrin - allergic reaction. Sertraline  Prozac  Buspar  Lamictal - Some improvement in depressive s/s outside of pre-menstrual time, memory decreased Latuda - Caused increased glucose and cholesterol levels. Caused sleepiness Abilify - helpful for depression. Wt. Gain.  Risperdal  Geodon  Adderall  XR-only lasted 6 hours. Vyvanse  Concerta - Ineffective (short duration and minimal improvement) Modafinil  Melatonin- Ineffective Trazodone  Doxepin - not helpful for sleep initiation  Allergies: Wellbutrin  xl [bupropion ]  Current Medications:  Current Outpatient Medications:    B Complex Vitamins (VITAMIN-B COMPLEX PO), Take by mouth., Disp: , Rfl:    Brexpiprazole  (REXULTI ) 3 MG TABS, Take 1 tablet (3 mg total) by mouth daily., Disp: 30 tablet, Rfl: 5   etonogestrel  (NEXPLANON) 68 MG IMPL implant, 1 each by Subdermal route once., Disp: , Rfl:    ferrous sulfate 325 (65 FE) MG tablet, Take 325 mg by mouth 2 (two) times daily with a meal., Disp: , Rfl:    ibuprofen (ADVIL) 200 MG tablet, Take 200 mg by mouth every 6 (six) hours as needed., Disp: , Rfl:    lisinopril (ZESTRIL) 10 MG tablet, Take 10 mg by mouth daily., Disp: , Rfl:    lithium  carbonate 300 MG capsule, Take 2 capsules (600 mg total) by mouth at bedtime., Disp: 60 capsule, Rfl: 5   LORazepam  (ATIVAN ) 0.5 MG tablet, Take 1/2- tab po BID prn, Disp: 30 tablet, Rfl: 1   Magnesium 250 MG TABS, 1 tablet with a meal Orally Once a day; Duration: 30 day(s), Disp: , Rfl:    Methylphenidate  HCl ER, PM, (JORNAY PM ) 100 MG CP24, Take 1 capsule (100 mg total) by mouth at bedtime., Disp: 30 capsule, Rfl: 0   Methylphenidate  HCl ER, PM, (JORNAY PM ) 100 MG CP24, Take 1 capsule (100 mg total) by mouth at bedtime., Disp: 30 capsule, Rfl: 0   modafinil  (PROVIGIL ) 200 MG tablet, Take 1 tablet in the morning and mid-day, Disp: 60 tablet, Rfl: 5   MOUNJARO 2.5 MG/0.5ML Pen, Inject 2.5 mg into the skin once a week., Disp: , Rfl:    naproxen (EC NAPROSYN) 500 MG EC tablet, Take 500 mg by mouth as needed., Disp: , Rfl:    Oyster Shell Calcium 500 MG TABS, 1 tablet with meals Orally Twice a day; Duration: 30 day(s), Disp: , Rfl:    traZODone  (DESYREL ) 100  MG tablet, Take 1.5-2 tabs po QHS prn insomnia, Disp: 60 tablet, Rfl: 5   Vitamin E 45 MG (100 UNIT) CAPS, Take 100 Units by mouth., Disp: , Rfl:    Zinc 100 MG TABS, 1 tablet Orally Once a day; Duration: 30 day(s), Disp: , Rfl:    docusate sodium (COLACE) 100 MG capsule, Take 100 mg by mouth daily as needed. (Patient not taking: Reported on 01/04/2024), Disp: , Rfl:    glimepiride (AMARYL) 2 MG tablet, Take 2 mg by mouth daily. (Patient not taking: Reported on 03/11/2024), Disp: , Rfl:    metFORMIN  (GLUCOPHAGE -XR) 500 MG 24 hr tablet, Take 2 tablets (1,000 mg total) by mouth  every morning. (Patient not taking: Reported on 03/11/2024), Disp: 180 tablet, Rfl: 3   Multiple Minerals-Vitamins (CALCIUM-MAGNESIUM-ZINC-D3 PO), Take by mouth. (Patient not taking: Reported on 03/11/2024), Disp: , Rfl:    Multiple Vitamins-Minerals (MULTIVITAMIN WOMEN PO), Take by mouth. (Patient not taking: Reported on 03/11/2024), Disp: , Rfl:    nabumetone (RELAFEN) 750 MG tablet, Take 750 mg by mouth daily as needed. (Patient not taking: Reported on 03/11/2024), Disp: , Rfl:  Medication Side Effects: none  Family Medical/ Social History: Changes? No  MENTAL HEALTH EXAM:  There were no vitals taken for this visit.There is no height or weight on file to calculate BMI.  General Appearance: Casual and Well Groomed  Eye Contact:  Good  Speech:  Clear and Coherent and Normal Rate  Volume:  Normal  Mood:  Euthymic  Affect:  Congruent  Thought Process:  Goal Directed and Descriptions of Associations: Circumstantial  Orientation:  Full (Time, Place, and Person)  Thought Content: Logical   Suicidal Thoughts:  No  Homicidal Thoughts:  No  Memory:  WNL  Judgement:  Good  Insight:  Good  Psychomotor Activity:  Normal  Concentration:  Concentration: Good and Attention Span: Good  Recall:  Good  Fund of Knowledge: Good  Language: Good  Assets:  Communication Skills Desire for Improvement Financial Resources/Insurance Housing Leisure Time Resilience Social Support Transportation Vocational/Educational  ADL's:  Intact  Cognition: WNL  Prognosis:  Good   PCP follows labs.  DIAGNOSES:    ICD-10-CM   1. Generalized anxiety disorder  F41.1     2. Mood disorder due to known physiological condition with mixed features  F06.34     3. Insomnia, unspecified type  G47.00      Receiving Psychotherapy: Yes   RECOMMENDATIONS:  PDMP reviewed.  Jornay PM  filled 02/20/2024.  Modafinil  filled 02/15/2024.  Ativan  filled 01/05/2024. I provided approximately 25 minutes of face to face time during this  encounter, including time spent before and after the visit in records review, medical decision making, counseling pertinent to today's visit, and charting.   She's doing well so no changes need to be made. She will get labs drawn tomorrow or the next day.  She understands the importance.   We discussed sleep hygiene. She can try taking the 1/2 Trazodone  (50 mg) in the middle of the night to see if that will help her get back to sleep but not make her too drowsy in the mornings.   Continue Rexulti  3 mg, 1 p.o. daily. Continue lithium  300 mg, 2 p.o. nightly. Continue Ativan  0.5 mg, 1/2-1 p.o. twice daily as needed.  Take as sparingly as possible. Continue Jornay PM , 100 mg at bedtime. Continue modafinil  200 mg, 1 p.o. every morning and 1 p.o. midday prn. Continue trazodone  100 mg, 1.5 to 2 pills nightly  as needed. Continue therapy. Return in 3 months.   Verneita Cooks, PA-C

## 2024-03-16 LAB — LITHIUM LEVEL: Lithium Lvl: 0.7 mmol/L (ref 0.5–1.2)

## 2024-03-18 ENCOUNTER — Ambulatory Visit: Payer: Self-pay | Admitting: Physician Assistant

## 2024-03-18 ENCOUNTER — Telehealth: Payer: Self-pay | Admitting: Physician Assistant

## 2024-03-18 NOTE — Telephone Encounter (Signed)
 Dorothyann from [facility] called to get some clarifications on pt script. CB [phone]

## 2024-03-18 NOTE — Progress Notes (Signed)
 Lithium  level is good.  Cont same dose

## 2024-03-18 NOTE — Telephone Encounter (Signed)
 The requested information was provided, last date seen, frequency seen, and dx for Jornay and modafinil . Spoke with Norman.

## 2024-05-08 ENCOUNTER — Telehealth: Payer: Self-pay

## 2024-05-08 NOTE — Telephone Encounter (Signed)
 Received request for PA on Rexulti , submitted through [employer] and response is medication is a covered medication.

## 2024-06-10 ENCOUNTER — Encounter: Payer: Self-pay | Admitting: Physician Assistant

## 2024-06-10 ENCOUNTER — Ambulatory Visit (INDEPENDENT_AMBULATORY_CARE_PROVIDER_SITE_OTHER): Admitting: Physician Assistant

## 2024-06-10 DIAGNOSIS — F411 Generalized anxiety disorder: Secondary | ICD-10-CM

## 2024-06-10 DIAGNOSIS — G4700 Insomnia, unspecified: Secondary | ICD-10-CM | POA: Diagnosis not present

## 2024-06-10 DIAGNOSIS — G3184 Mild cognitive impairment, so stated: Secondary | ICD-10-CM

## 2024-06-10 DIAGNOSIS — F0634 Mood disorder due to known physiological condition with mixed features: Secondary | ICD-10-CM

## 2024-06-10 MED ORDER — JORNAY PM 100 MG PO CP24: 100.0000 mg | ORAL_CAPSULE | Freq: Every evening | ORAL | 0 refills | Status: AC

## 2024-06-10 NOTE — Progress Notes (Signed)
 [facility]  Patient ID: Sheila James,  MRN: [id number]  PCP: Default, Provider, MD  Date of Evaluation: 06/10/2024 Time spent:20 minutes  Chief Complaint:  Chief Complaint   Anxiety; ADD; Insomnia; Depression; Follow-up    HISTORY/CURRENT STATUS: HPI  For routine med check.  I don't want to change but the Vyvanse  worked better. It's more expensive with her insurance.  About half of the time, she falls asleep and forgets to take the jornay.  Her biggest problem is getting going in and out of bed each morning.  It is a lot worse when she does not take the jornay.  The modafinil  does help her get going though.  She also has a hard time focusing.  She is doing well as far as her medications go.  She is able to enjoy things.  Energy and motivation are good.  Work is going well.  She works at her regular job plus [employer].  Denies sadness, tearfulness, or feelings of hopelessness.  Sleeps well most of the time.  Takes the trazodone  which helps.  ADLs and personal hygiene are normal.   Appetite has not changed.  Weight is stable.  Denies laxative use, calorie restricting, or binging and purging.   Denies cutting or any form of self-harm.  Anxiety is controlled.  She takes the Ativan  almost every day.  She has been on this regimen with the jornay, modafinil , and Ativan  for a while.  No mania, delirium, AH/VH.  No SI/HI.  Individual Medical History/ Review of Systems: Changes? :No     Past Psychiatric Medication Trials: Wellbutrin - allergic reaction. Sertraline  Prozac  Buspar  Lamictal - Some improvement in depressive s/s outside of pre-menstrual time, memory decreased Latuda - Caused increased glucose and cholesterol levels. Caused sleepiness Abilify - helpful for depression. Wt. Gain.  Risperdal  Geodon  Adderall  XR-only lasted 6 hours. Vyvanse  Concerta - Ineffective (short duration and minimal improvement) Modafinil  Melatonin- Ineffective Trazodone  Doxepin - not helpful  for sleep initiation  Allergies: Wellbutrin  xl [bupropion ]  Current Medications:  Current Outpatient Medications:    B Complex Vitamins (VITAMIN-B COMPLEX PO), Take by mouth., Disp: , Rfl:    Brexpiprazole  (REXULTI ) 3 MG TABS, Take 1 tablet (3 mg total) by mouth daily., Disp: 30 tablet, Rfl: 5   etonogestrel (NEXPLANON) 68 MG IMPL implant, 1 each by Subdermal route once., Disp: , Rfl:    ferrous sulfate 325 (65 FE) MG tablet, Take 325 mg by mouth 2 (two) times daily with a meal., Disp: , Rfl:    ibuprofen (ADVIL) 200 MG tablet, Take 200 mg by mouth every 6 (six) hours as needed., Disp: , Rfl:    lisinopril (ZESTRIL) 10 MG tablet, Take 10 mg by mouth daily., Disp: , Rfl:    lithium  carbonate 300 MG capsule, Take 2 capsules (600 mg total) by mouth at bedtime., Disp: 60 capsule, Rfl: 5   LORazepam  (ATIVAN ) 0.5 MG tablet, Take 1/2- tab po BID prn, Disp: 30 tablet, Rfl: 5   Magnesium 250 MG TABS, 1 tablet with a meal Orally Once a day; Duration: 30 day(s), Disp: , Rfl:    modafinil  (PROVIGIL ) 200 MG tablet, Take 1 tablet in the morning and mid-day, Disp: 60 tablet, Rfl: 5   MOUNJARO 2.5 MG/0.5ML Pen, Inject 2.5 mg into the skin once a week., Disp: , Rfl:    naproxen (EC NAPROSYN) 500 MG EC tablet, Take 500 mg by mouth as needed., Disp: , Rfl:    Oyster Shell Calcium 500 MG TABS, 1 tablet with meals Orally Twice a  day; Duration: 30 day(s), Disp: , Rfl:    traZODone  (DESYREL ) 100 MG tablet, Take 1.5-2 tabs po QHS prn insomnia, Disp: 60 tablet, Rfl: 5   VITAMIN D, CHOLECALCIFEROL, PO, Take by mouth., Disp: , Rfl:    Zinc 100 MG TABS, 1 tablet Orally Once a day; Duration: 30 day(s), Disp: , Rfl:    docusate sodium (COLACE) 100 MG capsule, Take 100 mg by mouth daily as needed. (Patient not taking: Reported on 01/04/2024), Disp: , Rfl:    glimepiride (AMARYL) 2 MG tablet, Take 2 mg by mouth daily. (Patient not taking: Reported on 06/10/2024), Disp: , Rfl:    metFORMIN  (GLUCOPHAGE -XR) 500 MG 24 hr tablet,  Take 2 tablets (1,000 mg total) by mouth every morning. (Patient not taking: Reported on 06/10/2024), Disp: 180 tablet, Rfl: 3   Methylphenidate  HCl ER, PM, (JORNAY PM ) 100 MG CP24, Take 1 capsule (100 mg total) by mouth at bedtime., Disp: 30 capsule, Rfl: 0   [START ON 07/10/2024] Methylphenidate  HCl ER, PM, (JORNAY PM ) 100 MG CP24, Take 1 capsule (100 mg total) by mouth at bedtime., Disp: 30 capsule, Rfl: 0   [START ON 08/09/2024] Methylphenidate  HCl ER, PM, (JORNAY PM ) 100 MG CP24, Take 1 capsule (100 mg total) by mouth at bedtime., Disp: 30 capsule, Rfl: 0   Multiple Minerals-Vitamins (CALCIUM-MAGNESIUM-ZINC-D3 PO), Take by mouth. (Patient not taking: Reported on 03/11/2024), Disp: , Rfl:    Multiple Vitamins-Minerals (MULTIVITAMIN WOMEN PO), Take by mouth. (Patient not taking: Reported on 03/11/2024), Disp: , Rfl:    nabumetone (RELAFEN) 750 MG tablet, Take 750 mg by mouth daily as needed. (Patient not taking: Reported on 03/11/2024), Disp: , Rfl:    Vitamin E 45 MG (100 UNIT) CAPS, Take 100 Units by mouth. (Patient not taking: Reported on 06/10/2024), Disp: , Rfl:  Medication Side Effects: none  Family Medical/ Social History: Changes? No  MENTAL HEALTH EXAM:  There were no vitals taken for this visit.There is no height or weight on file to calculate BMI.  General Appearance: Casual and Well Groomed  Eye Contact:  Good  Speech:  Clear and Coherent and Normal Rate  Volume:  Normal  Mood:  Euthymic  Affect:  Congruent  Thought Process:  Goal Directed and Descriptions of Associations: Circumstantial  Orientation:  Full (Time, Place, and Person)  Thought Content: Logical   Suicidal Thoughts:  No  Homicidal Thoughts:  No  Memory:  WNL  Judgement:  Good  Insight:  Good  Psychomotor Activity:  Normal  Concentration:  Concentration: Good and Attention Span: Good  Recall:  Good  Fund of Knowledge: Good  Language: Good  Assets:  Communication Skills Desire for Improvement Financial  Resources/Insurance Housing Resilience Social Support Transportation Vocational/Educational  ADL's:  Intact  Cognition: WNL  Prognosis:  Good   PCP follows labs.  DIAGNOSES:    ICD-10-CM   1. Mood disorder due to known physiological condition with mixed features  F06.34     2. Generalized anxiety disorder  F41.1     3. Insomnia, unspecified type  G47.00       Receiving Psychotherapy: Yes   RECOMMENDATIONS:  PDMP reviewed.  Jornay PM  filled 04/26/2024.  Modafinil  filled 05/25/2024.  Ativan  filled 05/31/2024. I provided approximately 20 minutes of face to face time during this encounter, including time spent before and after the visit in records review, medical decision making, counseling pertinent to today's visit, and charting.   If she ever wants to change to Vyvanse  she can let me  know.  She does not want to change at this time however.  Continue Rexulti  3 mg, 1 p.o. daily. Continue lithium  300 mg, 2 p.o. nightly. Continue Ativan  0.5 mg, 1/2-1 p.o. twice daily as needed.  Take as sparingly as possible. Continue Jornay PM , 100 mg at bedtime. Continue modafinil  200 mg, 1 p.o. every morning and 1 p.o. midday prn. Continue trazodone  100 mg, 1.5 to 2 pills nightly as needed. Continue therapy. Return in 3 months.   Verneita Cooks, PA-C

## 2024-07-08 ENCOUNTER — Telehealth: Payer: Self-pay

## 2024-07-08 NOTE — Telephone Encounter (Signed)
 Pharmacy called to report the manufacturer for lithium  has changed. It was [employer], is now [employer].

## 2024-09-09 ENCOUNTER — Ambulatory Visit (INDEPENDENT_AMBULATORY_CARE_PROVIDER_SITE_OTHER): Admitting: Physician Assistant
